# Patient Record
Sex: Male | Born: 1940 | Race: Black or African American | Hispanic: No | Marital: Single | Smoking: Former smoker
Health system: Southern US, Community
[De-identification: ages and names within clinical notes are randomized; demographics above are authoritative.]

## PROBLEM LIST (undated history)

## (undated) DIAGNOSIS — I10 Essential (primary) hypertension: Secondary | ICD-10-CM

## (undated) DIAGNOSIS — E785 Hyperlipidemia, unspecified: Secondary | ICD-10-CM

## (undated) DIAGNOSIS — N400 Enlarged prostate without lower urinary tract symptoms: Secondary | ICD-10-CM

## (undated) DIAGNOSIS — I639 Cerebral infarction, unspecified: Secondary | ICD-10-CM

## (undated) DIAGNOSIS — I2510 Atherosclerotic heart disease of native coronary artery without angina pectoris: Secondary | ICD-10-CM

## (undated) HISTORY — DX: Essential (primary) hypertension: I10

## (undated) HISTORY — DX: Benign prostatic hyperplasia without lower urinary tract symptoms: N40.0

## (undated) HISTORY — DX: Atherosclerotic heart disease of native coronary artery without angina pectoris: I25.10

## (undated) HISTORY — DX: Hyperlipidemia, unspecified: E78.5

## (undated) HISTORY — DX: Cerebral infarction, unspecified: I63.9

## (undated) HISTORY — PX: HERNIA REPAIR: SHX51

---

## 1999-05-18 HISTORY — PX: CORONARY ARTERY BYPASS GRAFT: SHX141

## 2001-12-12 ENCOUNTER — Emergency Department (HOSPITAL_COMMUNITY): Admission: EM | Admit: 2001-12-12 | Discharge: 2001-12-12 | Payer: Self-pay | Admitting: Emergency Medicine

## 2001-12-12 ENCOUNTER — Encounter: Payer: Self-pay | Admitting: Emergency Medicine

## 2002-03-21 ENCOUNTER — Encounter: Payer: Self-pay | Admitting: Neurology

## 2002-03-21 ENCOUNTER — Encounter: Payer: Self-pay | Admitting: Emergency Medicine

## 2002-03-21 ENCOUNTER — Inpatient Hospital Stay (HOSPITAL_COMMUNITY): Admission: EM | Admit: 2002-03-21 | Discharge: 2002-03-26 | Payer: Self-pay | Admitting: Emergency Medicine

## 2002-03-22 ENCOUNTER — Encounter: Payer: Self-pay | Admitting: Neurology

## 2002-03-22 ENCOUNTER — Encounter (INDEPENDENT_AMBULATORY_CARE_PROVIDER_SITE_OTHER): Payer: Self-pay | Admitting: Cardiology

## 2002-03-26 ENCOUNTER — Inpatient Hospital Stay (HOSPITAL_COMMUNITY)
Admission: RE | Admit: 2002-03-26 | Discharge: 2002-04-04 | Payer: Self-pay | Admitting: Physical Medicine & Rehabilitation

## 2002-06-07 ENCOUNTER — Encounter
Admission: RE | Admit: 2002-06-07 | Discharge: 2002-07-18 | Payer: Self-pay | Admitting: Physical Medicine & Rehabilitation

## 2002-12-10 ENCOUNTER — Encounter: Payer: Self-pay | Admitting: *Deleted

## 2002-12-10 ENCOUNTER — Inpatient Hospital Stay (HOSPITAL_COMMUNITY): Admission: EM | Admit: 2002-12-10 | Discharge: 2002-12-21 | Payer: Self-pay | Admitting: *Deleted

## 2002-12-15 ENCOUNTER — Encounter: Payer: Self-pay | Admitting: *Deleted

## 2002-12-17 ENCOUNTER — Encounter: Payer: Self-pay | Admitting: Surgery

## 2002-12-18 ENCOUNTER — Encounter: Payer: Self-pay | Admitting: Surgery

## 2002-12-19 ENCOUNTER — Encounter: Payer: Self-pay | Admitting: Surgery

## 2004-01-22 ENCOUNTER — Ambulatory Visit: Payer: Self-pay | Admitting: *Deleted

## 2004-04-01 ENCOUNTER — Ambulatory Visit: Payer: Self-pay | Admitting: Family Medicine

## 2004-07-02 ENCOUNTER — Ambulatory Visit: Payer: Self-pay | Admitting: Family Medicine

## 2004-07-02 ENCOUNTER — Ambulatory Visit: Payer: Self-pay | Admitting: *Deleted

## 2004-08-05 ENCOUNTER — Ambulatory Visit: Payer: Self-pay | Admitting: Family Medicine

## 2004-10-14 ENCOUNTER — Ambulatory Visit: Payer: Self-pay | Admitting: Family Medicine

## 2005-01-28 ENCOUNTER — Ambulatory Visit: Payer: Self-pay | Admitting: Family Medicine

## 2005-03-09 ENCOUNTER — Ambulatory Visit: Payer: Self-pay | Admitting: Family Medicine

## 2005-04-12 ENCOUNTER — Ambulatory Visit: Payer: Self-pay | Admitting: Family Medicine

## 2005-08-27 ENCOUNTER — Ambulatory Visit: Payer: Self-pay | Admitting: Family Medicine

## 2006-01-31 ENCOUNTER — Ambulatory Visit: Payer: Self-pay | Admitting: Family Medicine

## 2006-07-06 ENCOUNTER — Ambulatory Visit: Payer: Self-pay | Admitting: Family Medicine

## 2006-10-29 ENCOUNTER — Ambulatory Visit (HOSPITAL_COMMUNITY): Admission: RE | Admit: 2006-10-29 | Discharge: 2006-10-29 | Payer: Self-pay | Admitting: Chiropractic Medicine

## 2006-11-01 ENCOUNTER — Ambulatory Visit (HOSPITAL_COMMUNITY): Admission: RE | Admit: 2006-11-01 | Discharge: 2006-11-01 | Payer: Self-pay | Admitting: Chiropractic Medicine

## 2007-12-13 ENCOUNTER — Encounter (INDEPENDENT_AMBULATORY_CARE_PROVIDER_SITE_OTHER): Payer: Self-pay | Admitting: Family Medicine

## 2007-12-13 ENCOUNTER — Ambulatory Visit: Payer: Self-pay | Admitting: Internal Medicine

## 2007-12-13 LAB — CONVERTED CEMR LAB
ALT: 15 units/L (ref 0–53)
AST: 17 units/L (ref 0–37)
Alkaline Phosphatase: 70 units/L (ref 39–117)
Basophils Absolute: 0.1 10*3/uL (ref 0.0–0.1)
Basophils Relative: 1 % (ref 0–1)
CO2: 21 meq/L (ref 19–32)
Calcium: 9.4 mg/dL (ref 8.4–10.5)
Eosinophils Relative: 3 % (ref 0–5)
HDL: 43 mg/dL (ref >39)
Lymphocytes Relative: 38 % (ref 12–46)
PSA: 0.3 ng/mL (ref 0.10–4.00)
Platelets: 256 10*3/uL (ref 150–400)
RDW: 16.5 % — ABNORMAL HIGH (ref 11.5–15.5)
Sodium: 142 meq/L (ref 135–145)
Total Bilirubin: 0.3 mg/dL (ref 0.3–1.2)
Total CHOL/HDL Ratio: 4.8
Total Protein: 7.2 g/dL (ref 6.0–8.3)
VLDL: 29 mg/dL (ref 0–40)
WBC: 7 10*3/uL (ref 4.0–10.5)

## 2008-04-30 ENCOUNTER — Ambulatory Visit: Payer: Self-pay | Admitting: Internal Medicine

## 2008-06-07 ENCOUNTER — Encounter (INDEPENDENT_AMBULATORY_CARE_PROVIDER_SITE_OTHER): Payer: Self-pay | Admitting: Adult Health

## 2008-06-07 ENCOUNTER — Ambulatory Visit: Payer: Self-pay | Admitting: Internal Medicine

## 2008-06-07 LAB — CONVERTED CEMR LAB
ALT: 13 units/L (ref 0–53)
AST: 17 units/L (ref 0–37)
Albumin: 4.6 g/dL (ref 3.5–5.2)
Basophils Relative: 1 % (ref 0–1)
Calcium: 9.6 mg/dL (ref 8.4–10.5)
Cholesterol: 210 mg/dL — ABNORMAL HIGH (ref 0–200)
Creatinine, Ser: 0.91 mg/dL (ref 0.40–1.50)
Eosinophils Absolute: 0.1 10*3/uL (ref 0.0–0.7)
MCHC: 32.3 g/dL (ref 30.0–36.0)
MCV: 72 fL — ABNORMAL LOW (ref 78.0–100.0)
Monocytes Relative: 13 % — ABNORMAL HIGH (ref 3–12)
Neutrophils Relative %: 48 % (ref 43–77)
RBC: 6.49 M/uL — ABNORMAL HIGH (ref 4.22–5.81)
Sodium: 141 meq/L (ref 135–145)
Total Protein: 7.7 g/dL (ref 6.0–8.3)

## 2008-06-12 ENCOUNTER — Encounter (INDEPENDENT_AMBULATORY_CARE_PROVIDER_SITE_OTHER): Payer: Self-pay | Admitting: Internal Medicine

## 2008-08-20 ENCOUNTER — Ambulatory Visit: Payer: Self-pay | Admitting: Internal Medicine

## 2009-09-09 ENCOUNTER — Encounter (INDEPENDENT_AMBULATORY_CARE_PROVIDER_SITE_OTHER): Payer: Self-pay | Admitting: Adult Health

## 2009-09-09 ENCOUNTER — Ambulatory Visit: Payer: Self-pay | Admitting: Internal Medicine

## 2009-09-09 LAB — CONVERTED CEMR LAB
BUN: 17 mg/dL (ref 6–23)
Chloride: 103 meq/L (ref 96–112)
Creatinine, Ser: 0.99 mg/dL (ref 0.40–1.50)
Glucose, Bld: 82 mg/dL (ref 70–99)
HDL: 42 mg/dL (ref >39)
LDL Cholesterol: 165 mg/dL — ABNORMAL HIGH (ref 0–99)
PSA: 0.36 ng/mL (ref 0.10–4.00)
Potassium: 4.3 meq/L (ref 3.5–5.3)
Total CHOL/HDL Ratio: 5.3

## 2010-07-15 ENCOUNTER — Telehealth (INDEPENDENT_AMBULATORY_CARE_PROVIDER_SITE_OTHER): Payer: Self-pay | Admitting: *Deleted

## 2010-07-17 ENCOUNTER — Other Ambulatory Visit: Payer: Medicare Other

## 2010-07-17 ENCOUNTER — Encounter: Payer: Self-pay | Admitting: Internal Medicine

## 2010-07-17 ENCOUNTER — Ambulatory Visit (INDEPENDENT_AMBULATORY_CARE_PROVIDER_SITE_OTHER): Payer: Medicare Other | Admitting: Internal Medicine

## 2010-07-17 ENCOUNTER — Other Ambulatory Visit: Payer: Self-pay | Admitting: Internal Medicine

## 2010-07-17 DIAGNOSIS — N400 Enlarged prostate without lower urinary tract symptoms: Secondary | ICD-10-CM

## 2010-07-17 DIAGNOSIS — Z8679 Personal history of other diseases of the circulatory system: Secondary | ICD-10-CM

## 2010-07-17 DIAGNOSIS — I10 Essential (primary) hypertension: Secondary | ICD-10-CM

## 2010-07-17 DIAGNOSIS — E785 Hyperlipidemia, unspecified: Secondary | ICD-10-CM | POA: Insufficient documentation

## 2010-07-17 DIAGNOSIS — I2510 Atherosclerotic heart disease of native coronary artery without angina pectoris: Secondary | ICD-10-CM

## 2010-07-17 DIAGNOSIS — Z23 Encounter for immunization: Secondary | ICD-10-CM

## 2010-07-17 LAB — HEPATIC FUNCTION PANEL
ALT: 18 U/L (ref 0–53)
AST: 20 U/L (ref 0–37)
Albumin: 3.6 g/dL (ref 3.5–5.2)
Total Bilirubin: 0.4 mg/dL (ref 0.3–1.2)
Total Protein: 6.1 g/dL (ref 6.0–8.3)

## 2010-07-17 LAB — CBC WITH DIFFERENTIAL/PLATELET
Basophils Relative: 1.8 % (ref 0.0–3.0)
Eosinophils Relative: 3.6 % (ref 0.0–5.0)
HCT: 40.3 % (ref 39.0–52.0)
Hemoglobin: 12.9 g/dL — ABNORMAL LOW (ref 13.0–17.0)
Lymphs Abs: 2.6 10*3/uL (ref 0.7–4.0)
MCV: 74.3 fl — ABNORMAL LOW (ref 78.0–100.0)
Monocytes Absolute: 1.1 10*3/uL — ABNORMAL HIGH (ref 0.1–1.0)
Monocytes Relative: 14.8 % — ABNORMAL HIGH (ref 3.0–12.0)
Neutro Abs: 3.2 10*3/uL (ref 1.4–7.7)
Platelets: 270 10*3/uL (ref 150.0–400.0)
WBC: 7.3 10*3/uL (ref 4.5–10.5)

## 2010-07-17 LAB — URINALYSIS, ROUTINE W REFLEX MICROSCOPIC
Bilirubin Urine: NEGATIVE
Ketones, ur: NEGATIVE
Leukocytes, UA: NEGATIVE
Specific Gravity, Urine: 1.02 (ref 1.000–1.030)
Urine Glucose: NEGATIVE
Urobilinogen, UA: 0.2 (ref 0.0–1.0)

## 2010-07-17 LAB — CONVERTED CEMR LAB
HDL goal, serum: 40 mg/dL
LDL Goal: 70 mg/dL

## 2010-07-17 LAB — TSH: TSH: 0.57 u[IU]/mL (ref 0.35–5.50)

## 2010-07-17 LAB — BASIC METABOLIC PANEL
BUN: 8 mg/dL (ref 6–23)
Chloride: 112 mEq/L (ref 96–112)
GFR: 90.29 mL/min (ref >60.00)
Potassium: 4.6 mEq/L (ref 3.5–5.1)
Sodium: 146 mEq/L — ABNORMAL HIGH (ref 135–145)

## 2010-07-17 LAB — PSA: PSA: 0.45 ng/mL (ref 0.10–4.00)

## 2010-07-23 NOTE — Letter (Signed)
Summary: Lipid Letter  [facility]  [address] [place], [place] [address]   Phone: [phone]  Fax: [phone]    07/17/2010  Ian [name] [address] [place], [place]  [address]  Dear Ian [name]:  We have carefully reviewed your last lipid profile from 07/17/2010 and the results are noted below with a summary of recommendations for lipid management.    Cholesterol:       164     Goal: <200   HDL "good" Cholesterol:   13.24     Goal: >40   LDL "bad" Cholesterol:   96     Goal: <70   Triglycerides:       136.0     Goal: <150    you are mildly anemic, please follow-up soon!    TLC Diet (Therapeutic Lifestyle Change): Saturated Fats & Transfatty acids should be kept < 7% of total calories ***Reduce Saturated Fats Polyunstaurated Fat can be up to 10% of total calories Monounsaturated Fat Fat can be up to 20% of total calories Total Fat should be no greater than 25-35% of total calories Carbohydrates should be 50-60% of total calories Protein should be approximately 15% of total calories Fiber should be at least 20-30 grams a day ***Increased fiber may help lower LDL Total Cholesterol should be < 200mg /day Consider adding plant stanol/sterols to diet (example: Benacol spread) ***A higher intake of unsaturated fat may reduce Triglycerides and Increase HDL    Adjunctive Measures (may lower LIPIDS and reduce risk of Heart Attack) include: Aerobic Exercise (20-30 minutes 3-4 times a week) Limit Alcohol Consumption Weight Reduction Aspirin 75-81 mg a day by mouth (if not allergic or contraindicated) Dietary Fiber 20-30 grams a day by mouth     Current Medications: 1)    Lisinopril 20 Mg Tabs (Lisinopril) .Marland Kitchen.. 1 tablet once daily 2)    Hydrochlorothiazide 25 Mg Tabs (Hydrochlorothiazide) .Marland Kitchen.. 1 tablet once daily 3)    Plavix 75 Mg Tabs (Clopidogrel bisulfate) .Marland Kitchen.. 1 tablet once daily 4)    Carvedilol 6.25 Mg Tabs (Carvedilol) .Marland Kitchen.. 1 tablet two times a day 5)     Simvastatin 40 Mg Tabs (Simvastatin) .Marland Kitchen.. 1 tablet once a day 6)    Fluoxetine Hcl 20 Mg Tabs (Fluoxetine hcl) .Marland Kitchen.. 1 tablet once daily  If you have any questions, please call. We appreciate being able to work with you.   Sincerely,    [facility] Brenton Grills CMA (AAMA)

## 2010-07-23 NOTE — Progress Notes (Signed)
  Phone Note Other Incoming   Request: Send information Summary of Call: Received patients records from [facility], 46pgs sent to Dr. Yetta Barre.

## 2010-07-23 NOTE — Assessment & Plan Note (Signed)
Summary: new/medicare/#/cd   Vital Signs:  Patient profile:   70 year old male Height:      70 inches Weight:      208 pounds BMI:     29.95 O2 Sat:      97 % on Room air Temp:     97.8 degrees F oral Pulse rate:   52 / minute Pulse rhythm:   regular Resp:     16 per minute BP sitting:   138 / 80  (left arm) Cuff size:   large  Vitals Entered By: Bill Salinas CMA (July 17, 2010 2:22 PM)  Nutrition Counseling: Patient's BMI is greater than 25 and therefore counseled on weight management options.  O2 Flow:  Room air  Primary Care Provider:  Yetta Barre   History of Present Illness: New to me he comes in today for a routine f/up - he feels well and offers no complaints. He is with his daughter today and they tell me that he had a CABG about 10 years ago and he says that he has not had any cardiology f/up since then.   Hypertension History:      He denies headache, chest pain, palpitations, dyspnea with exertion, orthopnea, PND, peripheral edema, visual symptoms, neurologic problems, syncope, and side effects from treatment.        Positive major cardiovascular risk factors include male age 90 years old or older, hyperlipidemia, hypertension, and current tobacco user.  Negative major cardiovascular risk factors include no history of diabetes and negative family history for ischemic heart disease.        Positive history for target organ damage include ASHD (either angina/prior MI/prior CABG) and prior stroke (or TIA).  Further assessment for target organ damage reveals no history of cardiac end-organ damage (CHF/LVH), peripheral vascular disease, renal insufficiency, or hypertensive retinopathy.    Lipid Management History:      Positive NCEP/ATP III risk factors include male age 93 years old or older, current tobacco user, hypertension, ASHD (either angina/prior MI/prior CABG), and prior stroke (or TIA).  Negative NCEP/ATP III risk factors include non-diabetic, no family history for  ischemic heart disease, no peripheral vascular disease, and no history of aortic aneurysm.        The patient states that he knows about the "Therapeutic Lifestyle Change" diet.  His compliance with the TLC diet is not at all.  The patient expresses understanding of adjunctive measures for cholesterol lowering.  Adjunctive measures started by the patient include fiber, ASA, and weight reduction.  He expresses no side effects from his lipid-lowering medication.  The patient denies any symptoms to suggest myopathy or liver disease.      Preventive Screening-Counseling & Management  Alcohol-Tobacco     Alcohol drinks/day: 2     Alcohol type: spirits     >5/day in last 3 mos: no     Alcohol Counseling: to decrease amount and/or frequency of alcohol intake     Feels need to cut down: no     Feels annoyed by complaints: no     Feels guilty re: drinking: no     Needs 'eye opener' in am: no     Smoking Status: current     Smoking Cessation Counseling: yes     Smoke Cessation Stage: precontemplative     Packs/Day: 0.5     Year Started: 1964     Pack years: 20     Tobacco Counseling: to quit use of tobacco products  Caffeine-Diet-Exercise  Does Patient Exercise: no  Hep-HIV-STD-Contraception     Hepatitis Risk: no risk noted     HIV Risk: no risk noted     STD Risk: no risk noted      Drug Use:  no.    Current Medications (verified): 1)  Lisinopril 20 Mg Tabs (Lisinopril) .Marland Kitchen.. 1 Tablet Once Daily 2)  Hydrochlorothiazide 25 Mg Tabs (Hydrochlorothiazide) .Marland Kitchen.. 1 Tablet Once Daily 3)  Plavix 75 Mg Tabs (Clopidogrel Bisulfate) .Marland Kitchen.. 1 Tablet Once Daily 4)  Carvedilol 6.25 Mg Tabs (Carvedilol) .Marland Kitchen.. 1 Tablet Two Times A Day 5)  Simvastatin 40 Mg Tabs (Simvastatin) .Marland Kitchen.. 1 Tablet Once A Day 6)  Fluoxetine Hcl 20 Mg Tabs (Fluoxetine Hcl) .Marland Kitchen.. 1 Tablet Once Daily  Allergies (verified): No Known Drug Allergies  Past History:  Past Medical History: Coronary artery  disease Hyperlipidemia Hypertension Benign prostatic hypertrophy Cerebrovascular accident, hx of  Past Surgical History: Coronary artery bypass graft about 2001  Family History: Family History of Arthritis Family History Hypertension Family History of Stroke M 1st degree relative <50  Social History: Single Divorced Current Smoker Alcohol use-yes Drug use-no Regular exercise-no Smoking Status:  current Packs/Day:  0.5 Hepatitis Risk:  no risk noted HIV Risk:  no risk noted STD Risk:  no risk noted Drug Use:  no Does Patient Exercise:  no  Review of Systems       The patient complains of weight gain.  The patient denies anorexia, fever, weight loss, chest pain, syncope, dyspnea on exertion, peripheral edema, prolonged cough, headaches, hemoptysis, abdominal pain, melena, hematochezia, severe indigestion/heartburn, hematuria, genital sores, muscle weakness, suspicious skin lesions, transient blindness, difficulty walking, depression, unusual weight change, abnormal bleeding, enlarged lymph nodes, angioedema, and testicular masses.   GU:  Denies decreased libido, discharge, dysuria, erectile dysfunction, genital sores, hematuria, incontinence, nocturia, urinary frequency, and urinary hesitancy.  Physical Exam  General:  alert, well-developed, well-nourished, well-hydrated, appropriate dress, normal appearance, healthy-appearing, cooperative to examination, good hygiene, and overweight-appearing.   Head:  normocephalic, atraumatic, no abnormalities observed, and no abnormalities palpated.   Eyes:  vision grossly intact, pupils equal, and pupils round.   Ears:  External ear exam shows no significant lesions or deformities.  Otoscopic examination reveals clear canals, tympanic membranes are intact bilaterally without bulging, retraction, inflammation or discharge. Hearing is grossly normal bilaterally. Nose:  External nasal examination shows no deformity or inflammation. Nasal  mucosa are pink and moist without lesions or exudates. Mouth:  Oral mucosa and oropharynx without lesions or exudates.  Teeth in good repair. Neck:  supple, full ROM, no masses, no thyromegaly, no thyroid nodules or tenderness, no JVD, normal carotid upstroke, no carotid bruits, no cervical lymphadenopathy, and no neck tenderness.   Chest Wall:  sternotomy scar.   Lungs:  normal respiratory effort, no intercostal retractions, no accessory muscle use, normal breath sounds, no dullness, no fremitus, no crackles, and no wheezes.   Heart:  normal rate, regular rhythm, no murmur, no gallop, no rub, and no JVD.   Abdomen:  soft, non-tender, normal bowel sounds, no distention, no masses, no guarding, no rigidity, no rebound tenderness, no abdominal hernia, no inguinal hernia, no hepatomegaly, and no splenomegaly.   Msk:  No deformity or scoliosis noted of thoracic or lumbar spine.   Pulses:  R and L carotid,radial,femoral,dorsalis pedis and posterior tibial pulses are full and equal bilaterally Extremities:  No clubbing, cyanosis, edema, or deformity noted with normal full range of motion of all joints.   Neurologic:  No  cranial nerve deficits noted. Station and gait are normal. Plantar reflexes are down-going bilaterally. DTRs are symmetrical throughout. Sensory, motor and coordinative functions appear intact. Skin:  Intact without suspicious lesions or rashes Cervical Nodes:  No lymphadenopathy noted Axillary Nodes:  No palpable lymphadenopathy Psych:  Oriented X3, normally interactive, good eye contact, not anxious appearing, not depressed appearing, not agitated, not suicidal, easily distracted, poor concentration, and memory impairment.     Impression & Recommendations:  Problem # 1:  HYPERTENSION (ICD-401.9) Assessment Improved  His updated medication list for this problem includes:    Lisinopril 20 Mg Tabs (Lisinopril) .Marland Kitchen... 1 tablet once daily    Hydrochlorothiazide 25 Mg Tabs  (Hydrochlorothiazide) .Marland Kitchen... 1 tablet once daily    Carvedilol 6.25 Mg Tabs (Carvedilol) .Marland Kitchen... 1 tablet two times a day  Orders: Venipuncture (16109) TLB-Lipid Panel (80061-LIPID) TLB-BMP (Basic Metabolic Panel-BMET) (80048-METABOL) TLB-CBC Platelet - w/Differential (85025-CBCD) TLB-Hepatic/Liver Function Pnl (80076-HEPATIC) TLB-TSH (Thyroid Stimulating Hormone) (84443-TSH) TLB-PSA (Prostate Specific Antigen) (84153-PSA) TLB-Udip w/ Micro (81001-URINE)  BP today: 138/80  10 Yr Risk Heart Disease: N/A  Labs Reviewed: K+: 4.3 (09/09/2009) Creat: : 0.99 (09/09/2009)   Chol: 221 (09/09/2009)   HDL: 42 (09/09/2009)   LDL: 165 (09/09/2009)   TG: 72 (09/09/2009)  Problem # 2:  CORONARY ARTERY DISEASE (ICD-414.00) Assessment: Unchanged  His updated medication list for this problem includes:    Lisinopril 20 Mg Tabs (Lisinopril) .Marland Kitchen... 1 tablet once daily    Hydrochlorothiazide 25 Mg Tabs (Hydrochlorothiazide) .Marland Kitchen... 1 tablet once daily    Plavix 75 Mg Tabs (Clopidogrel bisulfate) .Marland Kitchen... 1 tablet once daily    Carvedilol 6.25 Mg Tabs (Carvedilol) .Marland Kitchen... 1 tablet two times a day  Orders: Venipuncture (60454) TLB-Lipid Panel (80061-LIPID) TLB-BMP (Basic Metabolic Panel-BMET) (80048-METABOL) TLB-CBC Platelet - w/Differential (85025-CBCD) TLB-Hepatic/Liver Function Pnl (80076-HEPATIC) TLB-TSH (Thyroid Stimulating Hormone) (84443-TSH) TLB-PSA (Prostate Specific Antigen) (84153-PSA) TLB-Udip w/ Micro (81001-URINE) Cardiology Referral (Cardiology)  Labs Reviewed: Chol: 221 (09/09/2009)   HDL: 42 (09/09/2009)   LDL: 165 (09/09/2009)   TG: 72 (09/09/2009)  Lipid Goals: Chol Goal: 200 (07/17/2010)   HDL Goal: 40 (07/17/2010)   LDL Goal: 70 (07/17/2010)   TG Goal: 150 (07/17/2010)  Problem # 3:  BENIGN PROSTATIC HYPERTROPHY (ICD-600.00) Assessment: Unchanged  Orders: Venipuncture (09811) TLB-Lipid Panel (80061-LIPID) TLB-BMP (Basic Metabolic Panel-BMET) (80048-METABOL) TLB-CBC Platelet -  w/Differential (85025-CBCD) TLB-Hepatic/Liver Function Pnl (80076-HEPATIC) TLB-TSH (Thyroid Stimulating Hormone) (84443-TSH) TLB-PSA (Prostate Specific Antigen) (84153-PSA) TLB-Udip w/ Micro (81001-URINE)  PSA: 0.36 (09/09/2009)     Problem # 4:  HYPERLIPIDEMIA (ICD-272.4) Assessment: Unchanged  His updated medication list for this problem includes:    Simvastatin 40 Mg Tabs (Simvastatin) .Marland Kitchen... 1 tablet once a day  Orders: Venipuncture (91478) TLB-Lipid Panel (80061-LIPID) TLB-BMP (Basic Metabolic Panel-BMET) (80048-METABOL) TLB-CBC Platelet - w/Differential (85025-CBCD) TLB-Hepatic/Liver Function Pnl (80076-HEPATIC) TLB-TSH (Thyroid Stimulating Hormone) (84443-TSH) TLB-PSA (Prostate Specific Antigen) (84153-PSA) TLB-Udip w/ Micro (81001-URINE)  Labs Reviewed: SGOT: 18 (09/09/2009)   SGPT: 12 (09/09/2009)  Lipid Goals: Chol Goal: 200 (07/17/2010)   HDL Goal: 40 (07/17/2010)   LDL Goal: 70 (07/17/2010)   TG Goal: 150 (07/17/2010)  10 Yr Risk Heart Disease: N/A   HDL:42 (09/09/2009), 41 (06/07/2008)  LDL:165 (09/09/2009), 149 (06/07/2008)  Chol:221 (09/09/2009), 210 (06/07/2008)  Trig:72 (09/09/2009), 99 (06/07/2008)  Complete Medication List: 1)  Lisinopril 20 Mg Tabs (Lisinopril) .Marland Kitchen.. 1 tablet once daily 2)  Hydrochlorothiazide 25 Mg Tabs (Hydrochlorothiazide) .Marland Kitchen.. 1 tablet once daily 3)  Plavix 75 Mg Tabs (Clopidogrel bisulfate) .Marland Kitchen.. 1 tablet once daily 4)  Carvedilol 6.25 Mg Tabs (Carvedilol) .Marland Kitchen.. 1 tablet two times a day 5)  Simvastatin 40 Mg Tabs (Simvastatin) .Marland Kitchen.. 1 tablet once a day 6)  Fluoxetine Hcl 20 Mg Tabs (Fluoxetine hcl) .Marland Kitchen.. 1 tablet once daily  Other Orders: Tdap => 54yrs IM (16109) Pneumococcal Vaccine (60454) Admin 1st Vaccine (09811) Admin of Any Addtl Vaccine (91478)  Hypertension Assessment/Plan:      The patient's hypertensive risk group is category C: Target organ damage and/or diabetes.  Today's blood pressure is 138/80.    Lipid  Assessment/Plan:      Based on NCEP/ATP III, the patient's risk factor category is "history of coronary disease, peripheral vascular disease, cerebrovascular disease, or aortic aneurysm along with either diabetes, current smoker, or LDL > 130 plus HDL < 40 plus triglycerides > 200".  The patient's lipid goals are as follows: Total cholesterol goal is 200; LDL cholesterol goal is 70; HDL cholesterol goal is 40; Triglyceride goal is 150.    Colorectal Screening:  Current Recommendations:    Hemoccult: patient refused    Colonoscopy recommended: patient defers today but will consider in the future  Colonoscopy Results:    Date of Exam: 07/24/2002    Results: Normal  Colonoscopy Comments:    somewhere in [place]  PSA Screening:    PSA: 0.36  (09/09/2009)    Reviewed PSA screening recommendations: PSA ordered  Immunization & Chemoprophylaxis:    Tetanus vaccine: Tdap  (07/17/2010)    Influenza vaccine: Historical  (02/24/2010)    Pneumovax: Pneumovax  (07/17/2010)  Patient Instructions: 1)  Please schedule a follow-up appointment in 1 month. 2)  Tobacco is very bad for your health and your loved ones! You Should stop smoking!. 3)  Stop Smoking Tips: Choose a Quit date. Cut down before the Quit date. decide what you will do as a substitute when you feel the urge to smoke(gum,toothpick,exercise). 4)  It is not healthy  for men to drink more than 2-3 drinks per day or for women to drink more than 1-2 drinks per day. 5)  Check your Blood Pressure regularly. If it is above 130/80: you should make an appointment. Prescriptions: FLUOXETINE HCL 20 MG TABS (FLUOXETINE HCL) 1 tablet once daily  #30 x 11   Entered and Authorized by:   Etta Grandchild MD   Signed by:   Etta Grandchild MD on 07/17/2010   Method used:   Electronically to        [facility] (retail)       [address]       [place], [place]  [address]       Ph: [phone]       Fax: [phone]    RxID:   [id number] SIMVASTATIN 40 MG TABS (SIMVASTATIN) 1 tablet once a day  #30 x 11   Entered and Authorized by:   Etta Grandchild MD   Signed by:   Etta Grandchild MD on 07/17/2010   Method used:   Electronically to        [facility] (retail)       [address]       [place], [place]  [address]       Ph: [phone]       Fax: [phone]   RxID:   [id number] CARVEDILOL 6.25 MG TABS (CARVEDILOL) 1 tablet two times a day  #60  x 11   Entered and Authorized by:   Etta Grandchild MD   Signed by:   Etta Grandchild MD on 07/17/2010   Method used:   Electronically to        [facility] (retail)       [address]       [place], [place]  [address]       Ph: [phone]       Fax: [phone]   RxID:   [id number] PLAVIX 75 MG TABS (CLOPIDOGREL BISULFATE) 1 tablet once daily  #30 x 11   Entered and Authorized by:   Etta Grandchild MD   Signed by:   Etta Grandchild MD on 07/17/2010   Method used:   Electronically to        [facility] (retail)       [address]       [place], [place]  [address]       Ph: [phone]       Fax: [phone]   RxID:   [id number] HYDROCHLOROTHIAZIDE 25 MG TABS (HYDROCHLOROTHIAZIDE) 1 tablet once daily  #30 x 11   Entered and Authorized by:   Etta Grandchild MD   Signed by:   Etta Grandchild MD on 07/17/2010   Method used:   Electronically to        [facility] (retail)       [address]       [place], [place]  [address]       Ph: [phone]       Fax: [phone]   RxID:   [id number] LISINOPRIL 20 MG TABS (LISINOPRIL) 1 tablet once daily  #30 x 11   Entered and Authorized by:   Etta Grandchild MD   Signed by:   Etta Grandchild MD on 07/17/2010   Method used:   Electronically to        [facility] (retail)       [address]       [place], [place]   [address]       Ph: [phone]       Fax: [phone]   RxID:   [id number]    Orders Added: 1)  Venipuncture [58527] 2)  TLB-Lipid Panel [80061-LIPID] 3)  TLB-BMP (Basic Metabolic Panel-BMET) [80048-METABOL] 4)  TLB-CBC Platelet - w/Differential [85025-CBCD] 5)  TLB-Hepatic/Liver Function Pnl [80076-HEPATIC] 6)  TLB-TSH (Thyroid Stimulating Hormone) [84443-TSH] 7)  TLB-PSA (Prostate Specific Antigen) [84153-PSA] 8)  TLB-Udip w/ Micro [81001-URINE] 9)  Cardiology Referral [Cardiology] 10)  Tdap => 59yrs IM [90715] 11)  Pneumococcal Vaccine [90732] 12)  Admin 1st Vaccine [90471] 13)  Admin of Any Addtl Vaccine [90472] 14)  New Patient Level IV [78242]   Immunization History:  Influenza Immunization History:    Influenza:  historical (02/24/2010)  Immunizations Administered:  Tetanus Vaccine:    Vaccine Type: Tdap    Site: left deltoid    Mfr: GlaxoSmithKline    Dose: 0.5 ml    Route: IM    Given by: Ami Bullins CMA    Exp. Date: 03/05/2012    Lot #: [id number]    VIS given: 04/03/08 version given July 17, 2010.  Pneumonia Vaccine:    Vaccine Type:  Pneumovax    Site: right deltoid    Mfr: Merck    Dose: 0.5 ml    Route: IM    Given by: Ami Bullins CMA    Exp. Date: 12/11/2011    Lot #: [id number]    VIS given: 04/21/09 version given July 17, 2010.   Immunization History:  Influenza Immunization History:    Influenza:  Historical (02/24/2010)  Immunizations Administered:  Tetanus Vaccine:    Vaccine Type: Tdap    Site: left deltoid    Mfr: GlaxoSmithKline    Dose: 0.5 ml    Route: IM    Given by: Ami Bullins CMA    Exp. Date: 03/05/2012    Lot #: [id number]    VIS given: 04/03/08 version given July 17, 2010.  Pneumonia Vaccine:    Vaccine Type: Pneumovax    Site: right deltoid    Mfr: Merck    Dose: 0.5 ml    Route: IM    Given by: Ami Bullins CMA    Exp. Date: 12/11/2011    Lot #: [id number]    VIS given: 04/21/09 version given July 17, 2010.

## 2010-08-05 ENCOUNTER — Encounter: Payer: Self-pay | Admitting: Cardiovascular Disease

## 2010-08-17 ENCOUNTER — Institutional Professional Consult (permissible substitution): Payer: Medicare Other | Admitting: Cardiovascular Disease

## 2010-08-17 ENCOUNTER — Telehealth: Payer: Self-pay | Admitting: *Deleted

## 2010-08-17 MED ORDER — SIMVASTATIN 40 MG PO TABS: 40.0000 mg | ORAL_TABLET | Freq: Every day | ORAL | Status: AC

## 2010-08-17 MED ORDER — FLUOXETINE HCL 20 MG PO TABS: 20.0000 mg | ORAL_TABLET | Freq: Every day | ORAL | Status: AC

## 2010-08-17 MED ORDER — LISINOPRIL 20 MG PO TABS: 20.0000 mg | ORAL_TABLET | Freq: Every day | ORAL | Status: DC

## 2010-08-17 MED ORDER — HYDROCHLOROTHIAZIDE 25 MG PO TABS: 25.0000 mg | ORAL_TABLET | Freq: Every day | ORAL | Status: DC

## 2010-08-17 MED ORDER — CLOPIDOGREL BISULFATE 75 MG PO TABS: 75.0000 mg | ORAL_TABLET | Freq: Every day | ORAL | Status: DC

## 2010-08-17 MED ORDER — CARVEDILOL 6.25 MG PO TABS: 6.2500 mg | ORAL_TABLET | Freq: Two times a day (BID) | ORAL | Status: DC

## 2010-08-17 NOTE — Telephone Encounter (Signed)
yes

## 2010-08-17 NOTE — Telephone Encounter (Signed)
Patient requesting rf's on all meds to go to [facility]. Pt is out of meds and needs today. OK for rfs?

## 2010-08-28 ENCOUNTER — Ambulatory Visit: Payer: Medicare Other | Admitting: Internal Medicine

## 2010-09-17 ENCOUNTER — Ambulatory Visit (INDEPENDENT_AMBULATORY_CARE_PROVIDER_SITE_OTHER): Payer: Medicare Other | Admitting: Cardiovascular Disease

## 2010-09-17 ENCOUNTER — Encounter: Payer: Self-pay | Admitting: Cardiovascular Disease

## 2010-09-17 VITALS — BP 144/80 | HR 50 | Ht 69.0 in | Wt 196.0 lb

## 2010-09-17 DIAGNOSIS — I10 Essential (primary) hypertension: Secondary | ICD-10-CM

## 2010-09-17 DIAGNOSIS — I2510 Atherosclerotic heart disease of native coronary artery without angina pectoris: Secondary | ICD-10-CM

## 2010-09-17 DIAGNOSIS — Z720 Tobacco use: Secondary | ICD-10-CM

## 2010-09-17 DIAGNOSIS — F17200 Nicotine dependence, unspecified, uncomplicated: Secondary | ICD-10-CM

## 2010-09-17 MED ORDER — ASPIRIN EC 81 MG PO TBEC: 81.0000 mg | DELAYED_RELEASE_TABLET | Freq: Every day | ORAL | Status: AC

## 2010-09-17 MED ORDER — AMLODIPINE BESYLATE 5 MG PO TABS: 5.0000 mg | ORAL_TABLET | Freq: Every day | ORAL | Status: DC

## 2010-09-17 MED ORDER — NITROGLYCERIN 0.4 MG SL SUBL: 0.4000 mg | SUBLINGUAL_TABLET | SUBLINGUAL | Status: DC | PRN

## 2010-09-17 NOTE — Assessment & Plan Note (Addendum)
Stable. Continue beta blocker, Ace-inh, statin. Lower dose of ASA to 81 mg per day. Will give NTG prn.

## 2010-09-17 NOTE — Assessment & Plan Note (Signed)
Complete smoking cessation encouraged. 

## 2010-09-17 NOTE — Assessment & Plan Note (Signed)
BP elevated. Will add Norvasc 5 mg po Qdaily.

## 2010-09-17 NOTE — Patient Instructions (Signed)
Your physician has recommended you make the following change in your medication:  1) Start Norvasc (amlodipine) 5mg  one tablet daily. 2) Decrease Aspirin to 81mg  one tablet daily. 3) A prescription has also been sent to your pharmacy for Nitroglycerin tablets to take as needed.  Your physician wants you to follow-up in: 6 months. You will receive a reminder letter in the mail two months in advance. If you don't receive a letter, please call our office to schedule the follow-up appointment.

## 2010-09-17 NOTE — Progress Notes (Signed)
History of Present Illness:Ian [name] with history of CAD s/p 3 V CABG per Dr. Laneta Simmers 2004,  HTN, Hyperlipidemia, CVA and BPH here today to establish with a cardiologist. He has not had any cardiac follow up since his bypass surgery. He was recently seen by Dr. Sanda Linger and referred to our office. He has been feeling well. He denies any chest pain, SOB, dizziness, near syncope or syncope. He continues to smoke at least several cigarettes daily. He lives alone. He is estranged from all three kids who has not seen since they were babies. No family in this area.    Past Medical History  Diagnosis Date  . CAD (coronary artery disease)   . HLD (hyperlipidemia)   . HTN (hypertension)   . BPH (benign prostatic hyperplasia)   . Stroke     Past Surgical History  Procedure Date  . Coronary artery bypass graft 2001    Current Outpatient Prescriptions  Medication Sig Dispense Refill  . carvedilol (COREG) 6.25 MG tablet Take 1 tablet (6.25 mg total) by mouth 2 (two) times daily with a meal.  90 tablet  3  . clopidogrel (PLAVIX) 75 MG tablet Take 1 tablet (75 mg total) by mouth daily.  90 tablet  3  . FLUoxetine (PROZAC) 20 MG tablet Take 1 tablet (20 mg total) by mouth daily.  90 tablet  3  . hydrochlorothiazide 25 MG tablet Take 1 tablet (25 mg total) by mouth daily.  90 tablet  3  . lisinopril (PRINIVIL,ZESTRIL) 20 MG tablet Take 1 tablet (20 mg total) by mouth daily.  90 tablet  3  . simvastatin (ZOCOR) 40 MG tablet Take 1 tablet (40 mg total) by mouth at bedtime.  90 tablet  3    Allergies not on file  History   Social History  . Marital Status: Single    Spouse Name: N/A    Number of Children: N/A  . Years of Education: N/A   Occupational History  . Not on file.   Social History Main Topics  . Smoking status: Current Everyday Smoker  . Smokeless tobacco: Not on file  . Alcohol Use: Yes  . Drug Use: No  . Sexually Active: Not on file   Other Topics Concern  . Not on file    Social History Narrative  . No narrative on file    Family History  Problem Relation Age of Onset  . Arthritis    . Hypertension    . Stroke    . Heart attack Mother   . Heart attack Father     Review of Systems:  As stated in the HPI and otherwise negative.   BP 144/80  Pulse 50  Ht 5\' 9"  (1.753 m)  Wt 196 lb (88.905 kg)  BMI 28.94 kg/m2  Physical Examination: General: Well developed, well nourished, NAD HEENT: OP clear, mucus membranes moist SKIN: warm, dry. No rashes. Neuro: No focal deficits Musculoskeletal: Muscle strength 5/5 all ext Psychiatric: Mood and affect normal Neck: No JVD, no carotid bruits, no thyromegaly, no lymphadenopathy. Lungs:Clear bilaterally, no wheezes, rhonci, crackles Cardiovascular: Regular rate and rhythm. No murmurs, gallops or rubs. Abdomen:Soft. Bowel sounds present. Non-tender.  Extremities: No lower extremity edema. Pulses are 2 + in the bilateral DP/PT.  WJX:BJYNW bradycardia, rate 50 bpm. LAD.

## 2010-10-02 NOTE — Discharge Summary (Signed)
NAMELAQUINTON, BIHM NO.:  [id number]   MEDICAL RECORD NO.:  [id number]                   PATIENT TYPE:  IPS   LOCATION:  4035                                 FACILITY:  [facility]   PHYSICIAN:  Ellwood Dense, M.D.                DATE OF BIRTH:  Sep 16, 1940   DATE OF ADMISSION:  03/26/2002  DATE OF DISCHARGE:  04/04/2002                                 DISCHARGE SUMMARY   DISCHARGE DIAGNOSES:  1. Left middle cerebral artery infarct.  2. Tobacco abuse.  3. Hypertension.   HISTORY OF PRESENT ILLNESS:  The patient is a 70 year old right-handed black  male with past medical history of hypertension, tobacco abuse and alcohol  abuse admitted on 03/21/02 with aphasia and right-sided weakness.  Head CT  revealed an old left MCA infarct without any acute abnormality.  MRI  revealed an acute and chronic infarct along the left MCA distribution with  atrophy.  Carotid Dopplers demonstrated no evidence of ICA stenosis.  Echocardiogram was performed and revealed ejection fraction of 55 to 65%  without any source of embolus.  The patient is presently on aspirin and HCTZ  for blood pressure.  PT report at this time indicates the patient is  ambulating mid assist 12 feet +2 with left knee buckling.  Transfer sit  stand minimum guard assist.   HOSPITAL COURSE:  Significant for elevated blood pressure.  The patient was  transferred to [facility] Rehab Department on 03/26/02.   PAST MEDICAL HISTORY:  Significant for above. Denies any cardiovascular  disease or diabetes mellitus.   PAST SURGICAL HISTORY:  Significant for hernia repair.   MEDICATIONS PRIOR TO ADMISSION:  1. Bextra 20 mg p.o. q.d.  2. Ibuprofen 80 mg p.o. q.d.  3. Blood pressure medication, unknown doses and names.   PRIMARY CARE Keion Neels:  [facility].   REVIEW OF SYSTEMS:  Significant for shortness of breath  and sprained ankle.   SOCIAL HISTORY:  The patient lives alone in [place].   Independent prior  to admission with jobs.  He lives in a one-level home.  He does not have any  children.  He has good support with family and friends.  [name] may be able  to assist the patient  after discharge.  He is still driving and smokes  approximately  1 1/2 packs daily.  Has four sisters and four brothers.  Has  10th grade education.   FAMILY HISTORY:  Mother is deceased of unknown cause.  Father deceased  unknown cause.   ALLERGIES:  None.   HOSPITAL COURSE:  The patient was admitted to [facility] Rehab Department on  03/26/02 for comprehensive patient rehabilitation where he received more  than three hours of therapy daily.  His hospital course significant for the  following:  1. Left MCA CVA.  Overall, the patient progressed very well while in rehab.     He  had no new significant neurological symptoms that occurred.  He     remained on aspirin 325 mg p.o. q.d. for CVA prophylaxis.  The patient     was able to tolerate therapies very well.  He was discharged at a     modified independently with physical therapy but the patient still     requires supervision due to decreased cognition and decrease in     awareness.  2. History of hypertension.  At the time of admission, the patient was     placed on Clonidine 0.2 mg p.o. b.i.d. as well as HCTZ 25 mg p.o. q.d.     for hypertension.  During rehab day #1, the patient did experience a     hypertensive episode.  Therefore, Clonidine doses were adjusted.  He was     placed on 0.1 mg p.o. b.i.d. on 03/27/02.  After his adjustment in     medications, his blood pressure remained under fair control.  His     systolic blood pressures ranged from the high 140s and diastolic blood     pressure most ranging from the low 70s to upper 90s.  No other adjustment     in medications was made.  He was placed on a no salt added diet on     04/02/02.  3. Tobacco abuse.  The patient had a history of tobacco abuse prior to     admission.  He was  placed on nicotine patch 14 mg 24 patch throughout his     entire stay in rehab.   Besides occasional flatus and gas, there were no other new medical symptoms  or medical issues that occurred while in rehab.  He was started on Protonix  40 mg p.o. q.d. as well as Maalox 10 cc p.o. t.i.d. p.r.n.  The patient did  go on a day pass on 03/29/02 with family with good success.   Latest labs indicate that latest urine culture was from 03/26/02.  No growth  x1 day.  Latest hemoglobin 14.4 and hematocrit 43.7, white blood cell count  9.5, platelet 310.  He had one occult blood performed on stools which was  negative.  Latest sodium 135, potassium 3.9, chloride 97, CO2 29, glucose  100, BUN 8, creatinine 1.0, AST 25, ALT 28.  At time of discharge, blood  pressure  was slightly elevated.  It was 148/100.  Pulse was 88.  Temperature was 98.7.  Respiratory rate 20.  PT report indicates the patient  can ambulate approximately greater than 200 feet, modified independently  with cane.  The patient could stand up independently and bed mobility  modified independently.  He can perform most ADLs modified independently.  Overall, the patient made good progress in rehab.  He still had some  decreased awareness and decreased memory and cognitive issues. The patient  is discharged home with fiancee.   DISCHARGE MEDICATIONS:  1. Clonidine 0.1 mg one tablet twice daily.  2. HCTZ 25 mg one tablet daily.  3. Aspirin 325 mg daily.  4. Nicotine patch daily.   ACTIVITY:  No drinking.  No driving.  No smoking.  Use cane.   DIET:  Includes low salt.  He will have advanced home health care for PT, OT, and speech.  He will  follow up with [facility] within four weeks for blood pressure.  He will  call for appointment.  Follow with Dr. Ellwood Dense 05/16/02 11:30.  This  is a Wednesday.  Junie Bame, P.A.                       Ellwood Dense, M.D.   LH/MEDQ  D:  04/04/2002  T:  04/04/2002  Job:   [id number]

## 2010-10-02 NOTE — Discharge Summary (Signed)
NAME:  MIR, FULLILOVE NO.:  [id number]   MEDICAL RECORD NO.:  [id number]                   PATIENT TYPE:  INP   LOCATION:  2013                                 FACILITY:  [facility]   PHYSICIAN:  Evelene Croon, M.D.                  DATE OF BIRTH:  May 17, 1941   DATE OF ADMISSION:  12/10/2002  DATE OF DISCHARGE:  12/21/2002                                 DISCHARGE SUMMARY   HISTORY OF PRESENT ILLNESS:  Mr. Dosher is a very pleasant 70 year old  black male with a history of hypertension and a cerebrovascular accident in  November 2003 as well as alcohol abuse who presented to the hospital  emergency room with acute coronary syndrome.  He awoke from sleep  approximately 3 a.m. on the date of admission with acute onset of midsternal  chest pain which he rated as 10 on a scale of 1-10.  There was no radiation,  shortness of breath, diaphoresis, nausea, or vomiting.  He drove himself to  the emergency room and initial EKG showed marked ST depression in the  inferior leads as well as precordial leads from V2-V4.  He was given  aspirin, nitroglycerin, heparin and this resolved the chest pain as well as  ST depression.  He was felt to require further evaluation and cardiology  consultation was obtained and he was admitted to the hospital.   PAST MEDICAL HISTORY:  1. CVA with residual right-sided weakness.  2. Hypertension, poorly controlled.  3. Alcohol abuse.  4. Tobacco abuse.  5. Medical noncompliance.  6. History of umbilical hernia repair.   MEDICATIONS ON ADMISSION:  Aspirin and Plavix, although he ran out of these  two weeks ago.   ALLERGIES:  No known drug allergies.   FAMILY HISTORY:  Please see the history and physical done at time of  admission.   SOCIAL HISTORY:  Please see the history and physical done at time of  admission.   REVIEW OF SYSTEMS:  Please see the history and physical done at time of  admission.   PHYSICAL EXAMINATION:   Please see the history and physical done at time of  admission.   HOSPITAL COURSE:  The patient was admitted with acute coronary syndrome,  placed on aspirin, heparin, Integrilin, ACE inhibitor as well as beta  blocker, Statin, and nitroglycerin and scheduled for cardiac catheterization  which was undertaken on December 11, 2002 by Rollene Rotunda, M.D.  Multivessel  coronary artery disease was noted and subsequently cardiac surgery  consultation was obtained with Evelene Croon, M.D. who evaluated patient and  his studies and recommended surgical revascularization procedure.  On December 17, 2002 the patient underwent the following procedure:  Coronary artery  bypass grafting x3.  The following grafts were placed:  Left internal  mammary artery to the obtuse marginal branch #1, saphenous vein graft to the  obtuse marginal branch #2, saphenous  vein graft to the right coronary  artery.  The patient tolerated the procedure well.  Was taken to the  surgical intensive care unit in stable condition.  Postoperative hospital  course:  The patient has done well.  Initial ICU stay was unremarkable.  All  routine lines, monitors, and chest tube drainage devices were discontinued  in a routine manner.  The patient was transferred to the 2000 telemetry unit  on postoperative day #1.   The patient has done well on 2000.  He has responded well to cardiac  rehabilitation phase I modalities commensurate for level of postoperative  convalescence.  He has maintained normal sinus rhythm.  Oxygen has been  weaned and he maintains good saturations on room air.  He has undergone a  gentle diuresis.  He is responding well to pulmonary toilet and overall it  is felt that tentatively he is stable for discharge in the morning of December 21, 2002 pending morning round reevaluation.   DISCHARGE MEDICATIONS:  1. Aspirin 325 mg daily.  2. Plavix 75 mg daily.  3. Lopressor 25 mg b.i.d.  4. Zocor 80 mg q.h.s.  5. Tylox one  or two q.4-6h. as needed for pain.   DISCHARGE INSTRUCTIONS:  The patient received written instructions in regard  to medications, activity, diet, wound care, and follow-up.  Follow-up will  include Evelene Croon, M.D. on Tuesday, August 31 at 11 a.m.  He will see the  cardiologist.  They will arrange two weeks.   FINAL DIAGNOSES:  1. Severe coronary artery disease as described above.  2. History of cerebrovascular accident.  3. History of hypertension.  4. History of alcohol abuse.  5. History of tobacco abuse.  6. History of medical noncompliance.  7. History of umbilical hernia repair.   CONDITION ON DISCHARGE:  Stable and improving.      Rowe Clack, P.A.-C.                    Evelene Croon, M.D.    Sherryll Burger  D:  12/20/2002  T:  12/21/2002  Job:  [id number]   cc:   Audley Hose, M.D.

## 2010-10-02 NOTE — Discharge Summary (Signed)
[name], Ian [name] NO.:  [id number]   MEDICAL RECORD NO.:  [id number]                   PATIENT TYPE:  IPS   LOCATION:  4035                                 FACILITY:  [facility]   PHYSICIAN:  Ellwood Dense, M.D.                DATE OF BIRTH:  April 10, 1941   DATE OF ADMISSION:  03/26/2002  DATE OF DISCHARGE:  04/04/2002                                 DISCHARGE SUMMARY   DISCHARGE DIAGNOSES:  1. Left MCA infarct.  2. Tobacco abuse.  3. Hypertension.   HISTORY OF PRESENT ILLNESS:  The patient is a 70 year old right-handed black  male with past medical history of hypertension, tobacco abuse, and alcohol  abuse admitted on 11/5 with aphasia and right-sided weakness.  Head CT  revealed old left inferior infarct and no acute abnormality.  MRI was  performed which acute and chronic infarct along the left MCA distribution  and atrophy.  Carotid Dopplers demonstrate no evidence of ICA stenosis.  Echocardiogram was performed which demonstrated ejection fraction of 55 to  65% without any source of embolus found. The patient is presently on aspirin  and HCTZ for blood pressure.  PVT report at this time indicates the patient  is ambulating approximately mid assist 12 feet +2 with left knee buckling,  contractions, extension, and _____ assist.   HOSPITAL COURSE:  Significant for elevated blood pressure.  The patient was  transferred to [facility] Department.   PAST MEDICAL HISTORY:  Significant for as above.  Denies any cardiovascular  disease or diabetes.   PAST SURGICAL HISTORY:  Significant for hernia repair.   MEDICATIONS PRIOR TO ADMISSION:  1. Bextra 20 mg p.o. q.d.  2. Ibuprofen 800 mg p.o. q.d.  3. Blood pressure medication unknown dosage or name.   PRIMARY CARE Briar Sword:  [facility].   REVIEW OF SYSTEMS:  Significant for shortness of breath and ankle sprain.   SOCIAL HISTORY:  The patient lives alone in [place].   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  She lives in one-level home.  No children.  Gets support from family and friends.  [name] may be able to assist patient  after discharge.  He is still driving and smokes about half pack daily.  He  has four sisters and five brothers.  He has a 10th grade education.   FAMILY HISTORY:  Father deceased unknown cause. Mother deceased unknown  cause.   ALLERGIES:  None.   HOSPITAL COURSE:  The patient was admitted to [facility] Rehab Department on  03/26/02 for patient rehabilitation where he received more than three hours  of therapy daily.  His hospital course is significant for the following:  1. Left MCA CVA.  Overall, the patient made good progress with mobility and     performing ADLs.  Does still have moderate amount of cognition deficit     and decreased awareness.  He was discharged home at an independent level     with ambulation, require supervision due to cognitive deficits.  The     patient will remain on aspirin 325 mg p.o. q.d. for CVA prophylaxis.  The     patient had no neurological symptoms that occurred while in rehab.  He     was able to tolerate therapies very well.  2. History of hypertension.  At time of admission, the patient was placed on     Catapres 0.2 mg p.o. b.i.d. as well as HCTZ 25 mg p.o. q.d.   Dictation ended at this point.     Junie Bame, P.A.                       Ellwood Dense, M.D.    LH/MEDQ  D:  04/04/2002  T:  04/04/2002  Job:  [id number]   cc:   [facility]

## 2010-10-02 NOTE — H&P (Signed)
[name], [name] NO.:  [id number]   MEDICAL RECORD NO.:  [id number]                   PATIENT TYPE:  INP   LOCATION:  1825                                 FACILITY:  [facility]   PHYSICIAN:  Arvilla Meres, M.D. [facility]          DATE OF BIRTH:  12-Mar-1941   DATE OF ADMISSION:  12/10/2002  DATE OF DISCHARGE:                                HISTORY & PHYSICAL   PRIMARY CARE:  [facility].   CARDIOLOGIST:  Cecil Cranker, M.D. of cardiology.   HISTORY OF PRESENT ILLNESS:  Ian [name] is a very pleasant 70 year old  black male with hypertension and CVA in November 2003 and alcohol abuse who  presents with acute coronary syndrome.   He denies any previous history of cardiac disease, he had a CVA in November  2003 with mild residual right-sided weakness.   He awoke from sleep at approximately 3 a.m. with acute onset of midsternal  chest pain which he rated as 10/10.  There was no radiation, shortness of  breath, diaphoresis, or nausea and vomiting.  He then drove himself to the  ER with progression of his pain.  In the ER, he was initial EKG showed  marked ST depression inferiorly and in the precordial leads from V2-V4.  He  was given aspirin, nitroglycerin and heparin with resolution of his chest  pain and ST depression.  He is now pain-free.   REVIEW OF SYSTEMS:  He denies any fevers, chills, nausea, vomiting, bright  red blood per rectum, or CHF.   PAST MEDICAL HISTORY:  1. CVA with residual right-sided weakness.  2. Hypertension, poorly controlled.  3. Alcohol abuse.  4. Tobacco abuse.  5. Medical noncompliance.  6. Umbilical hernia repair.   MEDICATIONS:  Aspirin and Plavix and he says he ran out two weeks ago.   ALLERGIES:  HE HAS NO KNOWN DRUG ALLERGIES.   SOCIAL HISTORY:  He lives in [place] with a roommate, he is disabled.  He  smokes one third to one half pack per day of tobacco and he drinks at least  one pint of gin every  weekend and some during the week.   FAMILY HISTORY:  His mother died at 36, questionable MI.  Father died at 68,  questionable MI.   PHYSICAL EXAMINATION:  GENERAL:  He is comfortable, he has a left-sided  facial droop.  VITAL SIGNS:  Afebrile, blood pressure 158/86, heart rate 64.  HEENT:  Sclerae anicteric.  NECK:  There is no JVD, carotids are 2+ bilaterally, without bruits.  CARDIAC:  Regular rate and rhythm with a normal S1 and S2, plus S4.  No  murmur.  LUNGS:  Clear to auscultation.  ABDOMEN:  There is a well-healed midline scar, he is otherwise benign.  EXTREMITIES:  He has no clubbing, cyanosis or edema.  His extremities are  warm.  His distal pulses are 1+ on the left and nonpalpable on the  right.  His femoral pulses are 2+ bilaterally without bruits.  RECTAL:  He has brown stool which is heme-negative.   LABORATORY DATA:  His white count is 10.6, his hematocrit is 43 and his  platelets are 324,000.  His sodium is 140, potassium 3.2, BUN 14, creatinine  1.1, and glucose is 118.  His CK-MB is 1.7, his troponin is less than 0.05.  First EKG shows normal sinus rhythm at 83 with left anterior fascicular  block, marked ST depression in 2, 3, F, and V2-V5.  EKG #2 is normal sinus  rhythm with left anterior fascicular block with near total resolution of his  ST depression.  However, there is some T wave inversion laterally.  Portable  chest x-ray:  He has flat diaphragms consistent with COPD, there is no acute  process.   ASSESSMENT:  This is a 70 year old black male without previous history of  known coronary artery disease who presents with acute coronary syndrome.  He  is now chest pain-free.   PLAN:  1. We will treat with aspirin, heparin, ACE inhibitor, statin, beta-blocker,     watching carefully for bradycardia and nitroglycerin.  2. We will attempt to enroll him in the Acuity Trial.  He will need a     cardiac catheterization.  He will also need DT  prophylaxis.                                                Arvilla Meres, M.D. [facility]    DB/MEDQ  D:  12/10/2002  T:  12/10/2002  Job:  [id number]   cc:   Cecil Cranker, M.D.   [facility]

## 2010-10-02 NOTE — Discharge Summary (Signed)
Ian [name], Ian [name] NO.:  [id number]   MEDICAL RECORD NO.:  [id number]                   PATIENT TYPE:  INP   LOCATION:  3004                                 FACILITY:  [facility]   PHYSICIAN:  Melvyn Novas, M.D.               DATE OF BIRTH:  17-Sep-1940   DATE OF ADMISSION:  03/21/2002  DATE OF DISCHARGE:  03/26/2002                                 DISCHARGE SUMMARY   HISTORY OF PRESENT ILLNESS:  The patient presented originally on March 21, 2002, to the hospital ER with aphagia and right hemiparesis.  The patient  had been followed at [facility] in [place], [place], where he  received treatment for hypotension.  The patient has no diabetes mellitus,  known history of stroke and is a cigarette and alcohol user.   PAST MEDICAL HISTORY:  Besides hypertension, this was positive for a fall  injury with fractures to four toes of the right foot July this year.  The  patient is obese and he had what he believed was an appendectomy but he does  have left-sided abdominal scar.   MEDICATIONS:  1. Bextra 20 mg.  2. Ibuprofen 800 mg.  3. Two different antihypertensives that he could not name.  He knew that one     was a fluid pill.   HOSPITAL COURSE:  Evaluation of his right hemiparesis and expressive aphagia  was initiated in the ER with CT scan and an MRI scan.  The magnetic  resonance showed significant intracranial arterial sclerotic type changes.  There was an acute infarct seen that seems to have occurred on top of a  chronic infarct along with the left middle cerebral artery distribution  (M2).  There is some general brain atrophy that is out of proportion for the  patient's age.  A chest x-ray showed mild CHF, pulmonary vascular congestion  is seen.   Laboratory test showed normal UA.  A blood screen showed  an alcohol level  of 81 mg/dL but no other substances of potential abuse.  The cholesterol  levels were in normal range.  A  chem-7 was normal.  A CBC with differential  showed mildly elevated RBC of 5.87 and mean corpuscular volume of only 73.0  but no anemia.  PT, PTT and INR were normal as was a comprehensive metabolic  panel.   The patient received PT and OT in the hospital and continued to improve  greatly.  A cerebrovascular evaluation on March 22, 2002, by Doppler  sonography showed no evidence of ICA stenosis.  Vertebral artery flow was  not insonated.  Body habitus and respiratory interference make the  interpretation of the study difficult.  An echocardiogram  showed no  abnormalities and a normal ejection fraction of 55 to 65 without any  evidence of akinetic wall movement.  Again, due to the patient's obesity, a  concrete evaluation might be indicated should  there be a suspicion of  embolic stroke.  This was interpreted by Georga Hacking, M.D.  An EKG  showed normal sinus rhythm, incomplete right bundle branch block and  borderline left ventricular hypertrophy.  The patient was discharged in  improved condition for further rehabilitation after ischemic stroke in  distribution of the left MCA.  He is discharged from rehab to follow up at  [facility] for at least one appointment within four  weeks after discharge.                                               Melvyn Novas, M.D.   CD/MEDQ  D:  03/26/2002  T:  03/26/2002  Job:  [id number]

## 2010-10-02 NOTE — Op Note (Signed)
NAME:  Ian [name], SCHARA                         ACCOUNT NO.:  [id number]   MEDICAL RECORD NO.:  [id number]                   PATIENT TYPE:  INP   LOCATION:  2313                                 FACILITY:  [facility]   PHYSICIAN:  Evelene Croon, M.D.                  DATE OF BIRTH:  10/02/1940   DATE OF PROCEDURE:  12/17/2002  DATE OF DISCHARGE:                                 OPERATIVE REPORT   PREOPERATIVE DIAGNOSIS:  Severe two-vessel coronary artery disease.   POSTOPERATIVE DIAGNOSIS:  Severe coronary artery disease.   OPERATIVE PROCEDURE:  1. Median sternotomy.  2. Extracorporeal circulation.  3. Coronary artery bypass graft surgery x3 using a left internal mammary     artery graft to the second branch of the first obtuse marginal coronary     artery, a saphenous vein graft to the first branch of the obtuse marginal     coronary artery, and a saphenous vein graft to the right coronary artery.  4. Endoscopic vein harvested from the right leg.   ATTENDING SURGEON:  Evelene Croon, M.D.   ASSISTANT:  Kerin Perna, M.D.   SECOND ASSISTANT:  Rowe Clack, P.A.-C.   ANESTHESIA:  General endotracheal.   CLINICAL HISTORY:  This patient is a 70 year old gentleman with multiple  cardiac risk factors including smoking, hypertension, a history of medical  noncompliance, and previous stroke, who was admitted on December 10, 2002, with  unstable angina symptoms.  Cardiac catheterization showed a 99% stenosis on  the ostium of the large bifurcating first marginal artery with two sub-  branches.  The LAD had insignificant 25% midvessel stenosis.  The right  coronary artery was a dominant vessel with an 80% midvessel stenosis.  He  had an attempt at percutaneous intervention on the first obtuse marginal  lesion, but this could not be crossed with a wire and the procedure was  aborted.  Coronary artery bypass graft surgery was then thought to be the  best treatment for this patient.  I discussed  the operative procedure with  them, including the alternatives, benefits, and risks, including bleeding,  blood transfusion, infection, stroke, myocardial infarction, and death.  He  understood and agreed to proceed.   OPERATIVE PROCEDURE:  The patient was taken to the operating room and placed  on the table in supine position.  After induction of general endotracheal  anesthesia, a Foley catheter was placed in the bladder using sterile  technique.  Then the chest, abdomen, and both lower extremities were prepped  and draped in the usual sterile manner.  The chest was entered through a  median sternotomy incision and the pericardium opened in the midline.  Examination of the heart showed good ventricular contractility.  The  ascending aorta had moderate plaque present in it.   Then the left internal mammary artery was harvested from the chest wall as a  pedicle graft.  This was a medium-caliber vessel with excellent blood flow  through it.  At the same time a segment of greater saphenous vein was  harvested from the right leg using endoscopic vein harvest technique.  This  vein was of medium size and good quality.   Then the patient was heparinized and when an adequate activated clotting  time was achieved, the distal ascending aorta was cannulated using a 20  French aortic cannula for arterial inflow.  Venous outflow was achieved  using a two-stage venous cannula through the right atrial appendage.  An  antegrade cardioplegia and vent cannula was inserted in the aortic root.   The patient was placed on cardiopulmonary bypass and the distal coronary  arteries identified.  The LAD had insignificant disease in it.  The first  marginal was a large bifurcating vessel.  It gave off a first branch that  was diffusely diseased proximally and then bifurcated into two sub-branches.  There was a second large branch of the obtuse marginal that had no disease  in it and was intramyocardial.  The  distal left circumflex terminated as a  small posterolateral branch.  The right coronary artery was heavily diseased  in its midportion but distally had no disease in it.   Then the aorta was crossclamped and 500 mL of cold blood antegrade  cardioplegia was administered in the aortic root with a quick arrest of the  heart.  Systemic hypothermia to 20 degrees Centigrade and topical  hypothermia with iced saline was used.  A temperature probe was placed in  the septum and an insulating pad in the pericardium.   The first distal anastomosis was performed to the distal right coronary  artery.  The internal diameter was greater than 2.5 mm.  The conduit used  was a segment of greater saphenous vein and the anastomosis performed in an  end-to-side manner using continuous 7-0 Prolene suture.  Flow was measured  through the graft and was excellent.   The second distal anastomosis was performed to the first branch of the  obtuse marginal coronary artery.  The internal diameter was about 1.75 mm.  The conduit used was a segment of greater saphenous vein and the anastomosis  performed in an end-to-side manner using continuous 7-0 Prolene suture.  Flow was measured through the graft and was excellent.   The third distal anastomosis was performed to the second branch of the  obtuse marginal coronary artery.  The internal diameter was about 2.5 mm.  The conduit used was a left internal mammary graft, and this was brought  through an opening in the left pericardium anterior to the phrenic nerve.  It was anastomosed to the obtuse marginal branch in an end-to-side manner  using continuous 8-0 Prolene suture.  The pedicle was tacked to the  epicardium with 6-0 Prolene sutures.  The clamp was then removed from this  mammary pedicle temporarily, and there was complete hemostasis at the  anastomosis.  Then with the crossclamp in place, the two proximal vein graft anastomoses  were performed to the aortic  root in an end-to-side manner using continuous  6-0 Prolene suture.  The patient was rewarmed to 37 degrees Centigrade.  The  clamp was then removed from the mammary pedicle.  The crossclamp was removed  with a time of 60 minutes.  There was spontaneous return of sinus rhythm.  The proximal and distal anastomoses appeared hemostatic and the alignment of  the grafts satisfactory.  Graft markers were placed  around the proximal  anastomoses.  Two temporary right ventricular and right atrial pacing wires  were placed and brought out through the skin.   When the patient had rewarmed to 37 degrees Centigrade, he was weaned from  cardiopulmonary bypass on no inotropic agents.  Total bypass time was 85  minutes.  Cardiac function appeared excellent with a cardiac output of 6.5  L/min.  Protamine was given, and the venous and aortic cannulas were removed  without difficulty.  Hemostasis was achieved.  Three chest tubes were placed  with a tube in the posterior pericardium, one in the left pleural space, and  one in the anterior mediastinum.  The pericardium was loosely reapproximated  over the heart.  The sternum was closed with #6 stainless steel wires.  The  fascia was closed with continuous #1 Vicryl suture.  The subcutaneous tissue  was closed using continuous 2-0 Vicryl and the skin with 3-0 Vicryl  subcuticular closure.  The lower extremity vein harvest site was closed in  layers in a similar manner.  The sponge, needle, and instrument counts  were correct according to the scrub nurse.  Dry sterile dressings were  applied over the incisions and around the chest tubes, which were hooked to  Pleuravac suction.  The patient remained hemodynamically stable and was  transported to the SICU in guarded but stable condition.                                               Evelene Croon, M.D.    BB/MEDQ  D:  12/17/2002  T:  12/17/2002  Job:  [id number]   cc:   [facility]   [facility]

## 2010-10-02 NOTE — H&P (Signed)
NAME:  Ian [name], Ian [name]                         ACCOUNT NO.:  [id number]   MEDICAL RECORD NO.:  [id number]                   PATIENT TYPE:  EMS   LOCATION:  MAJO                                 FACILITY:  [facility]   PHYSICIAN:  Genene Churn. Love, M.D.                 DATE OF BIRTH:  May 06, 1941   DATE OF ADMISSION:  03/21/2002  DATE OF DISCHARGE:                                HISTORY & PHYSICAL   REASON FOR ADMISSION:  This 70 year old right-handed, black, divorced male  from [place], [place], was admitted from the emergency room for  evaluation of aphasia and right hemiparesis.   HISTORY OF PRESENT ILLNESS:  The patient has been followed at [facility]  in [place], [place], and currently that service is closed.  He has  a known history of hypertension and cigarette use without diabetes mellitus  or known stroke.  Today he was on a friend's porch talking about 2:30 p.m.  when he became unresponsive, slumping to his right, had foaming at his mouth  without tonic-clonic activity, urinary or bowel incontinence.  There was no  witnessed seizure activity.  EMT service arrived approximately 1536.  At  this time, he was noted to have flaccid right paralysis and was nonverbal.  IV saline was started, and he was taken to the emergency room.   He was noted by Dr. Lynelle Doctor to again have a flaccid right hemiparesis with  inability to speak.  CT scan was obtained and on his way back, it was noted  that he could move his right arm and leg.  The patient has no recent history  of head or neck trauma or known drug use.  He does drink alcohol, amount  unknown, according to Ms. Beryle Lathe at [phone], a friend of his.  He  apparently drinks on an every-other-day basis.   PAST MEDICAL HISTORY:  1. Hypertension.  2. He has had a fall with fractures to four toes of his right foot in July     2003.  3. He has had obesity.  4. He has had some form of abdominal procedure.   HABITS:  He  drinks alcohol, amount unknown, every other day, smokes  cigarettes but amount is unknown.   MEDICATIONS:  1. Hypertensive medications.  2. Bextra 20 mg.  3. Ibuprofen 800 mg.   ALLERGIES:  No known allergies.   FAMILY HISTORY:  His mother and father are deceased.  He has three sisters  and three brothers whose health is unknown.  Educated through the 10th  grade.  He is no longer employed as of at least three months ago.  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   PHYSICAL EXAMINATION:  GENERAL:  Well-developed black male, obese, with  abdominal surgical scar.  VITAL SIGNS:  Blood pressure right and left arm 140/80, heart rate 73  and  regular. There were no bruits.  There were no seizures.  There were no signs  of trauma.  There was alcohol on his breath.  NEUROLOGIC:  Mental status: He was alert, looked at the examiner, gave his  name, was frustrated when speaking.  Gave the year in the 1900s and repeated  that for the month so that he was perseverating.  He could name some objects  but used neologisms.  He would follow two and some three-step commands.  His  cranial nerve examination revealed no visual field cut, mild right seventh.  Tongue midline, uvula midline. Gags present.  He could feel pinprick on the  right.  He moves his right arm and leg in the 4+/5 range when I saw him with  good grip and good movement of his right leg.  You could feel pinprick on  the right.  Deep tendon reflexes were 1+.  Right plantar responses upgoing,  left plantar responses downgoing.  HEENT:  Tympanic membranes were clear.  LUNGS:  Clear.  HEART:  No murmurs.  ABDOMEN:  Bowel sounds were normal.  He had an abdominal surgical scar.  GU:  He was circumcised.   LABORATORY DATA:  CT scan showed evidence of an old left brain  frontotemporal ischemic stroke involving the white matter with sylvan  fissure enlarged on the left as compared to the right.  There was no   evidence of hemorrhage.   IMPRESSION:  1. Aphasia (Code 784.3).  2. 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  3. Hypertension (Code 796.2).  4. Obesity (Code 278.01).   PLAN:  Admit the patient.  I am not giving TPN in view of the patient's  considerable improvement under observation.  The patient is to be kept  n.p.o., given aspirin, and obtain blood studies which unfortunately have not  returned at this time.                                               Genene Churn. Sandria Manly, M.D.    JML/MEDQ  D:  03/21/2002  T:  03/21/2002  Job:  [id number]

## 2010-10-02 NOTE — Cardiovascular Report (Signed)
   NAMEBRYCEN, BEAN NO.:  [id number]   MEDICAL RECORD NO.:  [id number]                   PATIENT TYPE:  INP   LOCATION:  2903                                 FACILITY:  [facility]   PHYSICIAN:  Rollene Rotunda, M.D.                DATE OF BIRTH:  Feb 27, 1941   DATE OF PROCEDURE:  12/11/2002  DATE OF DISCHARGE:                              CARDIAC CATHETERIZATION   PROCEDURE:  Left heart catheterization, coronary arteriography.   INDICATIONS:  Evaluate patient with unstable angina.   PROCEDURE NOTE:  Left heart catheterization performed via the right femoral  artery.  The artery is cannulated using anterior wall puncture.  #6-French  arterial sheath was inserted via the modified Seldinger technique.  Preformed Judkins and a pigtail catheter were utilized.  Of note, I needed a  right heart catheter and a Wholey wire to advance the catheter up to the  right femoral.   RESULTS:   HEMODYNAMICS:  LV 180/17, AO 182/98.   CORONARIES:  Left main was normal.   The LAD had a long mid 25% stenosis.   The circumflex had a proximal 99% stenosis involving the ostium of a large  bifurcating OM 1.  This compromised both the superior and inferior branch of  the OM 1 as well as the AV groove circumflex.   The right coronary artery was a dominant vessel.  There was a proximal 30%  stenosis and mid long 80% stenosis.   LEFT VENTRICULOGRAM:  A left ventriculogram was obtained in the RAO  projection.  The EF was 65% with normal wall motion.   DISTAL AORTOGRAM:  A distal aortogram was obtained.  It demonstrated a high  grade (greater than 95%) external iliac stenosis with a 60 mm gradient  across the blockage.   CONCLUSION:  1. Critical circumflex stenosis.  2. High grade right coronary artery stenosis.  3. Severe peripheral vascular disease.  4. Normal left ventricular function.   PLAN:  The patient will have percutaneous revascularization of the  circumflex  lesion per Salvadore Farber, M.D.                                               Rollene Rotunda, M.D.   JH/MEDQ  D:  12/11/2002  T:  12/12/2002  Job:  [id number]

## 2010-10-02 NOTE — Cardiovascular Report (Signed)
NAMEBARTON, WANT NO.:  [id number]   MEDICAL RECORD NO.:  [id number]                   PATIENT TYPE:  INP   LOCATION:  2903                                 FACILITY:  [facility]   PHYSICIAN:  Salvadore Farber, M.D.             DATE OF BIRTH:  [birth year]   DATE OF PROCEDURE:  12/11/2002  DATE OF DISCHARGE:                              CARDIAC CATHETERIZATION   PROCEDURE:  Aborted PCI of OM-1.   INDICATION:  Mr. Marcotte is a 70 year old gentleman without prior history of  cardiovascular disease who presents with non-ST segment elevation myocardial  infarction.  Diagnostic angiography performed by Dr. Antoine Poche demonstrated a  99% stenosis at the trifurcation of the AV groove circumflex and large  branching obtuse marginal.  There was also a 70% stenosis of the mid RCA.  Plan was for attempted percutaneous revascularization of the circumflex.   PROCEDURAL TECHNIQUE:  Informed consent was obtained.  Discussion was had  with the patient regarding the relative risks and benefits of coronary  artery bypass graft versus PCI.  He agreed to proceed with attempted PCI if  such could be done with relatively low risk.  The patient arrived in the lab  on heparin and Integrelin.  Additional heparin was given to achieve an ACT  of greater than 200 seconds.  Plavix 300 mg was administered orally.  The  pre-existing 6 French femoral arterial sheath was upsized to 7 [place] over  wire.  A 6 French sheath was placed in the right femoral vein using the  modified Seldinger technique.  A 7 [place] Voda left 4 guide was advanced  over wire and engaged in the ostium of the left main coronary.  A Luge wire  was passed without difficulty beyond the lesion into the distal portion of  the lateral branch of OM-1.  Multiple attempts were then made to wire the  more medial division.  However, I was unable to pass a wire into this  division.  Wires attempted included Luge, Hi-Torque  Floppy, Whisper and  [employer]ahi soft.  None could be made to engage the lesion.  With no ability to  protect this major branch, the procedure became high risk.  I felt the risks  then outweighed the risk of coronary artery bypass graft.  Therefore, I  aborted the procedure.  No balloons were utilized.  Final angiography  demonstrated no change in the stenosis.  There remains 99% stenosis with  TIMI-3 flow to the distal vessel.   IMPRESSION/RECOMMENDATION:  Aborted percutaneous coronary intervention of OM-  1.  Will discontinue his Plavix and ask CVTS to consider coronary artery  bypass graft.  Salvadore Farber, M.D.    WED/MEDQ  D:  12/11/2002  T:  12/12/2002  Job:  [id number]   cc:   [place], M.D.

## 2011-09-20 ENCOUNTER — Other Ambulatory Visit: Payer: Self-pay | Admitting: Internal Medicine

## 2011-09-20 ENCOUNTER — Other Ambulatory Visit: Payer: Self-pay | Admitting: Cardiovascular Disease

## 2014-06-07 ENCOUNTER — Emergency Department (HOSPITAL_COMMUNITY): Payer: Medicare Other

## 2014-06-07 ENCOUNTER — Inpatient Hospital Stay (HOSPITAL_COMMUNITY)
Admission: EM | Admit: 2014-06-07 | Discharge: 2014-06-11 | DRG: 064 | Disposition: A | Discharge status: Skilled Nursing Facility | Payer: Medicare Other | Attending: Internal Medicine | Admitting: Internal Medicine

## 2014-06-07 ENCOUNTER — Inpatient Hospital Stay (HOSPITAL_COMMUNITY): Payer: Medicare Other

## 2014-06-07 ENCOUNTER — Encounter (HOSPITAL_COMMUNITY): Payer: Self-pay | Admitting: General Practice

## 2014-06-07 DIAGNOSIS — F1010 Alcohol abuse, uncomplicated: Secondary | ICD-10-CM

## 2014-06-07 DIAGNOSIS — Z8673 Personal history of transient ischemic attack (TIA), and cerebral infarction without residual deficits: Secondary | ICD-10-CM

## 2014-06-07 DIAGNOSIS — Z8679 Personal history of other diseases of the circulatory system: Secondary | ICD-10-CM

## 2014-06-07 DIAGNOSIS — Z951 Presence of aortocoronary bypass graft: Secondary | ICD-10-CM | POA: Diagnosis not present

## 2014-06-07 DIAGNOSIS — M6281 Muscle weakness (generalized): Secondary | ICD-10-CM | POA: Diagnosis not present

## 2014-06-07 DIAGNOSIS — I2510 Atherosclerotic heart disease of native coronary artery without angina pectoris: Secondary | ICD-10-CM | POA: Diagnosis present

## 2014-06-07 DIAGNOSIS — Z79899 Other long term (current) drug therapy: Secondary | ICD-10-CM

## 2014-06-07 DIAGNOSIS — I10 Essential (primary) hypertension: Secondary | ICD-10-CM | POA: Diagnosis not present

## 2014-06-07 DIAGNOSIS — E785 Hyperlipidemia, unspecified: Secondary | ICD-10-CM

## 2014-06-07 DIAGNOSIS — I6931 Cognitive deficits following cerebral infarction: Secondary | ICD-10-CM | POA: Diagnosis not present

## 2014-06-07 DIAGNOSIS — F0391 Unspecified dementia with behavioral disturbance: Secondary | ICD-10-CM | POA: Diagnosis not present

## 2014-06-07 DIAGNOSIS — G9340 Encephalopathy, unspecified: Secondary | ICD-10-CM | POA: Diagnosis not present

## 2014-06-07 DIAGNOSIS — F1021 Alcohol dependence, in remission: Secondary | ICD-10-CM | POA: Diagnosis present

## 2014-06-07 DIAGNOSIS — R410 Disorientation, unspecified: Secondary | ICD-10-CM | POA: Diagnosis not present

## 2014-06-07 DIAGNOSIS — I6349 Cerebral infarction due to embolism of other cerebral artery: Secondary | ICD-10-CM | POA: Diagnosis present

## 2014-06-07 DIAGNOSIS — I639 Cerebral infarction, unspecified: Secondary | ICD-10-CM | POA: Diagnosis not present

## 2014-06-07 DIAGNOSIS — I69328 Other speech and language deficits following cerebral infarction: Secondary | ICD-10-CM | POA: Diagnosis not present

## 2014-06-07 DIAGNOSIS — I119 Hypertensive heart disease without heart failure: Secondary | ICD-10-CM

## 2014-06-07 DIAGNOSIS — I6350 Cerebral infarction due to unspecified occlusion or stenosis of unspecified cerebral artery: Secondary | ICD-10-CM | POA: Insufficient documentation

## 2014-06-07 DIAGNOSIS — F17210 Nicotine dependence, cigarettes, uncomplicated: Secondary | ICD-10-CM | POA: Diagnosis present

## 2014-06-07 DIAGNOSIS — R278 Other lack of coordination: Secondary | ICD-10-CM | POA: Diagnosis not present

## 2014-06-07 DIAGNOSIS — G9349 Other encephalopathy: Secondary | ICD-10-CM | POA: Diagnosis not present

## 2014-06-07 DIAGNOSIS — I672 Cerebral atherosclerosis: Secondary | ICD-10-CM | POA: Diagnosis present

## 2014-06-07 DIAGNOSIS — Z720 Tobacco use: Secondary | ICD-10-CM

## 2014-06-07 DIAGNOSIS — R4182 Altered mental status, unspecified: Secondary | ICD-10-CM | POA: Diagnosis not present

## 2014-06-07 DIAGNOSIS — I6340 Cerebral infarction due to embolism of unspecified cerebral artery: Secondary | ICD-10-CM | POA: Diagnosis not present

## 2014-06-07 DIAGNOSIS — R030 Elevated blood-pressure reading, without diagnosis of hypertension: Secondary | ICD-10-CM | POA: Diagnosis not present

## 2014-06-07 DIAGNOSIS — I6523 Occlusion and stenosis of bilateral carotid arteries: Secondary | ICD-10-CM | POA: Diagnosis not present

## 2014-06-07 DIAGNOSIS — F0390 Unspecified dementia without behavioral disturbance: Secondary | ICD-10-CM | POA: Diagnosis present

## 2014-06-07 DIAGNOSIS — R488 Other symbolic dysfunctions: Secondary | ICD-10-CM | POA: Diagnosis not present

## 2014-06-07 DIAGNOSIS — I6789 Other cerebrovascular disease: Secondary | ICD-10-CM | POA: Diagnosis not present

## 2014-06-07 LAB — COMPREHENSIVE METABOLIC PANEL
ALBUMIN: 3.9 g/dL (ref 3.5–5.2)
ALT: 11 U/L (ref 0–53)
AST: 20 U/L (ref 0–37)
Alkaline Phosphatase: 88 U/L (ref 39–117)
Anion gap: 10 (ref 5–15)
BUN: 11 mg/dL (ref 6–23)
CO2: 23 mmol/L (ref 19–32)
Calcium: 9.7 mg/dL (ref 8.4–10.5)
Chloride: 107 mmol/L (ref 96–112)
Creatinine, Ser: 1.3 mg/dL (ref 0.50–1.35)
GFR calc non Af Amer: 53 mL/min — ABNORMAL LOW (ref >90)
GFR, EST AFRICAN AMERICAN: 62 mL/min — AB (ref >90)
Glucose, Bld: 126 mg/dL — ABNORMAL HIGH (ref 70–99)
Potassium: 3.8 mmol/L (ref 3.5–5.1)
Sodium: 140 mmol/L (ref 135–145)
TOTAL PROTEIN: 7.5 g/dL (ref 6.0–8.3)
Total Bilirubin: 0.9 mg/dL (ref 0.3–1.2)

## 2014-06-07 LAB — PROTIME-INR
INR: 1.05 (ref 0.00–1.49)
PROTHROMBIN TIME: 13.8 s (ref 11.6–15.2)

## 2014-06-07 LAB — URINE MICROSCOPIC-ADD ON

## 2014-06-07 LAB — RAPID URINE DRUG SCREEN, HOSP PERFORMED
AMPHETAMINES: NOT DETECTED
BARBITURATES: NOT DETECTED
Benzodiazepines: NOT DETECTED
Cocaine: NOT DETECTED
Opiates: NOT DETECTED
Tetrahydrocannabinol: NOT DETECTED

## 2014-06-07 LAB — URINALYSIS, ROUTINE W REFLEX MICROSCOPIC
Glucose, UA: NEGATIVE mg/dL
Hgb urine dipstick: NEGATIVE
KETONES UR: 15 mg/dL — AB
Nitrite: NEGATIVE
PH: 5.5 (ref 5.0–8.0)
Protein, ur: NEGATIVE mg/dL
Specific Gravity, Urine: 1.026 (ref 1.005–1.030)
UROBILINOGEN UA: 1 mg/dL (ref 0.0–1.0)

## 2014-06-07 LAB — CBC WITH DIFFERENTIAL/PLATELET
BASOS PCT: 1 % (ref 0–1)
Basophils Absolute: 0 10*3/uL (ref 0.0–0.1)
Eosinophils Absolute: 0 10*3/uL (ref 0.0–0.7)
Eosinophils Relative: 1 % (ref 0–5)
HCT: 47.6 % (ref 39.0–52.0)
Hemoglobin: 15.5 g/dL (ref 13.0–17.0)
LYMPHS ABS: 1.1 10*3/uL (ref 0.7–4.0)
Lymphocytes Relative: 24 % (ref 12–46)
MCH: 23.1 pg — ABNORMAL LOW (ref 26.0–34.0)
MCHC: 32.6 g/dL (ref 30.0–36.0)
MCV: 71 fL — AB (ref 78.0–100.0)
Monocytes Absolute: 0.8 10*3/uL (ref 0.1–1.0)
Monocytes Relative: 18 % — ABNORMAL HIGH (ref 3–12)
NEUTROS ABS: 2.6 10*3/uL (ref 1.7–7.7)
Neutrophils Relative %: 56 % (ref 43–77)
PLATELETS: 290 10*3/uL (ref 150–400)
RBC: 6.7 MIL/uL — ABNORMAL HIGH (ref 4.22–5.81)
RDW: 14.9 % (ref 11.5–15.5)
WBC: 4.5 10*3/uL (ref 4.0–10.5)

## 2014-06-07 LAB — CREATININE, SERUM
Creatinine, Ser: 1.22 mg/dL (ref 0.50–1.35)
GFR calc non Af Amer: 57 mL/min — ABNORMAL LOW (ref >90)
GFR, EST AFRICAN AMERICAN: 67 mL/min — AB (ref >90)

## 2014-06-07 LAB — CBC
HCT: 45.1 % (ref 39.0–52.0)
Hemoglobin: 14.9 g/dL (ref 13.0–17.0)
MCH: 23.2 pg — ABNORMAL LOW (ref 26.0–34.0)
MCHC: 33 g/dL (ref 30.0–36.0)
MCV: 70.1 fL — ABNORMAL LOW (ref 78.0–100.0)
Platelets: 284 10*3/uL (ref 150–400)
RBC: 6.43 MIL/uL — AB (ref 4.22–5.81)
RDW: 15 % (ref 11.5–15.5)
WBC: 6.1 10*3/uL (ref 4.0–10.5)

## 2014-06-07 LAB — I-STAT TROPONIN, ED: Troponin i, poc: 0.03 ng/mL (ref 0.00–0.08)

## 2014-06-07 LAB — TSH: TSH: 0.912 u[IU]/mL (ref 0.350–4.500)

## 2014-06-07 LAB — I-STAT CG4 LACTIC ACID, ED: Lactic Acid, Venous: 1.35 mmol/L (ref 0.5–2.0)

## 2014-06-07 MED ORDER — CARVEDILOL 6.25 MG PO TABS
6.2500 mg | ORAL_TABLET | Freq: Two times a day (BID) | ORAL | Status: DC
  Administered 2014-06-07 – 2014-06-11 (×9): 6.25 mg via ORAL
  Filled 2014-06-07 (×10): qty 1

## 2014-06-07 MED ORDER — HYDRALAZINE HCL 20 MG/ML IJ SOLN
10.0000 mg | INTRAMUSCULAR | Status: DC | PRN
  Administered 2014-06-07: 10 mg via INTRAVENOUS
  Filled 2014-06-07: qty 1

## 2014-06-07 MED ORDER — DEXTROSE 5 % IV SOLN: 500.0000 mg | INTRAVENOUS | Status: DC

## 2014-06-07 MED ORDER — MORPHINE SULFATE 2 MG/ML IJ SOLN: 1.0000 mg | INTRAMUSCULAR | Status: DC | PRN

## 2014-06-07 MED ORDER — ONDANSETRON HCL 4 MG/2ML IJ SOLN
4.0000 mg | Freq: Four times a day (QID) | INTRAMUSCULAR | Status: DC | PRN
Start: 2014-06-07 — End: 2014-06-11

## 2014-06-07 MED ORDER — LISINOPRIL 20 MG PO TABS: 20.0000 mg | ORAL_TABLET | Freq: Every day | ORAL | Status: DC

## 2014-06-07 MED ORDER — ACETAMINOPHEN 650 MG RE SUPP
650.0000 mg | Freq: Four times a day (QID) | RECTAL | Status: DC | PRN
Start: 2014-06-07 — End: 2014-06-11

## 2014-06-07 MED ORDER — SODIUM CHLORIDE 0.9 % IV SOLN
1000.0000 mL | Freq: Once | INTRAVENOUS | Status: AC
  Administered 2014-06-07: 1000 mL via INTRAVENOUS

## 2014-06-07 MED ORDER — ALUM & MAG HYDROXIDE-SIMETH 200-200-20 MG/5ML PO SUSP: 30.0000 mL | Freq: Four times a day (QID) | ORAL | Status: DC | PRN

## 2014-06-07 MED ORDER — SODIUM CHLORIDE 0.9 % IV SOLN
INTRAVENOUS | Status: DC
  Administered 2014-06-07 – 2014-06-10 (×4): via INTRAVENOUS

## 2014-06-07 MED ORDER — STROKE: EARLY STAGES OF RECOVERY BOOK
Freq: Once | Status: DC
  Filled 2014-06-07: qty 1

## 2014-06-07 MED ORDER — CEFTRIAXONE SODIUM IN DEXTROSE 20 MG/ML IV SOLN
1.0000 g | INTRAVENOUS | Status: DC
  Administered 2014-06-07: 1 g via INTRAVENOUS
  Filled 2014-06-07 (×2): qty 50

## 2014-06-07 MED ORDER — FLUOXETINE HCL 20 MG PO TABS: 20.0000 mg | ORAL_TABLET | Freq: Every day | ORAL | Status: DC

## 2014-06-07 MED ORDER — AMLODIPINE BESYLATE 5 MG PO TABS: 5.0000 mg | ORAL_TABLET | Freq: Every day | ORAL | Status: DC

## 2014-06-07 MED ORDER — ACETAMINOPHEN 325 MG PO TABS
650.0000 mg | ORAL_TABLET | Freq: Four times a day (QID) | ORAL | Status: DC | PRN
Start: 2014-06-07 — End: 2014-06-11

## 2014-06-07 MED ORDER — DEXTROSE 5 % IV SOLN: 1.0000 g | INTRAVENOUS | Status: DC

## 2014-06-07 MED ORDER — HEPARIN SODIUM (PORCINE) 5000 UNIT/ML IJ SOLN
5000.0000 [IU] | Freq: Three times a day (TID) | INTRAMUSCULAR | Status: DC
  Administered 2014-06-07 – 2014-06-11 (×12): 5000 [IU] via SUBCUTANEOUS
  Filled 2014-06-07 (×12): qty 1

## 2014-06-07 MED ORDER — ASPIRIN EC 325 MG PO TBEC
325.0000 mg | DELAYED_RELEASE_TABLET | Freq: Every day | ORAL | Status: DC
  Administered 2014-06-08: 325 mg via ORAL
  Filled 2014-06-07: qty 1

## 2014-06-07 MED ORDER — LABETALOL HCL 5 MG/ML IV SOLN
10.0000 mg | INTRAVENOUS | Status: DC | PRN
  Administered 2014-06-07: 10 mg via INTRAVENOUS
  Filled 2014-06-07 (×2): qty 4

## 2014-06-07 MED ORDER — ONDANSETRON HCL 4 MG PO TABS: 4.0000 mg | ORAL_TABLET | Freq: Four times a day (QID) | ORAL | Status: DC | PRN

## 2014-06-07 MED ORDER — SIMVASTATIN 40 MG PO TABS
40.0000 mg | ORAL_TABLET | Freq: Every day | ORAL | Status: DC
  Administered 2014-06-07: 40 mg via ORAL
  Filled 2014-06-07 (×2): qty 1

## 2014-06-07 MED ORDER — DEXTROSE 5 % IV SOLN
500.0000 mg | INTRAVENOUS | Status: DC
  Administered 2014-06-07: 500 mg via INTRAVENOUS
  Filled 2014-06-07 (×2): qty 500

## 2014-06-07 MED ORDER — OXYCODONE HCL 5 MG PO TABS: 5.0000 mg | ORAL_TABLET | ORAL | Status: DC | PRN

## 2014-06-07 NOTE — Progress Notes (Signed)
Pt. Arrived to unit.  Pt. Is alert & oriented to person only.  Neuro check done and charted.  IV fluids started.

## 2014-06-07 NOTE — ED Provider Notes (Signed)
CSN: [id number]     Arrival date & time 06/07/14  1349 History   First MD Initiated Contact with Patient 06/07/14 1354     Chief Complaint  Patient presents with  . Altered Mental Status     (Consider location/radiation/quality/duration/timing/severity/associated sxs/prior Treatment) HPI The patient does not have a specific complaint. He reports he just doesn't feel very well. He denies that he has any pain. He does endorse cough and sputum production but can't specify for what period of time. The patient denies that he's been having any vomiting or diarrhea. The patient is alert but confused. He cannot give me his address although he reflects on for some time, he also cannot tell me what year it is. He thought it was [birth year] "something", and when I told him it was 2016 he agreed that that was correct. The patient lives by himself, there are no additional historians present. History from EMS is that the patient was being checked on by a neighbor and they felt that he was not doing well. The report from EMS per the neighbor is that the patient has been losing weight and not eating well. Per EMS the patient was only able to identify his name but not location and time. 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 The patient reports his last alcohol use was greater than a month ago. The patient does continue to smoke regularly. Past Medical History  Diagnosis Date  . CAD (coronary artery disease)     LIMA to OM2, SVG to OM1, SVG to RCA  . HLD (hyperlipidemia)   . HTN (hypertension)   . BPH (benign prostatic hyperplasia)   . Stroke    Past Surgical History  Procedure Laterality Date  . Coronary artery bypass graft  2001  . Hernia repair     Family History  Problem Relation Age of Onset  . Arthritis    . Hypertension    . Stroke    . Heart attack Mother   . Heart attack Father    History  Substance Use Topics  . Smoking status: Current Some Day Smoker  . Smokeless tobacco: Not on file  .  Alcohol Use: Yes    Review of Systems The patient seems unreliable for review of systems but does not endorse positives on 10 systems reviewed.   Allergies  Review of patient's allergies indicates no known allergies.  Home Medications   Prior to Admission medications   Medication Sig Start Date End Date Taking? Authorizing Provider  amLODipine (NORVASC) 5 MG tablet TAKE ONE TABLET BY MOUTH EVERY DAY Patient not taking: Reported on 06/07/2014 09/20/11   Kathleene Hazel, MD  carvedilol (COREG) 6.25 MG tablet Take 1 tablet (6.25 mg total) by mouth 2 (two) times daily with a meal. Patient not taking: Reported on 06/07/2014 08/17/10   Etta Grandchild, MD  FLUoxetine (PROZAC) 20 MG tablet Take 1 tablet (20 mg total) by mouth daily. Patient not taking: Reported on 06/07/2014 08/17/10   Etta Grandchild, MD  hydrochlorothiazide 25 MG tablet Take 1 tablet (25 mg total) by mouth daily. Patient not taking: Reported on 06/07/2014 08/17/10   Etta Grandchild, MD  lisinopril (PRINIVIL,ZESTRIL) 20 MG tablet Take 1 tablet (20 mg total) by mouth daily. Patient not taking: Reported on 06/07/2014 08/17/10   Etta Grandchild, MD  Multiple Vitamins-Minerals (MENS ONE DAILY PO) Take by mouth daily.      Historical Provider, MD  nitroGLYCERIN (NITROSTAT) 0.4 MG SL tablet Place  1 tablet (0.4 mg total) under the tongue every 5 (five) minutes as needed for chest pain. 09/17/10 09/17/11  Kathleene Hazelhristopher D McAlhany, MD  simvastatin (ZOCOR) 40 MG tablet Take 1 tablet (40 mg total) by mouth at bedtime. Patient not taking: Reported on 06/07/2014 08/17/10   Etta Grandchildhomas L Jones, MD   BP 169/83 mmHg  Pulse 83  Temp(Src) 99 F (37.2 C) (Rectal)  Resp 21  Ht 5\' 9"  (1.753 m)  SpO2 98% Physical Exam  Constitutional:  The patient is a slender, alert male who is deconditioned in appearance. Grooming is poor. He does not have any acute respiratory distress, he has intermittent wet cough.  HENT:  Head: Normocephalic and atraumatic.  Right Ear:  External ear normal.  Left Ear: External ear normal.  Nose: Nose normal.  Mouth/Throat: Oropharynx is clear and moist. No oropharyngeal exudate.  Eyes: EOM are normal. Pupils are equal, round, and reactive to light. Right eye exhibits no discharge. Left eye exhibits no discharge.  Neck: Neck supple.  Cardiovascular: Normal rate, regular rhythm, normal heart sounds and intact distal pulses.   Pulmonary/Chest: Effort normal. No respiratory distress.  The patient has occasional rhonchi. This clears cough.  Abdominal: Soft. Bowel sounds are normal. He exhibits no distension and no mass. There is no tenderness. There is no guarding.  Musculoskeletal: Normal range of motion. He exhibits no edema or tenderness.  Neurological: He is alert. No cranial nerve deficit.  The patient follows commands appropriately for bilateral grip strength which is 5/5. Patient can elevate each lower extremity independently off the bed. His words are clear however the content is moderately confused.  Skin: Skin is warm and dry.  The patient's skin is warm and dry, he has significant excoriations around his ankles. He has extensive onychomycosis. There was a live bedbug found on the patient.  Psychiatric:  The patient is calm and cooperative.    ED Course  Procedures (including critical care time) Labs Review Labs Reviewed  CBC WITH DIFFERENTIAL - Abnormal; Notable for the following:    RBC 6.70 (*)    MCV 71.0 (*)    MCH 23.1 (*)    Monocytes Relative 18 (*)    All other components within normal limits  COMPREHENSIVE METABOLIC PANEL - Abnormal; Notable for the following:    Glucose, Bld 126 (*)    GFR calc non Af Amer 53 (*)    GFR calc Af Amer 62 (*)    All other components within normal limits  PROTIME-INR  URINALYSIS, ROUTINE W REFLEX MICROSCOPIC  URINE RAPID DRUG SCREEN (HOSP PERFORMED)  I-STAT CG4 LACTIC ACID, ED  Rosezena SensorI-STAT TROPOININ, ED    Imaging Review Dg Chest 2 View  06/07/2014   CLINICAL DATA:   Altered mental status, failure to thrive  EXAM: CHEST  2 VIEW  COMPARISON:  None.  FINDINGS: The cardiac shadow is within normal limits. Postsurgical changes are noted. The lungs are well aerated bilaterally without focal infiltrate or sizable effusion. A few scattered calcifications are noted likely related to prior granulomatous disease. No acute bony abnormality is seen.  IMPRESSION: No acute abnormality noted.   Electronically Signed   By: Alcide CleverMark  Lukens M.D.   On: 06/07/2014 15:11   Ct Head Wo Contrast  06/07/2014   CLINICAL DATA:  74 year old male with failure to thrive. Confusion and altered mentation  EXAM: CT HEAD WITHOUT CONTRAST  TECHNIQUE: Contiguous axial images were obtained from the base of the skull through the vertex without intravenous contrast.  COMPARISON:  None.  FINDINGS: Low attenuation within the subcortical and periventricular white matter is identified compatible with chronic small vessel ischemic disease. Focal area of low attenuation within the left centrum semiovale is identified consistent with subacute to chronic lacunar infarct, image number 19/series 201. Prominence of the sulci and ventricles are identified consistent with brain atrophy. No evidence for acute brain infarct, hemorrhage or mass. The paranasal sinuses are clear. The mastoid air cells are also clear.  IMPRESSION: 1. Small vessel ischemic disease and brain atrophy. 2. 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   Electronically Signed   By: Signa Kell M.D.   On: 06/07/2014 15:02     EKG Interpretation   Date/Time:  Friday June 07 2014 14:12:42 EST Ventricular Rate:  87 PR Interval:  142 QRS Duration: 84 QT Interval:  502 QTC Calculation: 604 R Axis:   -73 Text Interpretation:  Sinus rhythm Atrial premature complexes Inferior  infarct, old Prolonged QT interval agree. no STEMI Confirmed by Donnald Garre,  MD, Lebron Conners [phone]) on 06/07/2014 3:28:33 PM      MDM   Final diagnoses:   Confusion with non-focal neuro exam   At this time there are no additional historians present. The patient does show signs of confusion but the onset of symptoms is unclear. Consideration is given for possible CVA or insidious dementia. He is alert and shows no signs of respiratory distress. The patient does report cough and has a wet sounding cough although at this point a chest x-ray does not show focal infiltrate. Other consideration is for onset of infectious illness without diagnostic manifestation at this time.    Arby Barrette, MD 06/07/14 1655

## 2014-06-07 NOTE — H&P (Signed)
[facility] History and Physical  Ian [name] ZOX:[id number] DOB: 04-Dec-1940 DOA: 06/07/2014  Referring physician:  PCP: Sanda Linger, MD   Chief Complaint: Altered mental status  HPI: Ian [name] is a 74 y.o. male with a past medical history of CVA, alcohol abuse, hypertension, dyslipidemia, who was brought to the emergency department at [facility] by EMS for mental status changes. History was obtained from emergency room staff and medical records as patient was unable to provide reliable history in the emergency department. It appears that he went to a neighbors house reporting that he didn't feel well. His neighbor noted patient not be acting himself and that he has had a progressive decline over the past several weeks. His neighbor called EMS. He was found to be confused, soiled, and disheveled.  Per medical records patient has history of alcoholism as patient reports his last drink was greater than one month ago. Workup in the emergency room included a CT scan of brain which showed subacute to chronic left centrum semiovale lacunar infarct along with small vessel ischemic changes. He was found to be hypertensive having systolic blood pressures in the 190s.                                                                                                                                                                                                                            Review of Systems:  A reliable review of systems could not be obtained due to acute encephalopathy  Past Medical History  Diagnosis Date  . CAD (coronary artery disease)     LIMA to OM2, SVG to OM1, SVG to RCA  . HLD (hyperlipidemia)   . HTN (hypertension)   . BPH (benign prostatic hyperplasia)   . Stroke    Past Surgical History  Procedure Laterality Date  . Coronary artery bypass graft  2001  . Hernia repair     Social History:  reports that he has been smoking.  He does not have any smokeless  tobacco history on file. He reports that he drinks alcohol. He reports that he does not use illicit drugs.  No Known Allergies  Family History  Problem Relation Age of Onset  . Arthritis    . Hypertension    . Stroke    . Heart attack Mother   . Heart attack Father      Prior to Admission medications   Medication Sig Start Date End Date Taking? Authorizing Provider  amLODipine (NORVASC) 5 MG tablet TAKE ONE TABLET BY MOUTH EVERY DAY Patient not taking: Reported on 06/07/2014 09/20/11   Kathleene Hazelhristopher D McAlhany, MD  carvedilol (COREG) 6.25 MG tablet Take 1 tablet (6.25 mg total) by mouth 2 (two) times daily with a meal. Patient not taking: Reported on 06/07/2014 08/17/10   Etta Grandchildhomas L Jones, MD  FLUoxetine (PROZAC) 20 MG tablet Take 1 tablet (20 mg total) by mouth daily. Patient not taking: Reported on 06/07/2014 08/17/10   Etta Grandchildhomas L Jones, MD  hydrochlorothiazide 25 MG tablet Take 1 tablet (25 mg total) by mouth daily. Patient not taking: Reported on 06/07/2014 08/17/10   Etta Grandchildhomas L Jones, MD  lisinopril (PRINIVIL,ZESTRIL) 20 MG tablet Take 1 tablet (20 mg total) by mouth daily. Patient not taking: Reported on 06/07/2014 08/17/10   Etta Grandchildhomas L Jones, MD  Multiple Vitamins-Minerals (MENS ONE DAILY PO) Take by mouth daily.      Historical Provider, MD  nitroGLYCERIN (NITROSTAT) 0.4 MG SL tablet Place 1 tablet (0.4 mg total) under the tongue every 5 (five) minutes as needed for chest pain. 09/17/10 09/17/11  Kathleene Hazelhristopher D McAlhany, MD  simvastatin (ZOCOR) 40 MG tablet Take 1 tablet (40 mg total) by mouth at bedtime. Patient not taking: Reported on 06/07/2014 08/17/10   Etta Grandchildhomas L Jones, MD   Physical Exam: Filed Vitals:   06/07/14 1410 06/07/14 1415 06/07/14 1430 06/07/14 1600  BP: 177/104 158/98 169/83 158/117  Pulse: 92 92 83 63  Temp: 99 F (37.2 C)     TempSrc: Rectal     Resp: 14 16 21 19   Height: 5\' 9"  (1.753 m)     SpO2: 96% 97% 98% 98%    Wt Readings from Last 3 Encounters:  09/17/10 88.905 kg (196 lb)   07/17/10 94.348 kg (208 lb)    General:  Patient is disheveled, confused, ill-appearing Eyes: PERRL, normal lids, irises & conjunctiva ENT: grossly normal hearing, lips & tongue Neck: no LAD, masses or thyromegaly Cardiovascular: RRR, has 2/6 systolic ejection murmur. No LE edema. Telemetry: SR, no arrhythmias  Respiratory: Lung sounds are coarse, having bilateral expiratory wheezes, positive rhonchi Abdomen: soft, ntnd Skin: no rash or induration seen on limited exam Musculoskeletal: grossly normal tone BUE/BLE Psychiatric: grossly normal mood and affect, speech fluent and appropriate Neurologic: Patient is confused and disoriented, I do not note facial droop, slurred speech, or facial asymmetry. I believe he may be having 4-5 muscle strength to his right upper extremity, with 5 out of 5 muscle strength to left upper extremity and bilateral lower extremities. There is no alteration to sensation, positive D tendon reflexes.           Labs on Admission:  Basic Metabolic Panel:  Recent Labs Lab 06/07/14 1436  NA 140  K 3.8  CL 107  CO2 23  GLUCOSE 126*  BUN 11  CREATININE 1.30  CALCIUM 9.7   Liver Function Tests:  Recent Labs Lab 06/07/14 1436  AST 20  ALT 11  ALKPHOS 88  BILITOT 0.9  PROT 7.5  ALBUMIN 3.9   No results for input(s): LIPASE, AMYLASE in the last 168 hours. No results for input(s): AMMONIA in the last 168 hours. CBC:  Recent Labs Lab 06/07/14 1436  WBC 4.5  NEUTROABS 2.6  HGB 15.5  HCT 47.6  MCV 71.0*  PLT 290   Cardiac Enzymes: No results for input(s): CKTOTAL, CKMB, CKMBINDEX, TROPONINI in the last 168 hours.  BNP (last 3 results) No results for input(s): PROBNP in the  last 8760 hours. CBG: No results for input(s): GLUCAP in the last 168 hours.  Radiological Exams on Admission: Dg Chest 2 View  06/07/2014   CLINICAL DATA:  Altered mental status, failure to thrive  EXAM: CHEST  2 VIEW  COMPARISON:  None.  FINDINGS: The cardiac shadow  is within normal limits. Postsurgical changes are noted. The lungs are well aerated bilaterally without focal infiltrate or sizable effusion. A few scattered calcifications are noted likely related to prior granulomatous disease. No acute bony abnormality is seen.  IMPRESSION: No acute abnormality noted.   Electronically Signed   By: Alcide Clever M.D.   On: 06/07/2014 15:11   Ct Head Wo Contrast  06/07/2014   CLINICAL DATA:  74 year old male with failure to thrive. Confusion and altered mentation  EXAM: CT HEAD WITHOUT CONTRAST  TECHNIQUE: Contiguous axial images were obtained from the base of the skull through the vertex without intravenous contrast.  COMPARISON:  None.  FINDINGS: Low attenuation within the subcortical and periventricular white matter is identified compatible with chronic small vessel ischemic disease. Focal area of low attenuation within the left centrum semiovale is identified consistent with subacute to chronic lacunar infarct, image number 19/series 201. Prominence of the sulci and ventricles are identified consistent with brain atrophy. No evidence for acute brain infarct, hemorrhage or mass. The paranasal sinuses are clear. The mastoid air cells are also clear.  IMPRESSION: 1. Small vessel ischemic disease and brain atrophy. 2. 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   Electronically Signed   By: Signa Kell M.D.   On: 06/07/2014 15:02    EKG: Independently reviewed. 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 Acute encephalopathy. Patient brought into the emergency room after neighbors found him not to be acting himself. Per emergency room staff the stable reported that patient has had a functional decline over  the past several weeks. He has a history of stroke and multiple cardiovascular risk factors as CT scan of brain revealed possible subacute to chronic lacunar infarct. It is possible that cerebrovascular accident may be the cause of his encephalopathy. Will assess for other possibilities as well, check TSH, B-12 level, urinalysis, urine drug screen, alcohol level. Will place patient on CVA protocol as well, start aspirin therapy, neuro checks every 2 hours, obtain transthoracic echocardiogram, MRI, carotid Dopplers.  2. Possible cerebrovascular accident. As noted above patient present with acute encephalopathy, has history of CVA with multiple cardiovascular risk factors including ongoing tobacco abuse. On exam he appeared to have 4-5 muscle strength to right upper extremity with decreased grip to right hand. Will start aspirin therapy, consult neurology, place him on CVA protocol, admit to telemetry with neuro checks every 2 hours.. Restart blood pressure medications. Follow-up MRI of brain, carotid Dopplers, transthoracic echocardiogram, fasting lipid panel, hemoglobin A1c. 3. Malignant hypertension. Patient having systolic blood pressures in the 200s. I strongly suspect patient has not been taking his antihypertensive agents at home. He was given IV hydralazine in the emergency room. Restarting antihypertensive agents. 4. History of alcohol abuse. On exam he did not appear to have clinical signs or symptoms suggestive of alcohol withdrawal that would explain mental status changes. Will check an alcohol level along with urine drug screen  5. Possible pneumonia. Although patient's chest x-ray did not reveal infiltrates he was noted to have productive cough with associated shortness of breath in the emergency room. Will cover with empiric antibiotic therapy, ceftriaxone and azithromycin. Plan to repeat chest x-ray in a.m., reassess need for antimicrobial therapy then. 6. Dyslipidemia. Will check fasting lipid  panel, continue simvastatin 40 mg by mouth daily 7. History of coronary artery disease. Patient denies chest pain, EKG did not show ischemic changes. His troponin was within normal limits in the emergency room. 8. DVT prophylaxis. Subtendinous heparin   Code Status: Full code Family Communication: Family not available Disposition Plan: Anticipate patient will require greater than 2 nights hospitalization  Time spent: 70 minutes  Jeralyn Bennett [facility] Pager [phone]

## 2014-06-07 NOTE — ED Notes (Addendum)
Pt brought in via EMS. Pt A/O only to self. Pt was found at house "feeling bad", by neighbor.  Pt lives home alone. Via neighbor pt has been loosing weight and not eating well, neighbor reports "failure to thrive". Pt lungs have rhonchi bilaterally. V/S B/P 210/128, 87-HR, CBG-94, 98% R/A.

## 2014-06-07 NOTE — Progress Notes (Signed)
Report received from ED 

## 2014-06-07 NOTE — Consult Note (Addendum)
Referring Physician: Vanessa Barbara    Chief Complaint: confusion  HPI: Ian [name] is an 74 y.o. male who is a poor historian and can not tell me why he is here this evening.  It seems that he went to a neighbor's house reporting that he didn't feel well. His neighbor noted patient not acting himself and that he has had a progressive decline over the past several weeks. His neighbor called EMS. He was found to be confused, soiled, and disheveled.  Patient was brought to [facility] ED for further evaluation.   Per chart has a history of CVA, alcohol abuse, hypertension, dyslipidemia.  Date last known well: Unable to determine Time last known well: Unable to determine tPA Given: No: Unable to determine LKW  Past Medical History  Diagnosis Date  . CAD (coronary artery disease)     LIMA to OM2, SVG to OM1, SVG to RCA  . HLD (hyperlipidemia)   . HTN (hypertension)   . BPH (benign prostatic hyperplasia)   . Stroke     Past Surgical History  Procedure Laterality Date  . Coronary artery bypass graft  2001  . Hernia repair      Family History  Problem Relation Age of Onset  . Arthritis    . Hypertension    . Stroke    . Heart attack Mother   . Heart attack Father    Social History:  reports that he has been smoking.  He does not have any smokeless tobacco history on file. He reports that he drinks alcohol. He reports that he does not use illicit drugs.  Allergies: No Known Allergies  Medications:  I have reviewed the patient's current medications. Prior to Admission: Patient reports not being compliant with medications  Prescriptions prior to admission  Medication Sig Dispense Refill Last Dose  . amLODipine (NORVASC) 5 MG tablet TAKE ONE TABLET BY MOUTH EVERY DAY (Patient not taking: Reported on 06/07/2014) 30 tablet 7 Not Taking at Unknown time  . carvedilol (COREG) 6.25 MG tablet Take 1 tablet (6.25 mg total) by mouth 2 (two) times daily with a meal. (Patient not taking: Reported on  06/07/2014) 90 tablet 3 Not Taking at Unknown time  . FLUoxetine (PROZAC) 20 MG tablet Take 1 tablet (20 mg total) by mouth daily. (Patient not taking: Reported on 06/07/2014) 90 tablet 3 Not Taking at Unknown time  . hydrochlorothiazide 25 MG tablet Take 1 tablet (25 mg total) by mouth daily. (Patient not taking: Reported on 06/07/2014) 90 tablet 3 Not Taking at Unknown time  . lisinopril (PRINIVIL,ZESTRIL) 20 MG tablet Take 1 tablet (20 mg total) by mouth daily. (Patient not taking: Reported on 06/07/2014) 90 tablet 3 Not Taking at Unknown time  . Multiple Vitamins-Minerals (MENS ONE DAILY PO) Take by mouth daily.     Not Taking at Unknown time  . nitroGLYCERIN (NITROSTAT) 0.4 MG SL tablet Place 1 tablet (0.4 mg total) under the tongue every 5 (five) minutes as needed for chest pain. 25 tablet 3   . simvastatin (ZOCOR) 40 MG tablet Take 1 tablet (40 mg total) by mouth at bedtime. (Patient not taking: Reported on 06/07/2014) 90 tablet 3 Not Taking at Unknown time    ROS: History obtained from the patient  General ROS: negative for - chills, fatigue, fever, night sweats, weight gain or weight loss Psychological ROS: negative for - behavioral disorder, hallucinations, memory difficulties, mood swings or suicidal ideation Ophthalmic ROS: negative for - blurry vision, double vision, eye pain or loss  of vision ENT ROS: negative for - epistaxis, nasal discharge, oral lesions, sore throat, tinnitus or vertigo Allergy and Immunology ROS: negative for - hives or itchy/watery eyes Hematological and Lymphatic ROS: negative for - bleeding problems, bruising or swollen lymph nodes Endocrine ROS: negative for - galactorrhea, hair pattern changes, polydipsia/polyuria or temperature intolerance Respiratory ROS: negative for - cough, hemoptysis, shortness of breath or wheezing Cardiovascular ROS: negative for - chest pain, dyspnea on exertion, edema or irregular heartbeat Gastrointestinal ROS: negative for -  abdominal pain, diarrhea, hematemesis, nausea/vomiting or stool incontinence Genito-Urinary ROS: negative for - dysuria, hematuria, incontinence or urinary frequency/urgency Musculoskeletal ROS: problem with right lower extremity for "some time now" Neurological ROS: as noted in HPI Dermatological ROS: negative for rash and skin lesion changes  Physical Examination: Blood pressure 172/84, pulse 64, temperature 97.9 F (36.6 C), temperature source Oral, resp. rate 20, height  (1.753 m), SpO2 99 %.  HEENT-  Normocephalic, no lesions, without obvious abnormality.  Normal external eye and conjunctiva.  Normal TM's bilaterally.  Normal auditory canals and external ears. Normal external nose, mucus membranes and septum.  Normal pharynx. Cardiovascular- S1, S2 normal, pulses palpable throughout   Lungs- chest clear, no wheezing, rales, normal symmetric air entry Abdomen- soft, non-tender; bowel sounds normal; no masses,  no organomegaly Extremities- no edema Lymph-no adenopathy palpable Musculoskeletal-no joint tenderness, deformity or swelling Skin-warm and dry, no hyperpigmentation, vitiligo, or suspicious lesions  Neurological Examination Mental Status: Alert, oriented to name and place.  Has difficulty getting other information out and although can answer with one word appropriately can not express a full sentence.   Able to follow 3 step commands with some reinforcement. Cranial Nerves: II: Discs flat bilaterally; Visual fields grossly normal, pupils equal, round, reactive to light and accommodation III,IV, VI: ptosis not present, extra-ocular motions intact bilaterally V,VII: mild left facial droop, facial light touch sensation normal bilaterally VIII: hearing normal bilaterally IX,X: gag reflex present XI: bilateral shoulder shrug XII: midline tongue extension Motor: Right : Upper extremity   5/5    Left:     Upper extremity   5/5  Lower extremity   4-/5     Lower extremity    5/5 Tone and bulk:normal tone throughout; no atrophy noted Sensory: Pinprick and light touch decreased in the right lower extremity Deep Tendon Reflexes: 2+ and symmetric in the upper extremities, absent in the lower extremities Plantars: Right: upgoing   Left: mute Cerebellar: Dysmetria with right finger-to-nose testing and normal heel-to-shin testing bilaterally   Laboratory Studies:  Basic Metabolic Panel:  Recent Labs Lab 06/07/14 1436  NA 140  K 3.8  CL 107  CO2 23  GLUCOSE 126*  BUN 11  CREATININE 1.30  CALCIUM 9.7    Liver Function Tests:  Recent Labs Lab 06/07/14 1436  AST 20  ALT 11  ALKPHOS 88  BILITOT 0.9  PROT 7.5  ALBUMIN 3.9   No results for input(s): LIPASE, AMYLASE in the last 168 hours. No results for input(s): AMMONIA in the last 168 hours.  CBC:  Recent Labs Lab 06/07/14 1436  WBC 4.5  NEUTROABS 2.6  HGB 15.5  HCT 47.6  MCV 71.0*  PLT 290    Cardiac Enzymes: No results for input(s): CKTOTAL, CKMB, CKMBINDEX, TROPONINI in the last 168 hours.  BNP: Invalid input(s): POCBNP  CBG: No results for input(s): GLUCAP in the last 168 hours.  Microbiology: No results found for this or any previous visit.  Coagulation Studies:  Recent  Labs  06/07/14 1436  LABPROT 13.8  INR 1.05    Urinalysis:  Recent Labs Lab 06/07/14 1554  COLORURINE ORANGE*  LABSPEC 1.026  PHURINE 5.5  GLUCOSEU NEGATIVE  HGBUR NEGATIVE  BILIRUBINUR MODERATE*  KETONESUR 15*  PROTEINUR NEGATIVE  UROBILINOGEN 1.0  NITRITE NEGATIVE  LEUKOCYTESUR SMALL*    Lipid Panel:    Component Value Date/Time   CHOL 164 07/17/2010 1401   TRIG 136.0 07/17/2010 1401   HDL 40.80 07/17/2010 1401   CHOLHDL 4 07/17/2010 1401   VLDL 27.2 07/17/2010 1401   LDLCALC 96 07/17/2010 1401    HgbA1C: No results found for: HGBA1C  Urine Drug Screen:     Component Value Date/Time   LABOPIA NONE DETECTED 06/07/2014 1554   COCAINSCRNUR NONE DETECTED 06/07/2014 1554    LABBENZ NONE DETECTED 06/07/2014 1554   AMPHETMU NONE DETECTED 06/07/2014 1554   THCU NONE DETECTED 06/07/2014 1554   LABBARB NONE DETECTED 06/07/2014 1554    Alcohol Level: No results for input(s): ETH in the last 168 hours.  Other results: EKG: sinus rhythm at 87bpm, premature atrial complexes.  Imaging: Dg Chest 2 View  06/07/2014   CLINICAL DATA:  Altered mental status, failure to thrive  EXAM: CHEST  2 VIEW  COMPARISON:  None.  FINDINGS: The cardiac shadow is within normal limits. Postsurgical changes are noted. The lungs are well aerated bilaterally without focal infiltrate or sizable effusion. A few scattered calcifications are noted likely related to prior granulomatous disease. No acute bony abnormality is seen.  IMPRESSION: No acute abnormality noted.   Electronically Signed   By: Alcide CleverMark  Lukens M.D.   On: 06/07/2014 15:11   Ct Head Wo Contrast  06/07/2014   CLINICAL DATA:  74 year old male with failure to thrive. Confusion and altered mentation  EXAM: CT HEAD WITHOUT CONTRAST  TECHNIQUE: Contiguous axial images were obtained from the base of the skull through the vertex without intravenous contrast.  COMPARISON:  None.  FINDINGS: Low attenuation within the subcortical and periventricular white matter is identified compatible with chronic small vessel ischemic disease. Focal area of low attenuation within the left centrum semiovale is identified consistent with subacute to chronic lacunar infarct, image number 19/series 201. Prominence of the sulci and ventricles are identified consistent with brain atrophy. No evidence for acute brain infarct, hemorrhage or mass. The paranasal sinuses are clear. The mastoid air cells are also clear.  IMPRESSION: 1. Small vessel ischemic disease and brain atrophy. 2. 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   Electronically Signed   By: Signa Kellaylor  Stroud M.D.   On: 06/07/2014 15:02    Assessment: 74 y.o. male presenting with an altered  mental state.  He has some focality on his neurological examination but he has had a stroke in the past and it is unclear what is his baseline.  Head CT personally reviewed and shows a possible subacute infarct in the left centrum semiovale.  Patient noncompliant with home medications.  Was not on antiplatelet therapy.  Further work up recommended.    Stroke Risk Factors - hyperlipidemia, hypertension and smoking  Plan: 1. HgbA1c, fasting lipid panel 2. MRI, MRA  of the brain without contrast 3. PT consult, OT consult, Speech consult 4. Echocardiogram 5. Carotid dopplers 6. Prophylactic therapy-Antiplatelet med: Aspirin - dose 325mg  daily 7. NPO until RN stroke swallow screen 8. Telemetry monitoring 9. Frequent neuro checks    Thana FarrLeslie Orley Lawry, MD [facility] [phone] 06/07/2014, 5:58 PM

## 2014-06-07 NOTE — Progress Notes (Signed)
Ian [name] updated Hakeem Trotter(neighbor) by phone of patient new room and plan of care.

## 2014-06-08 ENCOUNTER — Inpatient Hospital Stay (HOSPITAL_COMMUNITY): Payer: Medicare Other

## 2014-06-08 ENCOUNTER — Encounter (HOSPITAL_COMMUNITY): Payer: Self-pay | Admitting: Radiology

## 2014-06-08 DIAGNOSIS — Z8673 Personal history of transient ischemic attack (TIA), and cerebral infarction without residual deficits: Secondary | ICD-10-CM

## 2014-06-08 DIAGNOSIS — R410 Disorientation, unspecified: Secondary | ICD-10-CM

## 2014-06-08 DIAGNOSIS — I6789 Other cerebrovascular disease: Secondary | ICD-10-CM

## 2014-06-08 DIAGNOSIS — I639 Cerebral infarction, unspecified: Secondary | ICD-10-CM

## 2014-06-08 LAB — BASIC METABOLIC PANEL
Anion gap: 14 (ref 5–15)
BUN: 11 mg/dL (ref 6–23)
CHLORIDE: 108 mmol/L (ref 96–112)
CO2: 19 mmol/L (ref 19–32)
Calcium: 9.1 mg/dL (ref 8.4–10.5)
Creatinine, Ser: 1.2 mg/dL (ref 0.50–1.35)
GFR calc non Af Amer: 59 mL/min — ABNORMAL LOW (ref >90)
GFR, EST AFRICAN AMERICAN: 68 mL/min — AB (ref >90)
Glucose, Bld: 91 mg/dL (ref 70–99)
Potassium: 4 mmol/L (ref 3.5–5.1)
SODIUM: 141 mmol/L (ref 135–145)

## 2014-06-08 LAB — CBC
HEMATOCRIT: 45 % (ref 39.0–52.0)
Hemoglobin: 14.5 g/dL (ref 13.0–17.0)
MCH: 23 pg — AB (ref 26.0–34.0)
MCHC: 32.2 g/dL (ref 30.0–36.0)
MCV: 71.4 fL — ABNORMAL LOW (ref 78.0–100.0)
PLATELETS: 280 10*3/uL (ref 150–400)
RBC: 6.3 MIL/uL — ABNORMAL HIGH (ref 4.22–5.81)
RDW: 15.4 % (ref 11.5–15.5)
WBC: 5.9 10*3/uL (ref 4.0–10.5)

## 2014-06-08 LAB — HEMOGLOBIN A1C
HEMOGLOBIN A1C: 5.7 % — AB (ref <5.7)
MEAN PLASMA GLUCOSE: 117 mg/dL — AB (ref <117)

## 2014-06-08 LAB — FOLATE: Folate: 7.4 ng/mL

## 2014-06-08 LAB — LIPID PANEL
Cholesterol: 175 mg/dL (ref 0–200)
HDL: 25 mg/dL — ABNORMAL LOW (ref >39)
LDL CALC: 133 mg/dL — AB (ref 0–99)
TRIGLYCERIDES: 85 mg/dL (ref <150)
Total CHOL/HDL Ratio: 7 RATIO
VLDL: 17 mg/dL (ref 0–40)

## 2014-06-08 LAB — VITAMIN B12: Vitamin B-12: 666 pg/mL (ref 211–911)

## 2014-06-08 MED ORDER — AMLODIPINE BESYLATE 5 MG PO TABS
5.0000 mg | ORAL_TABLET | Freq: Every day | ORAL | Status: DC
  Administered 2014-06-08 – 2014-06-11 (×4): 5 mg via ORAL
  Filled 2014-06-08 (×4): qty 1

## 2014-06-08 MED ORDER — SODIUM CHLORIDE 0.9 % IJ SOLN
10.0000 mL | INTRAMUSCULAR | Status: DC | PRN
  Administered 2014-06-09: 10 mL
  Filled 2014-06-08: qty 40

## 2014-06-08 MED ORDER — CLOPIDOGREL BISULFATE 75 MG PO TABS
75.0000 mg | ORAL_TABLET | Freq: Every day | ORAL | Status: DC
  Administered 2014-06-08 – 2014-06-11 (×4): 75 mg via ORAL
  Filled 2014-06-08 (×4): qty 1

## 2014-06-08 MED ORDER — SIMVASTATIN 20 MG PO TABS
20.0000 mg | ORAL_TABLET | Freq: Every day | ORAL | Status: DC
  Administered 2014-06-08 – 2014-06-10 (×3): 20 mg via ORAL
  Filled 2014-06-08 (×3): qty 1

## 2014-06-08 MED ORDER — ASPIRIN EC 81 MG PO TBEC
81.0000 mg | DELAYED_RELEASE_TABLET | Freq: Every day | ORAL | Status: DC
  Administered 2014-06-09 – 2014-06-11 (×3): 81 mg via ORAL
  Filled 2014-06-08 (×3): qty 1

## 2014-06-08 MED ORDER — LISINOPRIL 20 MG PO TABS
20.0000 mg | ORAL_TABLET | Freq: Every day | ORAL | Status: DC
  Administered 2014-06-08 – 2014-06-11 (×4): 20 mg via ORAL
  Filled 2014-06-08 (×4): qty 1

## 2014-06-08 MED ORDER — HYDROCHLOROTHIAZIDE 25 MG PO TABS
25.0000 mg | ORAL_TABLET | Freq: Every day | ORAL | Status: DC
  Administered 2014-06-08 – 2014-06-11 (×4): 25 mg via ORAL
  Filled 2014-06-08 (×4): qty 1

## 2014-06-08 MED ORDER — IOHEXOL 350 MG/ML SOLN
80.0000 mL | Freq: Once | INTRAVENOUS | Status: AC | PRN
  Administered 2014-06-08: 50 mL via INTRAVENOUS

## 2014-06-08 MED ORDER — THIAMINE HCL 100 MG/ML IJ SOLN
Freq: Every day | INTRAMUSCULAR | Status: DC
  Administered 2014-06-10 – 2014-06-11 (×2): via INTRAVENOUS
  Filled 2014-06-08 (×2): qty 50

## 2014-06-08 MED ORDER — THIAMINE HCL 100 MG/ML IJ SOLN
500.0000 mg | Freq: Every day | INTRAVENOUS | Status: AC
  Administered 2014-06-08 – 2014-06-09 (×2): 500 mg via INTRAVENOUS
  Filled 2014-06-08 (×2): qty 5

## 2014-06-08 MED ORDER — SODIUM CHLORIDE 0.9 % IJ SOLN: 10.0000 mL | Freq: Two times a day (BID) | INTRAMUSCULAR | Status: DC

## 2014-06-08 MED ORDER — THIAMINE HCL 100 MG/ML IJ SOLN: 250.0000 mg | Freq: Every day | INTRAMUSCULAR | Status: DC

## 2014-06-08 MED ORDER — THIAMINE HCL 100 MG/ML IJ SOLN: 100.0000 mg | Freq: Every day | INTRAMUSCULAR | Status: DC

## 2014-06-08 NOTE — Progress Notes (Signed)
STROKE TEAM PROGRESS NOTE   HISTORY OF PRESENT ILLNESS Ian [name] is an 74 y.o. male who is a poor historian and can not tell me why he is here this evening. It seems that he went to a neighbor's house reporting that he didn't feel well. His neighbor noted patient not acting himself and that he has had a progressive decline over the past several weeks. His neighbor called EMS. He was found to be confused, soiled, and disheveled. Patient was brought to [facility] ED for further evaluation.  Per chart has a history of CVA, alcohol abuse, hypertension, dyslipidemia.  Date last known well: Unable to determine Time last known well: Unable to determine tPA Given: No: Unable to determine LKW  SUBJECTIVE (INTERVAL HISTORY) No family is at the bedside.  Overall he feels his condition is unchanged. He seems to have baseline dementia. Not able to tell me why he is here. Get collateral information from brother and neighbor that he was heavy drinker until 3 months ago. Not sure exactly cognitive baseline. His punctate stroke in MRI not able to explain the cognitive impairment.   OBJECTIVE Temp:  [97.5 F (36.4 C)-98.2 F (36.8 C)] 97.5 F (36.4 C) (01/23 1430) Pulse Rate:  [54-73] 56 (01/23 1430) Cardiac Rhythm:  [-]  Resp:  [18-20] 18 (01/23 1430) BP: (142-196)/(73-91) 190/73 mmHg (01/23 1430) SpO2:  [97 %-100 %] 100 % (01/23 1430) Weight:  [191 lb 3.2 oz (86.728 kg)] 191 lb 3.2 oz (86.728 kg) (01/22 2128)  No results for input(s): GLUCAP in the last 168 hours.  Recent Labs Lab 06/07/14 1436 06/07/14 1745 06/08/14 0451  NA 140  --  141  K 3.8  --  4.0  CL 107  --  108  CO2 23  --  19  GLUCOSE 126*  --  91  BUN 11  --  11  CREATININE 1.30 1.22 1.20  CALCIUM 9.7  --  9.1    Recent Labs Lab 06/07/14 1436  AST 20  ALT 11  ALKPHOS 88  BILITOT 0.9  PROT 7.5  ALBUMIN 3.9    Recent Labs Lab 06/07/14 1436 06/07/14 1745 06/08/14 0451  WBC 4.5 6.1 5.9  NEUTROABS 2.6  --   --    HGB 15.5 14.9 14.5  HCT 47.6 45.1 45.0  MCV 71.0* 70.1* 71.4*  PLT 290 284 280   No results for input(s): CKTOTAL, CKMB, CKMBINDEX, TROPONINI in the last 168 hours.  Recent Labs  06/07/14 1436  LABPROT 13.8  INR 1.05    Recent Labs  06/07/14 1554  COLORURINE ORANGE*  LABSPEC 1.026  PHURINE 5.5  GLUCOSEU NEGATIVE  HGBUR NEGATIVE  BILIRUBINUR MODERATE*  KETONESUR 15*  PROTEINUR NEGATIVE  UROBILINOGEN 1.0  NITRITE NEGATIVE  LEUKOCYTESUR SMALL*       Component Value Date/Time   CHOL 175 06/08/2014 0451   TRIG 85 06/08/2014 0451   HDL 25* 06/08/2014 0451   CHOLHDL 7.0 06/08/2014 0451   VLDL 17 06/08/2014 0451   LDLCALC 133* 06/08/2014 0451   Lab Results  Component Value Date   HGBA1C 5.7* 06/08/2014      Component Value Date/Time   LABOPIA NONE DETECTED 06/07/2014 1554   COCAINSCRNUR NONE DETECTED 06/07/2014 1554   LABBENZ NONE DETECTED 06/07/2014 1554   AMPHETMU NONE DETECTED 06/07/2014 1554   THCU NONE DETECTED 06/07/2014 1554   LABBARB NONE DETECTED 06/07/2014 1554    No results for input(s): ETH in the last 168 hours.  I have personally reviewed the  radiological images below and agree with the radiology interpretations.  X-ray Chest Pa And Lateral  06/08/2014   IMPRESSION: No acute abnormality noted.     Dg Chest 2 View  06/07/2014   IMPRESSION: No acute abnormality noted.     Ct Head Wo Contrast  06/07/2014   IMPRESSION: 1. Small vessel ischemic disease and brain atrophy. 2. 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      Mri and Mra Brain Wo Contrast  06/07/2014    IMPRESSION: 1. Multiple acute punctate infarcts in the anterior and posterior circulation. This pattern suggests a central embolic process. 2. Remote infarcts of the brainstem, bilateral cerebellum, and central left MCA territory. 3. Extensive intracranial atherosclerosis with advanced stenoses of the left V4, upper division right MCA branch, right cavernous carotid, and  left P1. 4. 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      CTA neck pending  2D Echocardiogram  - Left ventricle: The cavity size was normal. There was moderate concentric hypertrophy. Systolic function was normal. The estimated ejection fraction was in the range of 60% to 65%. Wall motion was normal; there were no regional wall motion abnormalities. Doppler parameters are consistent with abnormal left ventricular relaxation (grade 1 diastolic dysfunction). The E/e&' ratio is between 8-15, suggesting indeterminate LV filling pressure. - Aortic valve: Mildly calcified leaflets. There was no stenosis. There was trace to mild regurgitation. - Mitral valve: Calcified annulus. There was trivial regurgitation. - Left atrium: The atrium was normal in size. - Right atrium: The atrium was mildly dilated. - Tricuspid valve: There was mild regurgitation. - Pulmonary arteries: PA peak pressure: 31 mm Hg (S). - Inferior vena cava: The vessel was normal in size. The respirophasic diameter changes were in the normal range (>= 50%), consistent with normal central venous pressure.  Impressions: - LVEF 60-65%, normal wall motion, moderate LVH, mild RAE, trace to mild AI with calcified aortic valve but no stenosis, mild TR, RVSP 31 mmHg, diastolic dysfunction.  EKG  normal sinus rhythm. For complete results please see formal report.  PHYSICAL EXAM  Temp:  [97.5 F (36.4 C)-98.2 F (36.8 C)] 97.5 F (36.4 C) (01/23 1430) Pulse Rate:  [54-73] 56 (01/23 1430) Resp:  [18-20] 18 (01/23 1430) BP: (142-196)/(73-91) 190/73 mmHg (01/23 1430) SpO2:  [97 %-100 %] 100 % (01/23 1430) Weight:  [191 lb 3.2 oz (86.728 kg)] 191 lb 3.2 oz (86.728 kg) (01/22 2128)  General - Well nourished, well developed, in no apparent distress.  Ophthalmologic - not cooperative on exam.  Cardiovascular - Regular rate and rhythm with no murmur.  Neck - supple, no  carotid bruits  Mental Status -  Awake alert but not orientated to time, place and people. He orientated to self and age. Marland Kitchen Paucity of language, short answers for questions. Comprehension grossly intact, naming impaired but repetition intact. Recent and remote memory were 3/3 registration and 1/3 delayed recall. Fund of Knowledge was assessed and was impaired.  Cranial Nerves II - XII - II - Visual field intact OU. III, IV, VI - Extraocular movements intact. V - Facial sensation intact bilaterally. VII - Facial movement intact bilaterally. VIII - Hearing & vestibular intact bilaterally. X - Palate elevates symmetrically. XI - Chin turning & shoulder shrug intact bilaterally. XII - Tongue protrusion intact.  Motor Strength - The patient's strength was 5/5 UEs and 4/5 LEs and pronator drift was absent.  Bulk was normal and fasciculations were absent.   Motor  Tone - Muscle tone was assessed at the neck and appendages and was normal.  Reflexes - The patient's reflexes were symmetrical in all extremities and he had no pathological reflexes.  Sensory - Light touch, temperature/pinprick were assessed and were symmetrical.    Coordination - The patient had normal movements in the hands with no ataxia or dysmetria.  Tremor was absent.  Gait and Station - not tested due to safety concerns.   ASSESSMENT/PLAN Mr. Ian [name] is a 74 y.o. male with history of HTN and HLD, CAD s/p CABG, CVA in the past, alcohol abuse admitted for general condition decline for several weeks with cognitive impairment. Symptoms unchanged.    Stroke:  Bilateral punctate PCA territory and cerebellar infarcts, more consistent with cardioembolic pattern, however due to severe posterior vessel stenosis, large vessel athero still in ddx. However, this changes not able to explain pt dementia or encephalopathy.   MRI  Bilateral punctate PCA territory and cerebellar infarcts  MRA  Multiple intracranial large vessel high  grade stenosis  CTA neck pending  2D Echo  unremarkable  LDL 133, not at goal  HgbA1c 5.7  Heparin subq for VTE prophylaxis  Diet Heart   no antithrombotic prior to admission, now on aspirin 81 mg orally every day and clopidogrel 75 mg orally every day for 3 months and then plavix alone  Patient counseled to be compliant with his antithrombotic medications  Ongoing aggressive stroke risk factor management  Therapy recommendations:  pending  Disposition:  Pending  Hx of stroke  On MRI in 2003, it was found that he has punctate stroke in the left MCA territory  Also found to have left MCA territory encephalomalacia, concerning for left MCA stroke but traumatic brain injury not able to rule out given hx of heavy alcohol  This time MRI showed Remote infarcts of the brainstem, bilateral cerebellum, consistent with his posterior circulation high grade stenosis.  Stroke etiology - cardioembolic vs. athero  Recommend continue dural antiplatelet but consider 30 day outpt cardiac event monitoring  Continue tele to evaluate afib.  Dementia vs. Wernicke's Encephalopathy  May has baseline dementia as MRI showed confluent white matter T2 changes  Pt has hx of heavy drinking, wernicke's encephalopathy is in ddx  Thiamine supplement of wernicke's treatment.   Hypertension  Home meds:   novasc and amlodipine and HCTZ, but not compliant Permissive hypertension (OK if <220/120) for 24-48 hours post stroke and then gradually normalized within 5-7 days. Currently on norvasc and lisinopril and HCTZ  Unstable, still on the high end  Patient counseled to be compliant with his blood pressure medications  Hyperlipidemia  Home meds:  zocor 40, but pt not compliant  Currently resume zocor 40  LDL 133, goal < 70  Continue statin at discharge  Other Stroke Risk Factors  Advanced age  ETOH use   Hx stroke/TIA  Coronary artery disease  Hospital day # 1  Marvel PlanJindong Orpha Dain, MD  PhD Stroke Neurology 06/08/2014 6:18 PM    To contact Stroke Continuity provider, please refer to [email or web address]. After hours, contact General Neurology

## 2014-06-08 NOTE — Progress Notes (Signed)
Per CT tech, pt's IV infiltrated, no IV access to perform CT angio.  Nurse notified MD, Dr. Randol KernElgergawy via text msg.  Will monitor for MD's return call/orders   Andrew AuVafiadis, Alayasia Breeding I 06/08/2014 1:46 PM

## 2014-06-08 NOTE — Progress Notes (Signed)
Echocardiogram 2D Echocardiogram has been performed.  Ian [name] 06/08/2014, 10:44 AM

## 2014-06-08 NOTE — Progress Notes (Signed)
SLP Cancellation Note  Patient Details Name: Artis FlockDavid Fargo MRN: [id number] DOB: 09/16/1940   Cancelled treatment:        Out of room earlier this morning when attempted speech-language-cognitive eval. Will do as soon as able   Royce MacadamiaLitaker, Ammon Muscatello Willis 06/08/2014, 3:22 PM

## 2014-06-08 NOTE — Progress Notes (Signed)
Patient Demographics  Ian [name], is a 74 y.o. male, DOB - 23-Feb-1941, ZOX:[id number]  Admit date - 06/07/2014   Admitting Physician Jeralyn Bennett, MD  Outpatient Primary MD for the patient is Sanda Linger, MD  LOS - 1   Chief Complaint  Patient presents with  . Altered Mental Status      Admission history of present illness/brief narrative: Ian [name] is a 74 y.o. male with a past medical history of CVA, alcohol abuse, hypertension, dyslipidemia, who was brought to the emergency department at [facility] by EMS for mental status changes.he went to a neighbors house reporting that he didn't feel well. He was found to be confused, soiled, and disheveled. Per medical records patient has history of alcoholism as patient reports his last drink was greater than one month ago. Workup in the emergency room included a CT scan of brain which showed subacute to chronic left centrum semiovale lacunar infarct along with small vessel ischemic changes. He was found to be hypertensive having systolic blood pressures in the 190s. Discussed with family who reported patient had history of alcohol abuse, but they think he did stop drinking over the last 2-3 months.   Subjective:   Ian [name] today has, No headache, No chest pain, No abdominal pain , but he is confused, poor historian.  Assessment & Plan    Principal Problem:   Acute encephalopathy Active Problems:   Coronary atherosclerosis   History of cardiovascular disorder   Tobacco abuse   Confusion   Malignant HTN with heart disease, w/o CHF, w/o chronic kidney disease   Alcohol abuse   CVA (cerebral vascular accident)  Altered mental status/encephalopathy:  - This is most likely related to acute CVA, but as well there is likely an other contributing factors, including dementia at  baseline, as evident on imaging, as well patient with known history of call abuse, with possibility of Wernick encephalopathy as well. - Will start on IV thiamine, - TSH, B-12, folate within normal limit. - We'll check RPR  Acute CVA - MRI brain showing multiple acute punctate infarcts in the anterior and posterior circulation, with remote infarcts in the brainstem, bilateral cerebellum, and central left MCA territory. - MRA showing extensive intracranial atherosclerosis - Continue with dual antiplatelet therapy aspirin and Plavix - LDL is 133, will continue on simvastatin -CT angiogram head and neck pending -2-D echo EF 65%, with grade 1 diastolic dysfunction. - Carotid Doppler pending  Hypertension - Uncontrolled, will allow for permissive hypertension, and aim for gradual blood pressure control  Questionable Pneumonia - Repeat chest x-ray doesn't show any evidence of opacity or infiltrate, will discontinue IV Rocephin and azithromycin.  Dyslipidemia - Continue with statin  History of alcohol abuse -family and neighbor report patient stopped drinking a few weeks ago, unlikely to go into withdrawals, started on IV thiamine for possible Wernicke's.  Code Status: Full  Family Communication: spoke with family over the phone [phone]  Disposition Plan: Most likely skilled nursing facility when stable   Procedures  None   Consults  neurology   Medications  Scheduled Meds: .  stroke: mapping our early stages of recovery book   Does not apply Once  . [START ON 06/09/2014] aspirin EC  81  mg Oral Daily  . azithromycin  500 mg Intravenous Q24H  . carvedilol  6.25 mg Oral BID WC  . cefTRIAXone (ROCEPHIN)  IV  1 g Intravenous Q24H  . clopidogrel  75 mg Oral Daily  . heparin  5,000 Units Subcutaneous 3 times per day  . simvastatin  40 mg Oral QHS  . [START ON 06/10/2014] thiamine IV  250 mg Intravenous Daily  . thiamine IV  500 mg Intravenous Daily   Continuous Infusions: .  sodium chloride 75 mL/hr at 06/07/14 1812   PRN Meds:.acetaminophen **OR** acetaminophen, alum & mag hydroxide-simeth, labetalol, morphine injection, ondansetron **OR** ondansetron (ZOFRAN) IV, oxyCODONE  DVT Prophylaxis  Heparin -   Lab Results  Component Value Date   PLT 280 06/08/2014    Antibiotics    Anti-infectives    Start     Dose/Rate Route Frequency Ordered Stop   06/07/14 1800  cefTRIAXone (ROCEPHIN) 1 g in dextrose 5 % 50 mL IVPB - Premix     1 g100 mL/hr over 30 Minutes Intravenous Every 24 hours 06/07/14 1724     06/07/14 1800  azithromycin (ZITHROMAX) 500 mg in dextrose 5 % 250 mL IVPB     500 mg250 mL/hr over 60 Minutes Intravenous Every 24 hours 06/07/14 1725     06/07/14 1715  cefTRIAXone (ROCEPHIN) 1 g in dextrose 5 % 50 mL IVPB  Status:  Discontinued     1 g100 mL/hr over 30 Minutes Intravenous Every 24 hours 06/07/14 1709 06/07/14 1724   06/07/14 1715  azithromycin (ZITHROMAX) 500 mg in dextrose 5 % 250 mL IVPB  Status:  Discontinued     500 mg250 mL/hr over 60 Minutes Intravenous Every 24 hours 06/07/14 1709 06/07/14 1724          Objective:   Filed Vitals:   06/08/14 0100 06/08/14 0300 06/08/14 0500 06/08/14 1058  BP: 163/74 150/82 171/73 196/81  Pulse: 61 57 54 59  Temp: 97.9 F (36.6 C) 97.6 F (36.4 C) 97.7 F (36.5 C) 98.2 F (36.8 C)  TempSrc: Oral Oral Oral Oral  Resp: 19 20 18 18   Height:      Weight:      SpO2: 97% 100% 100% 100%    Wt Readings from Last 3 Encounters:  06/07/14 86.728 kg (191 lb 3.2 oz)  09/17/10 88.905 kg (196 lb)  07/17/10 94.348 kg (208 lb)     Intake/Output Summary (Last 24 hours) at 06/08/14 1326 Last data filed at 06/08/14 0426  Gross per 24 hour  Intake      0 ml  Output    225 ml  Net   -225 ml     Physical Exam  Awake Alert, confused, No new F.N deficits, Normal affect [place].AT,PERRAL Supple Neck,No JVD, No cervical lymphadenopathy appriciated.  Symmetrical Chest wall movement, Good air movement  bilaterally, CTAB RRR,No Gallops,Rubs or new Murmurs, No Parasternal Heave +ve B.Sounds, Abd Soft, No tenderness, No organomegaly appriciated, No rebound - guarding or rigidity. No Cyanosis, Clubbing or edema, No new Rash or bruise    Data Review   Micro Results No results found for this or any previous visit (from the past 240 hour(s)).  Radiology Reports X-ray Chest Pa And Lateral  06/08/2014   CLINICAL DATA:  Acute encephalopathy  EXAM: CHEST  2 VIEW  COMPARISON:  06/07/2014  FINDINGS: Cardiac shadow is stable. Postsurgical changes are again noted. The lungs are clear. No focal infiltrate or sizable effusion is seen. No bony abnormality is  noted.  IMPRESSION: No acute abnormality noted.   Electronically Signed   By: Alcide Clever M.D.   On: 06/08/2014 09:27   Dg Chest 2 View  06/07/2014   CLINICAL DATA:  Altered mental status, failure to thrive  EXAM: CHEST  2 VIEW  COMPARISON:  None.  FINDINGS: The cardiac shadow is within normal limits. Postsurgical changes are noted. The lungs are well aerated bilaterally without focal infiltrate or sizable effusion. A few scattered calcifications are noted likely related to prior granulomatous disease. No acute bony abnormality is seen.  IMPRESSION: No acute abnormality noted.   Electronically Signed   By: Alcide Clever M.D.   On: 06/07/2014 15:11   Ct Head Wo Contrast  06/07/2014   CLINICAL DATA:  74 year old male with failure to thrive. Confusion and altered mentation  EXAM: CT HEAD WITHOUT CONTRAST  TECHNIQUE: Contiguous axial images were obtained from the base of the skull through the vertex without intravenous contrast.  COMPARISON:  None.  FINDINGS: Low attenuation within the subcortical and periventricular white matter is identified compatible with chronic small vessel ischemic disease. Focal area of low attenuation within the left centrum semiovale is identified consistent with subacute to chronic lacunar infarct, image number 19/series 201.  Prominence of the sulci and ventricles are identified consistent with brain atrophy. No evidence for acute brain infarct, hemorrhage or mass. The paranasal sinuses are clear. The mastoid air cells are also clear.  IMPRESSION: 1. Small vessel ischemic disease and brain atrophy. 2. 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   Electronically Signed   By: Signa Kell M.D.   On: 06/07/2014 15:02   Mr Brain Wo Contrast  06/07/2014   CLINICAL DATA:  Acute encephalopathy.  EXAM: MRI HEAD WITHOUT CONTRAST  MRA HEAD WITHOUT CONTRAST  TECHNIQUE: Multiplanar, multiecho pulse sequences of the brain and surrounding structures were obtained without intravenous contrast. Angiographic images of the head were obtained using MRA technique without contrast.  COMPARISON:  Head CT from earlier the same day  FINDINGS: MRI HEAD FINDINGS  Calvarium and upper cervical spine: No focal marrow signal abnormality.  The posterior ring of C1 is hypoplastic, resulting in deformity of the posterior spinal cord, similar to increased from 2008. Degenerative endplate ridging C3-4 also deforms the ventral cord at this level.  Orbits: No significant findings.  Sinuses: Clear. Mastoid and middle ears are clear.  Brain: Numerous punctate acute infarcts in the infra and supratentorial brain. Infratentorially, the infarcts are distributed throughout the bilateral cerebellum, up to 9 mm in diameter on the right. There could be a punctate infarct in the left upper pons, but small size limits certainty. The cerebral infarcts are clustered in the occipital and parietal cortex, although small infarcts are present in the bilateral frontal lobes. No hemorrhagic conversion. Arterial findings discussed below.  There is extensive remote ischemic injury. In the posterior fossa, there have been remote infarcts of the bilateral pons and cerebellar hemispheres. There has been a large central MCA territory infarct on the left with loss of the insula  and opercular cortex. An associated lacune in the left corona radiata is also a remote. Ischemic gliosis is confluent throughout the bilateral cerebral white matter.  There 2 small foci of remote hemorrhage in the right parietal cortex.  No hydrocephalus, mass lesion, or shift.  MRA HEAD FINDINGS  Posterior circulation: The vertebral arteries are balanced and small. There is a moderate to high-grade stenosis of the left V4 segment proximal to the patent PICA.  Afterward, the left vertebral artery is diminutive with poor flow. The majority of the basilar flow comes from the right vertebral artery which has a hypoplastic PICA and dominant AICA. The basilar artery is hypoplastic in the setting of large posterior communicating arteries, likely exacerbated by inflow disease. On conventional imaging there is no signal change to suggest superimposed dissection. There is a high-grade stenosis of the left P1 segment, well compensated by large posterior communicating artery. Moderate multi focal atherosclerotic narrowing of the bilateral PCAs.  Anterior circulation: Notable anatomic variants include large posterior communicating arteries, hypoplastic right A1, and duplicated anterior communicating arteries.  The right cavernous carotid has atherosclerosis along its entirety, with at least moderate stenosis at its mid portion. The upper division of the right MCA has a high-grade short-segment stenosis just beyond the bifurcation.  There is atherosclerotic irregularity of the left cavernous carotid without notable narrowing. The left A1 and MCA branches are widely patent.  There is no evidence of aneurysm (when accounting for duplicated anterior communicating artery).  Unusual prominence of right mesencephalic and prepontine vein with time-of-flight signal. Despite this, there is no distention typical of an arterial venous malformation or fistula.  IMPRESSION: 1. Multiple acute punctate infarcts in the anterior and posterior  circulation. This pattern suggests a central embolic process. 2. Remote infarcts of the brainstem, bilateral cerebellum, and central left MCA territory. 3. Extensive intracranial atherosclerosis with advanced stenoses of the left V4, upper division right MCA branch, right cavernous carotid, and left P1. 4. 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   Electronically Signed   By: Tiburcio Pea M.D.   On: 06/07/2014 20:58   Mr Maxine Glenn Head/brain Wo Cm  06/07/2014   CLINICAL DATA:  Acute encephalopathy.  EXAM: MRI HEAD WITHOUT CONTRAST  MRA HEAD WITHOUT CONTRAST  TECHNIQUE: Multiplanar, multiecho pulse sequences of the brain and surrounding structures were obtained without intravenous contrast. Angiographic images of the head were obtained using MRA technique without contrast.  COMPARISON:  Head CT from earlier the same day  FINDINGS: MRI HEAD FINDINGS  Calvarium and upper cervical spine: No focal marrow signal abnormality.  The posterior ring of C1 is hypoplastic, resulting in deformity of the posterior spinal cord, similar to increased from 2008. Degenerative endplate ridging C3-4 also deforms the ventral cord at this level.  Orbits: No significant findings.  Sinuses: Clear. Mastoid and middle ears are clear.  Brain: Numerous punctate acute infarcts in the infra and supratentorial brain. Infratentorially, the infarcts are distributed throughout the bilateral cerebellum, up to 9 mm in diameter on the right. There could be a punctate infarct in the left upper pons, but small size limits certainty. The cerebral infarcts are clustered in the occipital and parietal cortex, although small infarcts are present in the bilateral frontal lobes. No hemorrhagic conversion. Arterial findings discussed below.  There is extensive remote ischemic injury. In the posterior fossa, there have been remote infarcts of the bilateral pons and cerebellar hemispheres. There has been a large central MCA territory  infarct on the left with loss of the insula and opercular cortex. An associated lacune in the left corona radiata is also a remote. Ischemic gliosis is confluent throughout the bilateral cerebral white matter.  There 2 small foci of remote hemorrhage in the right parietal cortex.  No hydrocephalus, mass lesion, or shift.  MRA HEAD FINDINGS  Posterior circulation: The vertebral arteries are balanced and small. There is a moderate to high-grade stenosis of the left V4 segment  proximal to the patent PICA. Afterward, the left vertebral artery is diminutive with poor flow. The majority of the basilar flow comes from the right vertebral artery which has a hypoplastic PICA and dominant AICA. The basilar artery is hypoplastic in the setting of large posterior communicating arteries, likely exacerbated by inflow disease. On conventional imaging there is no signal change to suggest superimposed dissection. There is a high-grade stenosis of the left P1 segment, well compensated by large posterior communicating artery. Moderate multi focal atherosclerotic narrowing of the bilateral PCAs.  Anterior circulation: Notable anatomic variants include large posterior communicating arteries, hypoplastic right A1, and duplicated anterior communicating arteries.  The right cavernous carotid has atherosclerosis along its entirety, with at least moderate stenosis at its mid portion. The upper division of the right MCA has a high-grade short-segment stenosis just beyond the bifurcation.  There is atherosclerotic irregularity of the left cavernous carotid without notable narrowing. The left A1 and MCA branches are widely patent.  There is no evidence of aneurysm (when accounting for duplicated anterior communicating artery).  Unusual prominence of right mesencephalic and prepontine vein with time-of-flight signal. Despite this, there is no distention typical of an arterial venous malformation or fistula.  IMPRESSION: 1. Multiple acute  punctate infarcts in the anterior and posterior circulation. This pattern suggests a central embolic process. 2. Remote infarcts of the brainstem, bilateral cerebellum, and central left MCA territory. 3. Extensive intracranial atherosclerosis with advanced stenoses of the left V4, upper division right MCA branch, right cavernous carotid, and left P1. 4. 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   Electronically Signed   By: Tiburcio Pea M.D.   On: 06/07/2014 20:58    CBC  Recent Labs Lab 06/07/14 1436 06/07/14 1745 06/08/14 0451  WBC 4.5 6.1 5.9  HGB 15.5 14.9 14.5  HCT 47.6 45.1 45.0  PLT 290 284 280  MCV 71.0* 70.1* 71.4*  MCH 23.1* 23.2* 23.0*  MCHC 32.6 33.0 32.2  RDW 14.9 15.0 15.4  LYMPHSABS 1.1  --   --   MONOABS 0.8  --   --   EOSABS 0.0  --   --   BASOSABS 0.0  --   --     Chemistries   Recent Labs Lab 06/07/14 1436 06/07/14 1745 06/08/14 0451  NA 140  --  141  K 3.8  --  4.0  CL 107  --  108  CO2 23  --  19  GLUCOSE 126*  --  91  BUN 11  --  11  CREATININE 1.30 1.22 1.20  CALCIUM 9.7  --  9.1  AST 20  --   --   ALT 11  --   --   ALKPHOS 88  --   --   BILITOT 0.9  --   --    ------------------------------------------------------------------------------------------------------------------ estimated creatinine clearance is 60.7 mL/min (by C-G formula based on Cr of 1.2). ------------------------------------------------------------------------------------------------------------------ No results for input(s): HGBA1C in the last 72 hours. ------------------------------------------------------------------------------------------------------------------  Recent Labs  06/08/14 0451  CHOL 175  HDL 25*  LDLCALC 133*  TRIG 85  CHOLHDL 7.0   ------------------------------------------------------------------------------------------------------------------  Recent Labs  06/07/14 1745  TSH 0.912    ------------------------------------------------------------------------------------------------------------------  Recent Labs  06/07/14 1745  VITAMINB12 666  FOLATE 7.4    Coagulation profile  Recent Labs Lab 06/07/14 1436  INR 1.05    No results for input(s): DDIMER in the last 72 hours.  Cardiac Enzymes No results for input(s): CKMB, TROPONINI, MYOGLOBIN  in the last 168 hours.  Invalid input(s): CK ------------------------------------------------------------------------------------------------------------------ Invalid input(s): POCBNP     Time Spent in minutes   30 minutes   Jiselle Sheu M.D on 06/08/2014 at 1:26 PM  Between 7am to 7pm - Pager - [phone]  After 7pm go to [email or web address] - password [email or web address]  And look for the night coverage person covering for me after hours  [facility] Office  [phone]   **Disclaimer: This note may have been dictated with voice recognition software. Similar sounding words can inadvertently be transcribed and this note may contain transcription errors which may not have been corrected upon publication of note.**

## 2014-06-08 NOTE — Progress Notes (Signed)
Peripherally Inserted Central Catheter/Midline Placement  The IV Nurse has discussed with the patient and/or persons authorized to consent for the patient, the purpose of this procedure and the potential benefits and risks involved with this procedure.  The benefits include less needle sticks, lab draws from the catheter and patient may be discharged home with the catheter.  Risks include, but not limited to, infection, bleeding, blood clot (thrombus formation), and puncture of an artery; nerve damage and irregular heat beat.  Alternatives to this procedure were also discussed. Consent obtained from sister.  Pt also verbally agreed to procedure an dwas cooperative during procedure.  PICC/Midline Placement Documentation  PICC / Midline Single Lumen 06/08/14 PICC Right Basilic 38 cm 0 cm (Active)  Indication for Insertion or Continuance of Line Limited venous access - need for IV therapy >5 days (PICC only) 06/08/2014  6:06 PM  Exposed Catheter (cm) 0 cm 06/08/2014  6:06 PM  Site Assessment Clean;Dry;Intact 06/08/2014  6:06 PM  Line Status Flushed;Saline locked;Blood return noted 06/08/2014  6:06 PM  Dressing Type Transparent 06/08/2014  6:06 PM  Dressing Status Clean;Dry;Intact;Antimicrobial disc in place 06/08/2014  6:06 PM  Line Care Connections checked and tightened 06/08/2014  6:06 PM  Line Adjustment (NICU/IV Team Only) No 06/08/2014  6:06 PM  Dressing Intervention New dressing 06/08/2014  6:06 PM  Dressing Change Due 06/15/14 06/08/2014  6:06 PM       Elliot Dallyiggs, Cordon Gassett Wright 06/08/2014, 6:07 PM

## 2014-06-08 NOTE — Progress Notes (Signed)
Nurse notified MD that power PICC line has been placed, notified CT. CT will call for pt in about 1-2 hours for CT angio    Andrew AuVafiadis, Shamel Germond I 06/08/2014 6:23 PM

## 2014-06-08 NOTE — Progress Notes (Signed)
Nurse notified MD via text msg that  IV team unable to reach family member to otain consent for PICC.  will attempt again later.  Will continue to monitor pt closely   Andrew AuVafiadis, Ulani Degrasse I 06/08/2014 3:54 PM

## 2014-06-08 NOTE — Evaluation (Signed)
Physical Therapy Evaluation Patient Details Name: Ian [name] MRN: [id number] DOB: [birth year] Today's Date: 06/08/2014   History of Present Illness  Pt adm with AMS. 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 Past medical history of CVA, alcohol abuse, hypertension  Clinical Impression  Pt admitted with above diagnosis. Pt currently with functional limitations due to the deficits listed below (see PT Problem List).  Pt will benefit from skilled PT to increase their independence and safety with mobility to allow discharge to the venue listed below.       Follow Up Recommendations CIR (if not a candidate for CIR then would recommend SNF.)    Equipment Recommendations  Rolling walker with 5" wheels    Recommendations for Other Services       Precautions / Restrictions Precautions Precautions: Fall      Mobility  Bed Mobility Overal bed mobility: Needs Assistance Bed Mobility: Supine to Sit     Supine to sit: Min assist;HOB elevated     General bed mobility comments: Assist to bring trunk up and hips to EOB.  Transfers Overall transfer level: Needs assistance Equipment used: Rolling walker (2 wheeled) Transfers: Sit to/from Stand Sit to Stand: Mod assist         General transfer comment: Verbal cues for hand placement and assist to bring hips up. Pt was trying to use urinal which may have resulted in incr assist needed for sit to stand.  Ambulation/Gait Ambulation/Gait assistance: Min assist Ambulation Distance (Feet): 150 Feet Assistive device: Rolling walker (2 wheeled) Gait Pattern/deviations: Step-through pattern;Decreased step length - right;Decreased stance time - right;Trunk flexed;Decreased dorsiflexion - right   Gait velocity interpretation: Below normal speed for age/gender General Gait Details: Verbal cues to stand more  erect. Pt with rt knee in flexion during stance.  Stairs            Wheelchair Mobility    Modified Rankin (Stroke Patients Only)       Balance Overall balance assessment: Needs assistance Sitting-balance support: No upper extremity supported;Feet supported Sitting balance-Leahy Scale: Fair     Standing balance support: Bilateral upper extremity supported Standing balance-Leahy Scale: Poor Standing balance comment: support of walker and min A for static standing.                             Pertinent Vitals/Pain Pain Assessment: No/denies pain    Home Living Family/patient expects to be discharged to:: Private residence Living Arrangements: Alone           Home Layout: One level Home Equipment: Other (comment) (walking stick)      Prior Function Level of Independence: Independent with assistive device(s)         Comments: Pt reports he was amb with walking stick. Unsure of accuracy of information.     Hand Dominance        Extremity/Trunk Assessment   Upper Extremity Assessment: Defer to OT evaluation           Lower Extremity Assessment: Generalized weakness;RLE deficits/detail RLE Deficits / Details: grossly 3+/5       Communication   Communication: No difficulties  Cognition Arousal/Alertness: Awake/alert Behavior During Therapy: WFL for tasks assessed/performed Overall Cognitive Status: No family/caregiver present to determine baseline cognitive functioning Area of Impairment: Safety/judgement;Problem solving;Memory;Orientation Orientation Level: Situation;Time   Memory: Decreased short-term memory  Safety/Judgement: Decreased awareness of deficits;Decreased awareness of safety   Problem Solving: Slow processing;Requires verbal cues;Requires tactile cues;Decreased initiation      General Comments      Exercises        Assessment/Plan    PT Assessment Patient needs continued PT services  PT Diagnosis  Difficulty walking;Generalized weakness   PT Problem List Decreased strength;Decreased activity tolerance;Decreased balance;Decreased mobility;Decreased cognition;Decreased safety awareness;Decreased knowledge of use of DME;Decreased knowledge of precautions  PT Treatment Interventions DME instruction;Balance training;Gait training;Cognitive remediation;Functional mobility training;Patient/family education;Therapeutic activities;Therapeutic exercise   PT Goals (Current goals can be found in the Care Plan section) Acute Rehab PT Goals Patient Stated Goal: not stated PT Goal Formulation: With patient Time For Goal Achievement: 06/15/14 Potential to Achieve Goals: Good    Frequency Min 4X/week   Barriers to discharge Decreased caregiver support      Co-evaluation               End of Session Equipment Utilized During Treatment: Gait belt Activity Tolerance: Patient tolerated treatment well Patient left: in chair;with call bell/phone within reach;with chair alarm set Nurse Communication: Mobility status;Other (comment) (emptied urinal of 250)         Time: 1610-96041402-1423 PT Time Calculation (min) (ACUTE ONLY): 21 min   Charges:   PT Evaluation $Initial PT Evaluation Tier I: 1 Procedure     PT G Codes:        Archie Atilano 06/08/2014, 2:41 PM  [facility] Pietila PT [phone]

## 2014-06-08 NOTE — Progress Notes (Signed)
Attempted to gain consent from family member for PICC placement.  Phone number given by Andee PolesGail Miller,ex- caregiver, was for pt's sister and brother-in-law  [phone]  Dois DavenportSandra and Tobin ChadSam Roberts.  Unable to contact. Floor RN notified.

## 2014-06-09 DIAGNOSIS — I6350 Cerebral infarction due to unspecified occlusion or stenosis of unspecified cerebral artery: Secondary | ICD-10-CM

## 2014-06-09 DIAGNOSIS — R410 Disorientation, unspecified: Secondary | ICD-10-CM | POA: Insufficient documentation

## 2014-06-09 DIAGNOSIS — E785 Hyperlipidemia, unspecified: Secondary | ICD-10-CM | POA: Insufficient documentation

## 2014-06-09 LAB — CBC
HCT: 41.3 % (ref 39.0–52.0)
Hemoglobin: 13.2 g/dL (ref 13.0–17.0)
MCH: 22.6 pg — ABNORMAL LOW (ref 26.0–34.0)
MCHC: 32 g/dL (ref 30.0–36.0)
MCV: 70.6 fL — ABNORMAL LOW (ref 78.0–100.0)
Platelets: 286 10*3/uL (ref 150–400)
RBC: 5.85 MIL/uL — AB (ref 4.22–5.81)
RDW: 15.2 % (ref 11.5–15.5)
WBC: 6.1 10*3/uL (ref 4.0–10.5)

## 2014-06-09 LAB — BASIC METABOLIC PANEL
Anion gap: 7 (ref 5–15)
BUN: 10 mg/dL (ref 6–23)
CO2: 26 mmol/L (ref 19–32)
Calcium: 8.8 mg/dL (ref 8.4–10.5)
Chloride: 105 mmol/L (ref 96–112)
Creatinine, Ser: 0.99 mg/dL (ref 0.50–1.35)
GFR, EST NON AFRICAN AMERICAN: 80 mL/min — AB (ref >90)
GLUCOSE: 88 mg/dL (ref 70–99)
POTASSIUM: 3.7 mmol/L (ref 3.5–5.1)
SODIUM: 138 mmol/L (ref 135–145)

## 2014-06-09 NOTE — Evaluation (Signed)
Occupational Therapy Evaluation Patient Details Name: Ian [name] MRN: [id number] DOB: 12/27/1940 Today's Date: 06/09/2014    History of Present Illness Pt adm with AMS. 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 Past medical history of CVA, alcohol abuse, hypertension   Clinical Impression   PTA pt lived at home, however unsure of home setup. Pt reports he lives alone. Pt is A&Ox2 (person, place) and reports that he is independent at baseline with ADLs. Pt LTM appears intact, however demonstrates decreased STM and decreased cognition. Pt will benefit from CIR vs. SNF at d/c and will benefit from acute OT to address ADLs.     Follow Up Recommendations  CIR;Supervision/Assistance - 24 hour (pr SNF if inappropriate for CIR)    Equipment Recommendations  Other (comment) (TBD at next venue)    Recommendations for Other Services       Precautions / Restrictions Precautions Precautions: Fall Restrictions Weight Bearing Restrictions: No      Mobility Bed Mobility Overal bed mobility: Needs Assistance Bed Mobility: Supine to Sit     Supine to sit: Supervision     General bed mobility comments: Supervision for safety.   Transfers Overall transfer level: Needs assistance Equipment used: Rolling walker (2 wheeled) Transfers: Sit to/from Stand Sit to Stand: Min guard         General transfer comment: Min guard for safety. No physical assist needed.     Balance Overall balance assessment: Needs assistance Sitting-balance support: No upper extremity supported;Feet supported Sitting balance-Leahy Scale: Good     Standing balance support: Bilateral upper extremity supported;During functional activity Standing balance-Leahy Scale: Fair Standing balance comment: Pt able to static stand without UE support. Required UE support on RW for dynamic  balance                            ADL Overall ADL's : Needs assistance/impaired Eating/Feeding: Set up;Sitting   Grooming: Wash/dry hands;Wash/dry face;Set up;Sitting Grooming Details (indicate cue type and reason): pt able to follow commands with setup for grooming tasks, including applying lotion. Slow processing Upper Body Bathing: Minimal assitance;Sitting   Lower Body Bathing: Sit to/from stand;Moderate assistance   Upper Body Dressing : Minimal assistance;Sitting   Lower Body Dressing: Sit to/from stand;Moderate assistance   Toilet Transfer: Min guard;Ambulation;RW Toilet Transfer Details (indicate cue type and reason): bed> recliner short distance           General ADL Comments: Pt in bed with bed pad and linens saturated with urine. Pt advanced to EOB and OT assisted in changing gown and cleaning pt's posterior side. Pt demonstrated fair balance in standing and ability to ambulate short distance to recliner.      Vision                 Additional Comments: unsure of accuracy but pt denies blurriness or double vision          Pertinent Vitals/Pain Pain Assessment: No/denies pain     Hand Dominance Right   Extremity/Trunk Assessment Upper Extremity Assessment Upper Extremity Assessment: Overall WFL for tasks assessed   Lower Extremity Assessment Lower Extremity Assessment: Defer to PT evaluation   Cervical / Trunk Assessment Cervical / Trunk Assessment: Normal   Communication Communication Communication: No difficulties   Cognition Arousal/Alertness: Awake/alert Behavior During Therapy: WFL for tasks assessed/performed Overall Cognitive Status: No family/caregiver  present to determine baseline cognitive functioning Area of Impairment: Safety/judgement;Problem solving;Memory;Orientation Orientation Level: Time;Situation (able to state today is sunday and montbut did not know date )   Memory: Decreased short-term memory    Safety/Judgement: Decreased awareness of deficits;Decreased awareness of safety   Problem Solving: Slow processing;Requires verbal cues;Requires tactile cues;Decreased initiation                Home Living Family/patient expects to be discharged to:: Private residence Living Arrangements: Alone           Home Layout: One level               Home Equipment: Other (comment) (walking stick)      Lives With: Alone (per patient's report)    Prior Functioning/Environment Level of Independence: Independent with assistive device(s)        Comments: Pt reports he was amb with walking stick. Unsure of accuracy of information.    OT Diagnosis: Generalized weakness;Cognitive deficits;Altered mental status   OT Problem List: Decreased activity tolerance;Impaired balance (sitting and/or standing);Decreased cognition;Decreased safety awareness   OT Treatment/Interventions: Self-care/ADL training;Therapeutic exercise;Energy conservation;DME and/or AE instruction;Cognitive remediation/compensation;Therapeutic activities;Patient/family education;Balance training    OT Goals(Current goals can be found in the care plan section) Acute Rehab OT Goals Patient Stated Goal: to eat dinner OT Goal Formulation: With patient Time For Goal Achievement: 06/23/14 Potential to Achieve Goals: Good ADL Goals Pt Will Perform Grooming: with modified independence;standing Pt Will Perform Lower Body Bathing: with modified independence;sit to/from stand Pt Will Perform Lower Body Dressing: with modified independence;sit to/from stand Pt Will Transfer to Toilet: with modified independence;ambulating Pt Will Perform Toileting - Clothing Manipulation and hygiene: with modified independence;sit to/from stand  OT Frequency: Min 2X/week   Barriers to D/C: Decreased caregiver support             End of Session Equipment Utilized During Treatment: Gait belt;Rolling walker Nurse Communication:  Other (comment) (pt soiled and changed; in recliner)  Activity Tolerance: Patient tolerated treatment well Patient left: in chair;with call bell/phone within reach;with chair alarm set   Time: 1610-9604 OT Time Calculation (min): 23 min Charges:  OT General Charges $OT Visit: 1 Procedure OT Evaluation $Initial OT Evaluation Tier I: 1 Procedure OT Treatments $Self Care/Home Management : 8-22 mins G-Codes:    Rae Lips 07-05-14, 5:14 PM   Carney Living, OTR/L Occupational Therapist [phone] (pager)

## 2014-06-09 NOTE — Progress Notes (Signed)
Patient Demographics  Ian [name], is a 74 y.o. male, DOB - [birth year], ZOX:[id number]  Admit date - 06/07/2014   Admitting Physician Jeralyn Bennett, MD  Outpatient Primary MD for the patient is Sanda Linger, MD  LOS - 2   Chief Complaint  Patient presents with  . Altered Mental Status      Admission history of present illness/brief narrative: Ian [name] is a 74 y.o. male with a past medical history of CVA, alcohol abuse, hypertension, dyslipidemia, who was brought to the emergency department at [facility] by EMS for mental status changes.he went to a neighbors house reporting that he didn't feel well. He was found to be confused, soiled, and disheveled. Per medical records patient has history of alcoholism as patient reports his last drink was greater than one month ago. Workup in the emergency room included a CT scan of brain which showed subacute to chronic left centrum semiovale lacunar infarct along with small vessel ischemic changes. He was found to be hypertensive having systolic blood pressures in the 190s. Discussed with family who reported patient had history of alcohol abuse, but they think he did stop drinking over the last 2-3 months.   Subjective:   Ian [name] today has, No headache, No chest pain, No abdominal pain , but he is confused, poor historian.  Assessment & Plan    Principal Problem:   Acute encephalopathy Active Problems:   Coronary atherosclerosis   History of cardiovascular disorder   Tobacco abuse   Confusion   Malignant HTN with heart disease, w/o CHF, w/o chronic kidney disease   Alcohol abuse   CVA (cerebral vascular accident)  Altered mental status/encephalopathy:  - This is most likely related to acute CVA, but as well there is likely an other contributing factors, including dementia at  baseline, as evident on imaging, as well patient with known history of alcohol abuse, with possibility of Wernick encephalopathy as well. - Will start on IV thiamine, - TSH, B-12, folate within normal limit. - We'll check RPR, HIV  Acute CVA - MRI brain showing multiple acute punctate infarcts in the anterior and posterior circulation, with remote infarcts in the brainstem, bilateral cerebellum, and central left MCA territory. - MRA showing extensive intracranial atherosclerosis - Continue with dual antiplatelet therapy aspirin and Plavix for next 3 months, then continue with Plavix. 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  -2-D echo EF 65%, with grade 1 diastolic dysfunction. - Will need 3 days heart monitor as an outpatient.   Hypertension - Uncontrolled, will allow for permissive hypertension, and aim for gradual blood pressure control - Resumed on home medication   Questionable Pneumonia - Repeat chest x-ray doesn't show any evidence of opacity or infiltrate, will discontinue IV Rocephin and azithromycin. - Continue with Mucinex and incentive spirometry.  Dyslipidemia - Continue with statin  History of alcohol abuse -family and neighbor report patient stopped drinking a few weeks ago, unlikely to go into withdrawals, started on IV thiamine for possible Wernicke's.  Code Status: Full  Family Communication: spoke with family over the phone  [phone] on 1/23  Disposition Plan: CIR vs SNF, will request inpatient rehabilitation consult   Procedures  None   Consults  neurology   Medications  Scheduled Meds: .  stroke: mapping our early stages of recovery book   Does not apply Once  . amLODipine  5 mg Oral Daily  . aspirin EC  81 mg Oral Daily  . carvedilol  6.25 mg Oral BID WC  . clopidogrel  75 mg Oral  Daily  . heparin  5,000 Units Subcutaneous 3 times per day  . hydrochlorothiazide  25 mg Oral Daily  . lisinopril  20 mg Oral Daily  . simvastatin  20 mg Oral QHS  . [START ON 06/10/2014] sodium chloride 0.9 % 50 mL with thiamine (B-1) 250 mg infusion   Intravenous Daily  . sodium chloride  10-40 mL Intracatheter Q12H  . thiamine IV  500 mg Intravenous Daily   Continuous Infusions: . sodium chloride 75 mL/hr at 06/09/14 1110   PRN Meds:.acetaminophen **OR** acetaminophen, alum & mag hydroxide-simeth, labetalol, morphine injection, ondansetron **OR** ondansetron (ZOFRAN) IV, oxyCODONE, sodium chloride  DVT Prophylaxis  Heparin -   Lab Results  Component Value Date   PLT 286 06/09/2014    Antibiotics    Anti-infectives    Start     Dose/Rate Route Frequency Ordered Stop   06/07/14 1800  cefTRIAXone (ROCEPHIN) 1 g in dextrose 5 % 50 mL IVPB - Premix  Status:  Discontinued     1 g100 mL/hr over 30 Minutes Intravenous Every 24 hours 06/07/14 1724 06/08/14 1341   06/07/14 1800  azithromycin (ZITHROMAX) 500 mg in dextrose 5 % 250 mL IVPB  Status:  Discontinued     500 mg250 mL/hr over 60 Minutes Intravenous Every 24 hours 06/07/14 1725 06/08/14 1341   06/07/14 1715  cefTRIAXone (ROCEPHIN) 1 g in dextrose 5 % 50 mL IVPB  Status:  Discontinued     1 g100 mL/hr over 30 Minutes Intravenous Every 24 hours 06/07/14 1709 06/07/14 1724   06/07/14 1715  azithromycin (ZITHROMAX) 500 mg in dextrose 5 % 250 mL IVPB  Status:  Discontinued     500 mg250 mL/hr over 60 Minutes Intravenous Every 24 hours 06/07/14 1709 06/07/14 1724          Objective:   Filed Vitals:   06/08/14 2150 06/09/14 0055 06/09/14 0549 06/09/14 1030  BP: 171/82 179/79 153/54 157/66  Pulse: 56 49 48 45  Temp: 97.6 F (36.4 C) 97.7 F (36.5 C) 98 F (36.7 C) 97.8 F (36.6 C)  TempSrc: Oral Oral Oral Oral  Resp: 18 18 18 19   Height:      Weight:      SpO2: 100% 100% 100% 100%    Wt Readings from Last 3  Encounters:  06/07/14 86.728 kg (191 lb 3.2 oz)  09/17/10 88.905 kg (196 lb)  07/17/10 94.348 kg (208 lb)     Intake/Output Summary (Last 24 hours) at 06/09/14 1134 Last data filed at 06/09/14 0653  Gross per 24 hour  Intake    240 ml  Output   1000 ml  Net   -760 ml     Physical Exam  Awake Alert, confused, No new F.N deficits, Normal affect .AT,PERRAL Supple Neck,No JVD, No cervical lymphadenopathy appriciated.  Symmetrical Chest wall movement, Good air movement bilaterally, CTAB RRR,No Gallops,Rubs or new Murmurs, No Parasternal Heave +ve B.Sounds, Abd Soft, No tenderness, No organomegaly appriciated, No rebound - guarding or rigidity. No Cyanosis, Clubbing or edema,  No new Rash or bruise    Data Review   Micro Results No results found for this or any previous visit (from the past 240 hour(s)).  Radiology Reports X-ray Chest Pa And Lateral  06/08/2014   CLINICAL DATA:  Acute encephalopathy  EXAM: CHEST  2 VIEW  COMPARISON:  06/07/2014  FINDINGS: Cardiac shadow is stable. Postsurgical changes are again noted. The lungs are clear. No focal infiltrate or sizable effusion is seen. No bony abnormality is noted.  IMPRESSION: No acute abnormality noted.   Electronically Signed   By: Alcide Clever M.D.   On: 06/08/2014 09:27   Dg Chest 2 View  06/07/2014   CLINICAL DATA:  Altered mental status, failure to thrive  EXAM: CHEST  2 VIEW  COMPARISON:  None.  FINDINGS: The cardiac shadow is within normal limits. Postsurgical changes are noted. The lungs are well aerated bilaterally without focal infiltrate or sizable effusion. A few scattered calcifications are noted likely related to prior granulomatous disease. No acute bony abnormality is seen.  IMPRESSION: No acute abnormality noted.   Electronically Signed   By: Alcide Clever M.D.   On: 06/07/2014 15:11   Ct Head Wo Contrast  06/07/2014   CLINICAL DATA:  74 year old male with failure to thrive. Confusion and altered mentation  EXAM:  CT HEAD WITHOUT CONTRAST  TECHNIQUE: Contiguous axial images were obtained from the base of the skull through the vertex without intravenous contrast.  COMPARISON:  None.  FINDINGS: Low attenuation within the subcortical and periventricular white matter is identified compatible with chronic small vessel ischemic disease. Focal area of low attenuation within the left centrum semiovale is identified consistent with subacute to chronic lacunar infarct, image number 19/series 201. Prominence of the sulci and ventricles are identified consistent with brain atrophy. No evidence for acute brain infarct, hemorrhage or mass. The paranasal sinuses are clear. The mastoid air cells are also clear.  IMPRESSION: 1. Small vessel ischemic disease and brain atrophy. 2. 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   Electronically Signed   By: Signa Kell M.D.   On: 06/07/2014 15:02   Ct Angio Neck W/cm &/or Wo/cm  06/09/2014   CLINICAL DATA:  Initial evaluation for stroke.  EXAM: CT ANGIOGRAPHY NECK  TECHNIQUE: Multidetector CT imaging of the neck was performed using the standard protocol during bolus administration of intravenous contrast. Multiplanar CT image reconstructions and MIPs were obtained to evaluate the vascular anatomy. Carotid stenosis measurements (when applicable) are obtained utilizing NASCET criteria, using the distal internal carotid diameter as the denominator.  CONTRAST:  50 cc of OMNIPAQUE IOHEXOL 350 MG/ML SOLN  COMPARISON:  Prior MRI from 06/07/2014.  FINDINGS: Aortic arch: Visualize aortic arch is of normal caliber with normal 3 vessel morphology. Moderate atheromatous plaque present within the aortic arch itself. There is noncalcified atheromatous plaque at the origin of the great vessels without associated hemodynamically significant stenosis. Subclavian arteries widely patent bilaterally. Incidental note made of a right-sided central venous catheter with tip terminating in the distal  SVC.  Right carotid system: The right common carotid artery is well opacified to the level of the carotid bifurcation. Calcified and noncalcified plaque at the carotid bifurcation/proximal right internal carotid artery present with resultant stenosis of approximately 40% by NASCET criteria. Distally, the right internal carotid artery is tortuous but well opacified to the level of the skullbase. Prominent atheromatous plaque present within the visualized cavernous right ICA.  Right external carotid arteries and its branch vessels grossly normal.  Left carotid system: Left common carotid artery well opacified to the level of the carotid bifurcation. Minimal atheromatous plaque present about the left carotid bifurcation and proximal left internal carotid artery without significant stenosis. Left ICA is mildly tortuous with medialization into the retropharyngeal space. No hemodynamically significant stenosis, dissection, or vascular occlusion identified within left ICA.  Left external carotid artery and its branches within normal limits.  Vertebral arteries:Both vertebral arteries appear occluded from their origin from the subclavian arteries. There is some distal reconstitution via muscular branches at the level of C2-3 (series 401, image 90). The reconstituted vertebral arteries are markedly small and attenuated in appearance. Moderate to high-grade stenosis of the left V4 segment proximal to the left posterior inferior cerebral artery again noted. Distally, the left vertebral artery is diminutive with poor flow.  Visualized lungs are clear. No soft tissue abnormality within the neck. Thyroid gland is normal. No adenopathy.  Advanced multilevel degenerative disc disease noted within the visualized cervical spine. No acute osseus abnormality. No worrisome lytic or blastic osseous lesions.  IMPRESSION: 1. 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 There is some distal reconstitution  of the V2 segments at the level of C2-3 via muscular branches. Distally, the vertebral arteries are diminutive in appearance. 2. Moderate to high-grade stenosis within the distal left V4 segment, similar as described on previous MRA. 3. Calcified plaque at the right carotid bifurcation/proximal right ICA with associated short segment stenosis of approximately 40% by NASCET criteria. 4. No other hemodynamically significant stenosis identified within the neck.   Electronically Signed   By: Rise MuBenjamin  McClintock M.D.   On: 06/09/2014 00:32   Mr Brain Wo Contrast  06/07/2014   CLINICAL DATA:  Acute encephalopathy.  EXAM: MRI HEAD WITHOUT CONTRAST  MRA HEAD WITHOUT CONTRAST  TECHNIQUE: Multiplanar, multiecho pulse sequences of the brain and surrounding structures were obtained without intravenous contrast. Angiographic images of the head were obtained using MRA technique without contrast.  COMPARISON:  Head CT from earlier the same day  FINDINGS: MRI HEAD FINDINGS  Calvarium and upper cervical spine: No focal marrow signal abnormality.  The posterior ring of C1 is hypoplastic, resulting in deformity of the posterior spinal cord, similar to increased from 2008. Degenerative endplate ridging C3-4 also deforms the ventral cord at this level.  Orbits: No significant findings.  Sinuses: Clear. Mastoid and middle ears are clear.  Brain: Numerous punctate acute infarcts in the infra and supratentorial brain. Infratentorially, the infarcts are distributed throughout the bilateral cerebellum, up to 9 mm in diameter on the right. There could be a punctate infarct in the left upper pons, but small size limits certainty. The cerebral infarcts are clustered in the occipital and parietal cortex, although small infarcts are present in the bilateral frontal lobes. No hemorrhagic conversion. Arterial findings discussed below.  There is extensive remote ischemic injury. In the posterior fossa, there have been remote infarcts of the  bilateral pons and cerebellar hemispheres. There has been a large central MCA territory infarct on the left with loss of the insula and opercular cortex. An associated lacune in the left corona radiata is also a remote. Ischemic gliosis is confluent throughout the bilateral cerebral white matter.  There 2 small foci of remote hemorrhage in the right parietal cortex.  No hydrocephalus, mass lesion, or shift.  MRA HEAD FINDINGS  Posterior circulation: The vertebral arteries are balanced and small. There is a moderate to high-grade stenosis of the left V4 segment proximal  to the patent PICA. Afterward, the left vertebral artery is diminutive with poor flow. The majority of the basilar flow comes from the right vertebral artery which has a hypoplastic PICA and dominant AICA. The basilar artery is hypoplastic in the setting of large posterior communicating arteries, likely exacerbated by inflow disease. On conventional imaging there is no signal change to suggest superimposed dissection. There is a high-grade stenosis of the left P1 segment, well compensated by large posterior communicating artery. Moderate multi focal atherosclerotic narrowing of the bilateral PCAs.  Anterior circulation: Notable anatomic variants include large posterior communicating arteries, hypoplastic right A1, and duplicated anterior communicating arteries.  The right cavernous carotid has atherosclerosis along its entirety, with at least moderate stenosis at its mid portion. The upper division of the right MCA has a high-grade short-segment stenosis just beyond the bifurcation.  There is atherosclerotic irregularity of the left cavernous carotid without notable narrowing. The left A1 and MCA branches are widely patent.  There is no evidence of aneurysm (when accounting for duplicated anterior communicating artery).  Unusual prominence of right mesencephalic and prepontine vein with time-of-flight signal. Despite this, there is no distention  typical of an arterial venous malformation or fistula.  IMPRESSION: 1. Multiple acute punctate infarcts in the anterior and posterior circulation. This pattern suggests a central embolic process. 2. Remote infarcts of the brainstem, bilateral cerebellum, and central left MCA territory. 3. Extensive intracranial atherosclerosis with advanced stenoses of the left V4, upper division right MCA branch, right cavernous carotid, and left P1. 4. 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   Electronically Signed   By: Tiburcio Pea M.D.   On: 06/07/2014 20:58   Mr Maxine Glenn Head/brain Wo Cm  06/07/2014   CLINICAL DATA:  Acute encephalopathy.  EXAM: MRI HEAD WITHOUT CONTRAST  MRA HEAD WITHOUT CONTRAST  TECHNIQUE: Multiplanar, multiecho pulse sequences of the brain and surrounding structures were obtained without intravenous contrast. Angiographic images of the head were obtained using MRA technique without contrast.  COMPARISON:  Head CT from earlier the same day  FINDINGS: MRI HEAD FINDINGS  Calvarium and upper cervical spine: No focal marrow signal abnormality.  The posterior ring of C1 is hypoplastic, resulting in deformity of the posterior spinal cord, similar to increased from 2008. Degenerative endplate ridging C3-4 also deforms the ventral cord at this level.  Orbits: No significant findings.  Sinuses: Clear. Mastoid and middle ears are clear.  Brain: Numerous punctate acute infarcts in the infra and supratentorial brain. Infratentorially, the infarcts are distributed throughout the bilateral cerebellum, up to 9 mm in diameter on the right. There could be a punctate infarct in the left upper pons, but small size limits certainty. The cerebral infarcts are clustered in the occipital and parietal cortex, although small infarcts are present in the bilateral frontal lobes. No hemorrhagic conversion. Arterial findings discussed below.  There is extensive remote ischemic injury. In the posterior  fossa, there have been remote infarcts of the bilateral pons and cerebellar hemispheres. There has been a large central MCA territory infarct on the left with loss of the insula and opercular cortex. An associated lacune in the left corona radiata is also a remote. Ischemic gliosis is confluent throughout the bilateral cerebral white matter.  There 2 small foci of remote hemorrhage in the right parietal cortex.  No hydrocephalus, mass lesion, or shift.  MRA HEAD FINDINGS  Posterior circulation: The vertebral arteries are balanced and small. There is a moderate to high-grade stenosis of  the left V4 segment proximal to the patent PICA. Afterward, the left vertebral artery is diminutive with poor flow. The majority of the basilar flow comes from the right vertebral artery which has a hypoplastic PICA and dominant AICA. The basilar artery is hypoplastic in the setting of large posterior communicating arteries, likely exacerbated by inflow disease. On conventional imaging there is no signal change to suggest superimposed dissection. There is a high-grade stenosis of the left P1 segment, well compensated by large posterior communicating artery. Moderate multi focal atherosclerotic narrowing of the bilateral PCAs.  Anterior circulation: Notable anatomic variants include large posterior communicating arteries, hypoplastic right A1, and duplicated anterior communicating arteries.  The right cavernous carotid has atherosclerosis along its entirety, with at least moderate stenosis at its mid portion. The upper division of the right MCA has a high-grade short-segment stenosis just beyond the bifurcation.  There is atherosclerotic irregularity of the left cavernous carotid without notable narrowing. The left A1 and MCA branches are widely patent.  There is no evidence of aneurysm (when accounting for duplicated anterior communicating artery).  Unusual prominence of right mesencephalic and prepontine vein with time-of-flight  signal. Despite this, there is no distention typical of an arterial venous malformation or fistula.  IMPRESSION: 1. Multiple acute punctate infarcts in the anterior and posterior circulation. This pattern suggests a central embolic process. 2. Remote infarcts of the brainstem, bilateral cerebellum, and central left MCA territory. 3. Extensive intracranial atherosclerosis with advanced stenoses of the left V4, upper division right MCA branch, right cavernous carotid, and left P1. 4. 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   Electronically Signed   By: Tiburcio Pea M.D.   On: 06/07/2014 20:58    CBC  Recent Labs Lab 06/07/14 1436 06/07/14 1745 06/08/14 0451 06/09/14 0425  WBC 4.5 6.1 5.9 6.1  HGB 15.5 14.9 14.5 13.2  HCT 47.6 45.1 45.0 41.3  PLT 290 284 280 286  MCV 71.0* 70.1* 71.4* 70.6*  MCH 23.1* 23.2* 23.0* 22.6*  MCHC 32.6 33.0 32.2 32.0  RDW 14.9 15.0 15.4 15.2  LYMPHSABS 1.1  --   --   --   MONOABS 0.8  --   --   --   EOSABS 0.0  --   --   --   BASOSABS 0.0  --   --   --     Chemistries   Recent Labs Lab 06/07/14 1436 06/07/14 1745 06/08/14 0451 06/09/14 0425  NA 140  --  141 138  K 3.8  --  4.0 3.7  CL 107  --  108 105  CO2 23  --  19 26  GLUCOSE 126*  --  91 88  BUN 11  --  11 10  CREATININE 1.30 1.22 1.20 0.99  CALCIUM 9.7  --  9.1 8.8  AST 20  --   --   --   ALT 11  --   --   --   ALKPHOS 88  --   --   --   BILITOT 0.9  --   --   --    ------------------------------------------------------------------------------------------------------------------ estimated creatinine clearance is 73.6 mL/min (by C-G formula based on Cr of 0.99). ------------------------------------------------------------------------------------------------------------------  Recent Labs  06/08/14 0451  HGBA1C 5.7*   ------------------------------------------------------------------------------------------------------------------  Recent Labs   06/08/14 0451  CHOL 175  HDL 25*  LDLCALC 133*  TRIG 85  CHOLHDL 7.0   ------------------------------------------------------------------------------------------------------------------  Recent Labs  06/07/14 1745  TSH 0.912   ------------------------------------------------------------------------------------------------------------------  Recent Labs  06/07/14 1745  VITAMINB12 666  FOLATE 7.4    Coagulation profile  Recent Labs Lab 06/07/14 1436  INR 1.05    No results for input(s): DDIMER in the last 72 hours.  Cardiac Enzymes No results for input(s): CKMB, TROPONINI, MYOGLOBIN in the last 168 hours.  Invalid input(s): CK ------------------------------------------------------------------------------------------------------------------ Invalid input(s): POCBNP     Time Spent in minutes   30 minutes   ELGERGAWY, DAWOOD M.D on 06/09/2014 at 11:34 AM  Between 7am to 7pm - Pager - [phone]  After 7pm go to [email or web address] - password [email or web address]  And look for the night coverage person covering for me after hours  [facility] Office  [phone]   **Disclaimer: This note may have been dictated with voice recognition software. Similar sounding words can inadvertently be transcribed and this note may contain transcription errors which may not have been corrected upon publication of note.**

## 2014-06-09 NOTE — Evaluation (Signed)
Speech Language Pathology Evaluation Patient Details Name: Ian FlockDavid [name] MRN: [id number] DOB: 11/21/1940 Today's Date: 06/09/2014 Time: [id number] SLP Time Calculation (min) (ACUTE ONLY): 35 min  Problem List:  Patient Active Problem List   Diagnosis Date Noted  . CVA (cerebral vascular accident) 06/08/2014  . Confusion 06/07/2014  . Acute encephalopathy 06/07/2014  . Hypertension 06/07/2014  . 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 Alcohol abuse 06/07/2014  . Tobacco abuse 09/17/2010  . CAD (coronary artery disease)   . HYPERLIPIDEMIA 07/17/2010  . HYPERTENSION 07/17/2010  . Coronary atherosclerosis 07/17/2010  . BENIGN PROSTATIC HYPERTROPHY 07/17/2010  . History of cardiovascular disorder 07/17/2010   Past Medical History:  Past Medical History  Diagnosis Date  . CAD (coronary artery disease)     LIMA to OM2, SVG to OM1, SVG to RCA  . HLD (hyperlipidemia)   . HTN (hypertension)   . BPH (benign prostatic hyperplasia)   . Stroke    Past Surgical History:  Past Surgical History  Procedure Laterality Date  . Coronary artery bypass graft  2001  . Hernia repair     HPI:  74 y.o. male who is a poor historian and can not tell me why he is here this evening.  It seems that he went to a neighbor's house reporting that he didn't feel well. His neighbor noted patient not acting himself and that he has had a progressive decline over the past several weeks. His neighbor called EMS. He was found to be confused, soiled, and disheveled.  [facility] for further evaluation.     Assessment / Plan / Recommendation Clinical Impression  Patient presents with a moderate cognitive-linguistic impairment, characterized by decreased delayed recall, delays in processing speed, mild perseveration on word when trying to respond to open-ended questions, disorientation to time, situation, with an overall flat affect. Per nursing, patient has  been impulsive with eating and has tried to get up without asking for help. No family present to determine cognitive baseline, but suspect that patient was exhibiting some deficits prior to this admission.    SLP Assessment  Patient needs continued Speech Lanaguage Pathology Services    Follow Up Recommendations  Skilled Nursing facility;24 hour supervision/assistance    Frequency and Duration min 2x/week  2 weeks   Pertinent Vitals/Pain Pain Assessment: No/denies pain   SLP Goals  Patient/Family Stated Goal: none stated Potential to Achieve Goals (ACUTE ONLY): Fair (depends on his cognitive baseline) Potential Considerations (ACUTE ONLY): Ability to learn/carryover information;Previous level of function;Family/community support  SLP Evaluation Prior Functioning  Cognitive/Linguistic Baseline: Information not available  Lives With: Alone (per patient's report) Vocation: Retired (patient stated he worked for [employer]   Cognition  Overall Cognitive Status: No family/caregiver present to determine baseline cognitive functioning Arousal/Alertness: Awake/alert Orientation Level: Oriented to place;Oriented to person;Disoriented to time;Disoriented to situation (could not state birthdate, but correct with age: "[age]") Attention: Sustained Sustained Attention: Impaired Sustained Attention Impairment: Verbal basic;Verbal complex Memory: Impaired Memory Impairment: Storage deficit;Retrieval deficit;Decreased recall of new information;Decreased short term memory Decreased Short Term Memory: Verbal basic Awareness: Impaired Awareness Impairment: Intellectual impairment Problem Solving: Impaired Problem Solving Impairment: Verbal basic Executive Function: Decision Making;Reasoning Reasoning: Impaired Reasoning Impairment: Verbal basic;Functional basic Decision Making: Impaired Decision Making Impairment: Functional basic Behaviors: Impulsive;Perseveration Safety/Judgment:  Impaired    Comprehension  Auditory Comprehension Overall Auditory Comprehension: Impaired Yes/No Questions: Within Functional Limits Commands: Within Functional Limits Conversation: Simple Interfering Components: Attention;Processing speed;Working  memory EffectiveTechniques: Extra processing time;Repetition Reading Comprehension Reading Status: Not tested    Expression Expression Primary Mode of Expression: Verbal Verbal Expression Overall Verbal Expression: Impaired Initiation: No impairment Level of Generative/Spontaneous Verbalization: Phrase Repetition: No impairment Pragmatics: Impairment Impairments: Monotone;Abnormal affect (flat affect) Interfering Components: Attention Effective Techniques: Semantic cues;Open ended questions Written Expression Written Expression: Not tested   Oral / Motor Oral Motor/Sensory Function Overall Oral Motor/Sensory Function: Impaired Labial ROM: Reduced left Labial Strength: Reduced Lingual Symmetry: Within Functional Limits Lingual Strength: Within Functional Limits Facial ROM: Reduced left Facial Strength: Reduced Motor Speech Overall Motor Speech: Appears within functional limits for tasks assessed   GO     Pablo Lawrence 06/09/2014, 2:51 PM   Angela Nevin, MA, CCC-SLP 06/09/2014 2:51 PM

## 2014-06-09 NOTE — Progress Notes (Signed)
STROKE TEAM PROGRESS NOTE   HISTORY OF PRESENT ILLNESS Ian [name] is an 74 y.o. male who is a poor historian and can not tell me why he is here this evening. It seems that he went to a neighbor's house reporting that he didn't feel well. His neighbor noted patient not acting himself and that he has had a progressive decline over the past several weeks. His neighbor called EMS. He was found to be confused, soiled, and disheveled. [facility].  Per chart has a history of CVA, alcohol abuse, hypertension, dyslipidemia.  Date last known well: Unable to determine Time last known well: Unable to determine tPA Given: No: Unable to determine LKW  SUBJECTIVE (INTERVAL HISTORY) No family members present. The patient remains disorientated with cognitive impairment. Dementia versus Wernike's encephalopathy. CTA neck confirmed poor posterior circulation.    OBJECTIVE Temp:  [97.5 F (36.4 C)-98.2 F (36.8 C)] 97.8 F (36.6 C) (01/24 1030) Pulse Rate:  [45-56] 45 (01/24 1030) Cardiac Rhythm:  [-] Sinus bradycardia (01/24 0800) Resp:  [18-19] 19 (01/24 1030) BP: (153-190)/(54-82) 157/66 mmHg (01/24 1030) SpO2:  [100 %] 100 % (01/24 1030)  No results for input(s): GLUCAP in the last 168 hours.  Recent Labs Lab 06/07/14 1436 06/07/14 1745 06/08/14 0451 06/09/14 0425  NA 140  --  141 138  K 3.8  --  4.0 3.7  CL 107  --  108 105  CO2 23  --  19 26  GLUCOSE 126*  --  91 88  BUN 11  --  11 10  CREATININE 1.30 1.22 1.20 0.99  CALCIUM 9.7  --  9.1 8.8    Recent Labs Lab 06/07/14 1436  AST 20  ALT 11  ALKPHOS 88  BILITOT 0.9  PROT 7.5  ALBUMIN 3.9    Recent Labs Lab 06/07/14 1436 06/07/14 1745 06/08/14 0451 06/09/14 0425  WBC 4.5 6.1 5.9 6.1  NEUTROABS 2.6  --   --   --   HGB 15.5 14.9 14.5 13.2  HCT 47.6 45.1 45.0 41.3  MCV 71.0* 70.1* 71.4* 70.6*  PLT 290 284 280 286   No results for input(s): CKTOTAL, CKMB, CKMBINDEX,  TROPONINI in the last 168 hours.  Recent Labs  06/07/14 1436  LABPROT 13.8  INR 1.05    Recent Labs  06/07/14 1554  COLORURINE ORANGE*  LABSPEC 1.026  PHURINE 5.5  GLUCOSEU NEGATIVE  HGBUR NEGATIVE  BILIRUBINUR MODERATE*  KETONESUR 15*  PROTEINUR NEGATIVE  UROBILINOGEN 1.0  NITRITE NEGATIVE  LEUKOCYTESUR SMALL*       Component Value Date/Time   CHOL 175 06/08/2014 0451   TRIG 85 06/08/2014 0451   HDL 25* 06/08/2014 0451   CHOLHDL 7.0 06/08/2014 0451   VLDL 17 06/08/2014 0451   LDLCALC 133* 06/08/2014 0451   Lab Results  Component Value Date   HGBA1C 5.7* 06/08/2014      Component Value Date/Time   LABOPIA NONE DETECTED 06/07/2014 1554   COCAINSCRNUR NONE DETECTED 06/07/2014 1554   LABBENZ NONE DETECTED 06/07/2014 1554   AMPHETMU NONE DETECTED 06/07/2014 1554   THCU NONE DETECTED 06/07/2014 1554   LABBARB NONE DETECTED 06/07/2014 1554    No results for input(s): ETH in the last 168 hours.  I have personally reviewed the radiological images below and agree with the radiology interpretations.  X-ray Chest Pa And Lateral  06/08/2014   IMPRESSION: No acute abnormality noted.     Dg Chest 2 View  06/07/2014  IMPRESSION: No acute abnormality noted.     Ct Head Wo Contrast  06/07/2014   IMPRESSION: 1. Small vessel ischemic disease and brain atrophy. 2. 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      Mri and Mra Brain Wo Contrast  06/07/2014    IMPRESSION: 1. Multiple acute punctate infarcts in the anterior and posterior circulation. This pattern suggests a central embolic process. 2. Remote infarcts of the brainstem, bilateral cerebellum, and central left MCA territory. 3. Extensive intracranial atherosclerosis with advanced stenoses of the left V4, upper division right MCA branch, right cavernous carotid, and left P1. 4. 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      CTA Neck  06/08/2014 1. 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 There is some distal reconstitution of the V2 segments at the level of C2-3 via muscular branches. Distally, the vertebral arteries are diminutive in appearance. 2. Moderate to high-grade stenosis within the distal left V4 segment, similar as described on previous MRA. 3. Calcified plaque at the right carotid bifurcation/proximal right ICA with associated short segment stenosis of approximately 40% by NASCET criteria. 4. No other hemodynamically significant stenosis identified within the neck.  2D Echocardiogram  - Left ventricle: The cavity size was normal. There was moderate concentric hypertrophy. Systolic function was normal. The estimated ejection fraction was in the range of 60% to 65%. Wall motion was normal; there were no regional wall motion abnormalities. Doppler parameters are consistent with abnormal left ventricular relaxation (grade 1 diastolic dysfunction). The E/e&' ratio is between 8-15, suggesting indeterminate LV filling pressure. - Aortic valve: Mildly calcified leaflets. There was no stenosis. There was trace to mild regurgitation. - Mitral valve: Calcified annulus. There was trivial regurgitation. - Left atrium: The atrium was normal in size. - Right atrium: The atrium was mildly dilated. - Tricuspid valve: There was mild regurgitation. - Pulmonary arteries: PA peak pressure: 31 mm Hg (S). - Inferior vena cava: The vessel was normal in size. The respirophasic diameter changes were in the normal range (>= 50%), consistent with normal central venous pressure.  Impressions: - LVEF 60-65%, normal wall motion, moderate LVH, mild RAE, trace to mild AI with calcified aortic valve but no stenosis, mild TR, RVSP 31 mmHg, diastolic dysfunction.  EKG  normal sinus rhythm. For complete results please see formal report.  PHYSICAL EXAM  Temp:  [97.5 F (36.4 C)-98.2 F (36.8 C)] 97.8 F  (36.6 C) (01/24 1030) Pulse Rate:  [45-56] 45 (01/24 1030) Resp:  [18-19] 19 (01/24 1030) BP: (153-190)/(54-82) 157/66 mmHg (01/24 1030) SpO2:  [100 %] 100 % (01/24 1030)  General - Well nourished, well developed, in no apparent distress.  Ophthalmologic - not cooperative on exam.  Cardiovascular - Regular rate and rhythm with no murmur.  Neck - supple, no carotid bruits  Mental Status -  Awake alert but not orientated to year, place and people. He orientated to self, month and age. Marland Kitchen Paucity of language, short answers for questions. Comprehension grossly intact, naming impaired but repetition intact. Recent and remote memory were 3/3 registration and 0/3 delayed recall. Fund of Knowledge was assessed and was impaired.  Cranial Nerves II - XII - II - Visual field intact OU. III, IV, VI - Extraocular movements intact. V - Facial sensation intact bilaterally. VII - Facial movement intact bilaterally. VIII - Hearing & vestibular intact bilaterally. X - Palate elevates symmetrically. XI - Chin turning & shoulder shrug intact  bilaterally. XII - Tongue protrusion intact.  Motor Strength - The patient's strength was 5/5 UEs and 4/5 LEs and pronator drift was absent.  Bulk was normal and fasciculations were absent.   Motor Tone - Muscle tone was assessed at the neck and appendages and was normal.  Reflexes - The patient's reflexes were symmetrical in all extremities and he had no pathological reflexes.  Sensory - Light touch, temperature/pinprick were assessed and were symmetrical.    Coordination - The patient had normal movements in the hands with no ataxia or dysmetria.  Tremor was absent.  Gait and Station - not tested due to safety concerns.   ASSESSMENT/PLAN Mr. Zacarias Krauter is a 74 y.o. male with history of HTN and HLD, CAD s/p CABG, CVA in the past, alcohol abuse admitted for general condition decline for several weeks with cognitive impairment. Symptoms unchanged.     Stroke:  Bilateral punctate PCA territory and cerebellar infarcts, concerning for cardioembolic. However due to severe posterior vessel stenosis, large vessel athero still in ddx. However, this changes not able to explain pt dementia or encephalopathy. Less likely for advanced malignancy as he had stroke in 2003.   MRI  Bilateral punctate PCA territory and cerebellar infarcts  MRA  Multiple intracranial large vessel high grade stenosis  CTA neck occluded bilateral VA and high grade stenosis of BA.  2D Echo  unremarkable  LDL 133, not at goal  HgbA1c 5.7  Heparin subq for VTE prophylaxis  Not good candidate for TEE or loop, please consider 30 day outpt cardiac event monitoring  Due to poor posterior circulation, his BP control should not be too tight. BP goal 130-150.   Diet Heart   no antithrombotic prior to admission, now on aspirin 81 mg orally every day and clopidogrel 75 mg orally every day for 3 months and then plavix alone.  Patient counseled to be compliant with his antithrombotic medications  Ongoing aggressive stroke risk factor management  Therapy recommendations:  SNF vs. CIR  Disposition:  Pending  Hx of stroke  On MRI in 2003, it was found that he has punctate stroke in the left MCA territory  Also found to have left MCA territory encephalomalacia, concerning for left MCA stroke but traumatic brain injury not able to rule out given hx of heavy alcohol  This time MRI showed Remote infarcts of the brainstem, bilateral cerebellum, consistent with his posterior circulation high grade stenosis.  Stroke etiology - cardioembolic vs. athero  Recommend continue dual antiplatelet but consider 30 day outpt cardiac event monitoring  Continue tele to evaluate afib.  Dementia vs. Wernicke's Encephalopathy  May has baseline dementia as MRI showed confluent white matter T2 changes  Pt has hx of heavy drinking, wernicke's encephalopathy is in ddx  Thiamine  supplement of wernicke's treatment.   Hypertension  Home meds:   novasc and amlodipine and HCTZ, but not compliant Permissive hypertension (OK if <220/120) for 24-48 hours post stroke and then gradually normalized within 5-7 days. Currently on norvasc and lisinopril and HCTZ  Unstable, still on the high end  Due to poor posterior circulation, his BP control should not be too tight. BP goal 130-150.   Patient counseled to be compliant with his blood pressure medications  Hyperlipidemia  Home meds:  zocor 40, but pt not compliant  Currently resume zocor 40  LDL 133, goal < 70  Continue statin at discharge  Other Stroke Risk Factors  Advanced age  ETOH use   Hx stroke/TIA  Coronary artery disease  Hospital day # 2  Delton See PA-C [facility] Pager [phone] 06/09/2014, 12:52 PM  I, the attending vascular neurologist, have personally obtained a history, examined the patient, evaluated laboratory data, individually viewed imaging studies and agree with radiology interpretations. I also discussed with Dr. Randol Kern regarding his care plan. Together with the NP/PA, we formulated the assessment and plan of care which reflects our mutual decision.  I have made any additions or clarifications directly to the above note and agree with the findings and plan as currently documented.   74 yo M with hx of HTN and HLD, CAD s/p CABG, CVA in the past, alcohol abuse admitted for general condition decline for several weeks with cognitive impairment. MRI showed bilateral cerebellar and MCA/PCA watershed punctate infarct, concerning for cardioembolic stroke. However, his posterior circulation is very poor which was confirmed on CTA. Will recommend dural antiplatelet and also 30 day cardiac monitoring. Continue statin.  However, the punctate infarcts can not explain the dementia or encephalopathy. Due to history of heavy alcohol, wernicke's encephalopathy can not ruled out.  Currently on B1 treatment.   Neurology will sign off. Please call with questions. Pt will follow up with Dr. Roda Shutters at [facility] in about 2 months. Thanks for the consult.  Marvel Plan, MD PhD Stroke Neurology 06/09/2014 6:06 PM       To contact Stroke Continuity provider, please refer to [email or web address]. After hours, contact General Neurology

## 2014-06-10 DIAGNOSIS — I6340 Cerebral infarction due to embolism of unspecified cerebral artery: Secondary | ICD-10-CM

## 2014-06-10 LAB — HIV ANTIBODY (ROUTINE TESTING W REFLEX)
HIV 1/O/2 Abs-Index Value: <1 (ref <1.00)
HIV-1/HIV-2 Ab: NONREACTIVE

## 2014-06-10 LAB — RPR: RPR Ser Ql: NONREACTIVE

## 2014-06-10 NOTE — Progress Notes (Signed)
Received prescreen request for inpatient rehab and rehab consult has already been ordered. We will await completion of rehab consult and an admission coordinator will follow up. Thanks.  Juliann MuleJanine Destynie Toomey, PT Rehabilitation Admissions Coordinator [phone]

## 2014-06-10 NOTE — Progress Notes (Signed)
Physical Therapy Treatment Patient Details Name: Ian FlockDavid [name] MRN: [id number] DOB: 01/29/1941 Today's Date: 06/10/2014    History of Present Illness Pt adm with AMS. 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 Past medical history of CVA, alcohol abuse, hypertension    PT Comments    Patient is making gains with mobility.  Continues to have significant balance deficit, requiring mod assist at times to prevent falls during gait with RW.  Agree with need for Inpatient Rehab stay prior to discharge to maximize functional independence.   Follow Up Recommendations  CIR     Equipment Recommendations  Rolling walker with 5" wheels    Recommendations for Other Services       Precautions / Restrictions Precautions Precautions: Fall Restrictions Weight Bearing Restrictions: No    Mobility  Bed Mobility Overal bed mobility: Needs Assistance Bed Mobility: Supine to Sit     Supine to sit: Min assist;HOB elevated     General bed mobility comments: Verbal cuing for technique.  Difficulty using UE's to push trunk to sitting, requiring assist.  Once upright, patient with fair balance with flexed posture.  Increased time to scoot to EOB.  Transfers Overall transfer level: Needs assistance Equipment used: Rolling walker (2 wheeled) Transfers: Sit to/from Stand Sit to Stand: Min assist         General transfer comment: Verbal cues for hand placement - attempting to reach for RW.  Assist to rise to standing and for balance once upright.  Patient leaning posteriorly and to right.  Ambulation/Gait Ambulation/Gait assistance: Min assist;Mod assist Ambulation Distance (Feet): 150 Feet Assistive device: Rolling walker (2 wheeled) Gait Pattern/deviations: Step-through pattern;Decreased step length - right;Decreased stance time - right;Decreased step length -  left;Trunk flexed;Narrow base of support;Decreased dorsiflexion - right Gait velocity: Decreased Gait velocity interpretation: Below normal speed for age/gender General Gait Details: Verbal cues for hand placement on RW - cues to keep Rt hand on handgrip.  Patient requires assist to maneuver RW especially during turns.  Patient with decreased balance in standing, leaning to right side.  During turns, patient loses balance, requiring mod assist to prevent fall.  Noted Rt knee with decreased control during stance phase of gait, knee quickly extending fully with weight bearing.  Also decreased control during swing, with short step.   Stairs            Wheelchair Mobility    Modified Rankin (Stroke Patients Only) Modified Rankin (Stroke Patients Only) Pre-Morbid Rankin Score: No significant disability Modified Rankin: Moderately severe disability     Balance           Standing balance support: Bilateral upper extremity supported Standing balance-Leahy Scale: Poor Standing balance comment: Patient requires UE support and min assist to maintain balance.  Flexed posture with cues for upright stance.  Loss of balance requiring mod assist to prevent fall during gait with turns.                    Cognition Arousal/Alertness: Awake/alert Behavior During Therapy: Flat affect Overall Cognitive Status: No family/caregiver present to determine baseline cognitive functioning Area of Impairment: Safety/judgement;Problem solving;Memory;Orientation Orientation Level: Disoriented to;Time;Situation   Memory: Decreased short-term memory   Safety/Judgement: Decreased awareness of deficits;Decreased awareness of safety   Problem Solving: Slow processing;Decreased initiation;Difficulty sequencing;Requires verbal cues      Exercises      General Comments  Pertinent Vitals/Pain Pain Assessment: No/denies pain    Home Living                      Prior Function             PT Goals (current goals can now be found in the care plan section) Progress towards PT goals: Progressing toward goals    Frequency  Min 4X/week    PT Plan Current plan remains appropriate    Co-evaluation             End of Session Equipment Utilized During Treatment: Gait belt Activity Tolerance: Patient tolerated treatment well Patient left: in chair;with call bell/phone within reach;with chair alarm set     Time: 1101-1120 PT Time Calculation (min) (ACUTE ONLY): 19 min  Charges:  $Gait Training: 8-22 mins                    G Codes:      Vena Austria Jun 11, 2014, 1:35 PM Durenda Hurt. Renaldo Fiddler, [facility] Acute Rehab Services Pager [phone]

## 2014-06-10 NOTE — Care Management Note (Signed)
    Page 1 of 1   06/10/2014     2:27:32 PM CARE MANAGEMENT NOTE 06/10/2014  Patient:  Ian [name],Ian [name]   Account Number:  [id number]  Date Initiated:  06/10/2014  Documentation initiated by:  Elmer BalesOBARGE,Patton Swisher  Subjective/Objective Assessment:   Patient was admitted with acute encephalopathy.  Lives at home alone.     Action/Plan:   Will follow for discharge needs pending PT/OT evals and physician orders.   Anticipated DC Date:     Anticipated DC Plan:  IP REHAB FACILITY         Choice offered to / List presented to:             Status of service:   Medicare Important Message given?  YES (If response is "NO", the following Medicare IM given date fields will be blank) Date Medicare IM given:  06/10/2014 Medicare IM given by:  Elmer BalesOBARGE,Miamarie Moll Date Additional Medicare IM given:   Additional Medicare IM given by:    Discharge Disposition:    Per UR Regulation:  Reviewed for med. necessity/level of care/duration of stay  If discussed at Long Length of Stay Meetings, dates discussed:    Comments:  06/10/14 1155 Elmer Balesourtney Arah Aro RN, MSN, CM- Medicare IM letter provided.

## 2014-06-10 NOTE — Consult Note (Signed)
Physical Medicine and Rehabilitation Consult Reason for Consult: Bilateral punctate PCA territory and cerebellar infarcts Referring Physician: [facility]   HPI: Ian FlockDavid [name] is a 74 y.o. right handed male with history of coronary artery disease with CABG, hypertension, alcohol abuse as well as history of CVA. Patient lives alone independent with a cane/walker prior to admission. Presented 06/07/2014 with altered mental status, soiled and disheveled. Systolic blood pressure in the 190s. MRI showed multiple acute punctate infarcts in the anterior and posterior circulation. MRA with multiple intracranial large vessel high-grade stenosis. Echocardiogram with ejection fraction of 65% grade 1 diastolic dysfunction. 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 Moderate to high-grade stenosis within the distal left V4 segment. Patient did not receive TPA. Urine drug screen was negative. Neurology consulted maintained on aspirin and Plavix for CVA prophylaxis as well as subcutaneous heparin for DVT prophylaxis. He is tolerating a regular consistency diet. Physical and occupational therapy evaluations completed with recommendations of physical medicine rehabilitation consult.   Review of Systems  Gastrointestinal: Positive for constipation.  Genitourinary: Positive for urgency.  Musculoskeletal: Positive for myalgias and joint pain.  Psychiatric/Behavioral: Positive for depression.  All other systems reviewed and are negative.  Past Medical History  Diagnosis Date  . CAD (coronary artery disease)     LIMA to OM2, SVG to OM1, SVG to RCA  . HLD (hyperlipidemia)   . HTN (hypertension)   . BPH (benign prostatic hyperplasia)   . Stroke    Past Surgical History  Procedure Laterality Date  . Coronary artery bypass graft  2001  . Hernia repair     Family History  Problem Relation Age of Onset  . Arthritis    . Hypertension    .  Stroke    . Heart attack Mother   . Heart attack Father    Social History:  reports that he has been smoking Cigarettes.  He has a 20 pack-year smoking history. He has never used smokeless tobacco. He reports that he drinks about 3.0 oz of alcohol per week. He reports that he does not use illicit drugs. Allergies: No Known Allergies Medications Prior to Admission  Medication Sig Dispense Refill  . amLODipine (NORVASC) 5 MG tablet TAKE ONE TABLET BY MOUTH EVERY DAY (Patient not taking: Reported on 06/07/2014) 30 tablet 7  . carvedilol (COREG) 6.25 MG tablet Take 1 tablet (6.25 mg total) by mouth 2 (two) times daily with a meal. (Patient not taking: Reported on 06/07/2014) 90 tablet 3  . FLUoxetine (PROZAC) 20 MG tablet Take 1 tablet (20 mg total) by mouth daily. (Patient not taking: Reported on 06/07/2014) 90 tablet 3  . hydrochlorothiazide 25 MG tablet Take 1 tablet (25 mg total) by mouth daily. (Patient not taking: Reported on 06/07/2014) 90 tablet 3  . lisinopril (PRINIVIL,ZESTRIL) 20 MG tablet Take 1 tablet (20 mg total) by mouth daily. (Patient not taking: Reported on 06/07/2014) 90 tablet 3  . Multiple Vitamins-Minerals (MENS ONE DAILY PO) Take by mouth daily.      . nitroGLYCERIN (NITROSTAT) 0.4 MG SL tablet Place 1 tablet (0.4 mg total) under the tongue every 5 (five) minutes as needed for chest pain. 25 tablet 3  . simvastatin (ZOCOR) 40 MG tablet Take 1 tablet (40 mg total) by mouth at bedtime. (Patient not taking: Reported on 06/07/2014) 90 tablet 3    Home: Home Living Family/patient expects to be discharged to:: Private residence Living  Arrangements: Alone Home Layout: One level Home Equipment: Other (comment) (walking stick)  Lives With: Alone (per patient's report)  Functional History: Prior Function Level of Independence: Independent with assistive device(s) Comments: Pt reports he was amb with walking stick. Unsure of accuracy of information. Functional Status:  Mobility: Bed  Mobility Overal bed mobility: Needs Assistance Bed Mobility: Supine to Sit Supine to sit: Supervision General bed mobility comments: Supervision for safety.  Transfers Overall transfer level: Needs assistance Equipment used: Rolling walker (2 wheeled) Transfers: Sit to/from Stand Sit to Stand: Min guard General transfer comment: Min guard for safety. No physical assist needed.  Ambulation/Gait Ambulation/Gait assistance: Min assist Ambulation Distance (Feet): 150 Feet Assistive device: Rolling walker (2 wheeled) Gait Pattern/deviations: Step-through pattern, Decreased step length - right, Decreased stance time - right, Trunk flexed, Decreased dorsiflexion - right Gait velocity interpretation: Below normal speed for age/gender General Gait Details: Verbal cues to stand more erect. Pt with rt knee in flexion during stance.    ADL: ADL Overall ADL's : Needs assistance/impaired Eating/Feeding: Set up, Sitting Grooming: Wash/dry hands, Wash/dry face, Set up, Sitting Grooming Details (indicate cue type and reason): pt able to follow commands with setup for grooming tasks, including applying lotion. Slow processing Upper Body Bathing: Minimal assitance, Sitting Lower Body Bathing: Sit to/from stand, Moderate assistance Upper Body Dressing : Minimal assistance, Sitting Lower Body Dressing: Sit to/from stand, Moderate assistance Toilet Transfer: Min guard, Ambulation, RW Toilet Transfer Details (indicate cue type and reason): bed> recliner short distance General ADL Comments: Pt in bed with bed pad and linens saturated with urine. Pt advanced to EOB and OT assisted in changing gown and cleaning pt's posterior side. Pt demonstrated fair balance in standing and ability to ambulate short distance to recliner.   Cognition: Cognition Overall Cognitive Status: No family/caregiver present to determine baseline cognitive functioning Arousal/Alertness: Awake/alert Orientation Level: Oriented  X4 Attention: Sustained Sustained Attention: Impaired Sustained Attention Impairment: Verbal basic, Verbal complex Memory: Impaired Memory Impairment: Storage deficit, Retrieval deficit, Decreased recall of new information, Decreased short term memory Decreased Short Term Memory: Verbal basic Awareness: Impaired Awareness Impairment: Intellectual impairment Problem Solving: Impaired Problem Solving Impairment: Verbal basic Executive Function: Decision Making, Reasoning Reasoning: Impaired Reasoning Impairment: Verbal basic, Functional basic Decision Making: Impaired Decision Making Impairment: Functional basic Behaviors: Impulsive, Perseveration Safety/Judgment: Impaired Cognition Arousal/Alertness: Awake/alert Behavior During Therapy: WFL for tasks assessed/performed Overall Cognitive Status: No family/caregiver present to determine baseline cognitive functioning Area of Impairment: Safety/judgement, Problem solving, Memory, Orientation Orientation Level: Time, Situation (able to state today is sunday and montbut did not know date ) Memory: Decreased short-term memory Safety/Judgement: Decreased awareness of deficits, Decreased awareness of safety Problem Solving: Slow processing, Requires verbal cues, Requires tactile cues, Decreased initiation  Blood pressure 162/78, pulse 50, temperature 97.5 F (36.4 C), temperature source Oral, resp. rate 18, height 5\' 9"  (1.753 m), weight 86.728 kg (191 lb 3.2 oz), SpO2 100 %. Physical Exam  Vitals reviewed. Constitutional:  74 year old African-American right-handed male  HENT:  Head: Normocephalic.  Eyes: EOM are normal.  Neck: Normal range of motion. Neck supple. No thyromegaly present.  Cardiovascular: Normal rate and regular rhythm.   Respiratory: Breath sounds normal. No respiratory distress.  GI: Soft. Bowel sounds are normal. He exhibits no distension.  Neurological:  Patient is alert with flat affect. He does make eye contact  with examiner. Limited awareness of deficits. He was able to provide month but needed cues for year with delay processing and recall mild perseveration.  He does follow some simple commands. Moves all 4 but lacks coordination, MMT inconsistent.  Skin: Skin is warm and dry.    No results found for this or any previous visit (from the past 24 hour(s)). X-ray Chest Pa And Lateral  06/08/2014   CLINICAL DATA:  Acute encephalopathy  EXAM: CHEST  2 VIEW  COMPARISON:  06/07/2014  FINDINGS: Cardiac shadow is stable. Postsurgical changes are again noted. The lungs are clear. No focal infiltrate or sizable effusion is seen. No bony abnormality is noted.  IMPRESSION: No acute abnormality noted.   Electronically Signed   By: Alcide Clever M.D.   On: 06/08/2014 09:27   Ct Angio Neck W/cm &/or Wo/cm  06/09/2014   CLINICAL DATA:  Initial evaluation for stroke.  EXAM: CT ANGIOGRAPHY NECK  TECHNIQUE: Multidetector CT imaging of the neck was performed using the standard protocol during bolus administration of intravenous contrast. Multiplanar CT image reconstructions and MIPs were obtained to evaluate the vascular anatomy. Carotid stenosis measurements (when applicable) are obtained utilizing NASCET criteria, using the distal internal carotid diameter as the denominator.  CONTRAST:  50 cc of OMNIPAQUE IOHEXOL 350 MG/ML SOLN  COMPARISON:  Prior MRI from 06/07/2014.  FINDINGS: Aortic arch: Visualize aortic arch is of normal caliber with normal 3 vessel morphology. Moderate atheromatous plaque present within the aortic arch itself. There is noncalcified atheromatous plaque at the origin of the great vessels without associated hemodynamically significant stenosis. Subclavian arteries widely patent bilaterally. Incidental note made of a right-sided central venous catheter with tip terminating in the distal SVC.  Right carotid system: The right common carotid artery is well opacified to the level of the carotid bifurcation.  Calcified and noncalcified plaque at the carotid bifurcation/proximal right internal carotid artery present with resultant stenosis of approximately 40% by NASCET criteria. Distally, the right internal carotid artery is tortuous but well opacified to the level of the skullbase. Prominent atheromatous plaque present within the visualized cavernous right ICA.  Right external carotid arteries and its branch vessels grossly normal.  Left carotid system: Left common carotid artery well opacified to the level of the carotid bifurcation. Minimal atheromatous plaque present about the left carotid bifurcation and proximal left internal carotid artery without significant stenosis. Left ICA is mildly tortuous with medialization into the retropharyngeal space. No hemodynamically significant stenosis, dissection, or vascular occlusion identified within left ICA.  Left external carotid artery and its branches within normal limits.  Vertebral arteries:Both vertebral arteries appear occluded from their origin from the subclavian arteries. There is some distal reconstitution via muscular branches at the level of C2-3 (series 401, image 90). The reconstituted vertebral arteries are markedly small and attenuated in appearance. Moderate to high-grade stenosis of the left V4 segment proximal to the left posterior inferior cerebral artery again noted. Distally, the left vertebral artery is diminutive with poor flow.  Visualized lungs are clear. No soft tissue abnormality within the neck. Thyroid gland is normal. No adenopathy.  Advanced multilevel degenerative disc disease noted within the visualized cervical spine. No acute osseus abnormality. No worrisome lytic or blastic osseous lesions.  IMPRESSION: 1. 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 There is some distal reconstitution of the V2 segments at the level of C2-3 via muscular branches. Distally, the vertebral arteries are diminutive in  appearance. 2. Moderate to high-grade stenosis within the distal left V4 segment, similar as described on previous MRA. 3. Calcified plaque at the right carotid bifurcation/proximal right ICA with associated  short segment stenosis of approximately 40% by NASCET criteria. 4. No other hemodynamically significant stenosis identified within the neck.   Electronically Signed   By: Rise Mu M.D.   On: 06/09/2014 00:32    Assessment/Plan: Diagnosis: bi-cerebral embolic infarcts 1. Does the need for close, 24 hr/day medical supervision in concert with the patient's rehab needs make it unreasonable for this patient to be served in a less intensive setting? Yes 2. Co-Morbidities requiring supervision/potential complications: htn, cad 3. Due to bladder management, bowel management, safety, skin/wound care, disease management, medication administration, pain management and patient education, does the patient require 24 hr/day rehab nursing? Yes 4. Does the patient require coordinated care of a physician, rehab nurse, PT (1-2 hrs/day, 5 days/week), OT (1-2 hrs/day, 5 days/week) and SLP (1-2 hrs/day, 5 days/week) to address physical and functional deficits in the context of the above medical diagnosis(es)? Yes Addressing deficits in the following areas: balance, endurance, locomotion, strength, transferring, bowel/bladder control, bathing, dressing, feeding, grooming, toileting, cognition and psychosocial support 5. Can the patient actively participate in an intensive therapy program of at least 3 hrs of therapy per day at least 5 days per week? Yes 6. The potential for patient to make measurable gains while on inpatient rehab is excellent 7. Anticipated functional outcomes upon discharge from inpatient rehab are modified independent and supervision  with PT, modified independent and supervision with OT, modified independent and supervision with SLP. 8. Estimated rehab length of stay to reach the above  functional goals is: 10-14 days 9. Does the patient have adequate social supports and living environment to accommodate these discharge functional goals? Yes 10. Anticipated D/C setting: Home 11. Anticipated post D/C treatments: HH therapy and Outpatient therapy 12. Overall Rehab/Functional Prognosis: excellent  RECOMMENDATIONS: This patient's condition is appropriate for continued rehabilitative care in the following setting: CIR Patient has agreed to participate in recommended program. Potentially Note that insurance prior authorization may be required for reimbursement for recommended care.  Comment: Rehab Admissions Coordinator to follow up.  Thanks,  Ranelle Oyster, MD, [place] [facility]     06/10/2014

## 2014-06-10 NOTE — Progress Notes (Signed)
Patient Demographics  Ian [name], is a 74 y.o. male, DOB - 1941-01-24, ZOX:[id number]  Admit date - 06/07/2014   Admitting Physician Jeralyn Bennett, MD  Outpatient Primary MD for the patient is Sanda Linger, MD  LOS - 3   Chief Complaint  Patient presents with  . Altered Mental Status      Admission history of present illness/brief narrative: Ian [name] is a 74 y.o. male with a past medical history of CVA, alcohol abuse, hypertension, dyslipidemia, who was brought to the emergency department at [facility] by EMS for mental status changes.he went to a neighbors house reporting that he didn't feel well. He was found to be confused, soiled, and disheveled. Per medical records patient has history of alcoholism as patient reports his last drink was greater than one month ago. Workup in the emergency room included a CT scan of brain which showed subacute to chronic left centrum semiovale lacunar infarct along with small vessel ischemic changes. He was found to be hypertensive having systolic blood pressures in the 190s. Discussed with family who reported patient had history of alcohol abuse, but they think he did stop drinking over the last 2-3 months.   Subjective:   [name] today has, No headache, No chest pain, No abdominal pain , but he is confused, poor historian.  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 - Will start on IV thiamine, - TSH, B-12, folate within normal limit. -  RPR nonreactive,  - HIV pending  Acute CVA - MRI brain showing multiple acute punctate infarcts in the anterior and posterior circulation, with remote infarcts in the brainstem, bilateral cerebellum, and central left MCA territory. - MRA showing extensive intracranial atherosclerosis - Continue with dual antiplatelet therapy aspirin and Plavix for next 3 months, then continue with Plavix. 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  -2-D echo EF 65%, with grade 1 diastolic dysfunction. - Will need 30 days heart monitor as an outpatient, spoke with cardiology to arrange for it.   Hypertension - Uncontrolled, permissive hypertension initially, L4 gradual control over the next 2-3 days. - Resumed on home medication   Questionable Pneumonia - Repeat chest x-ray doesn't show any evidence of opacity or infiltrate, off IV Rocephin and azithromycin. - Continue with Mucinex and incentive spirometry.  Dyslipidemia - Continue with statin  History of alcohol abuse -family and neighbor report patient stopped drinking a  few weeks ago, unlikely to go into withdrawals, started on IV thiamine for possible Wernicke's.  Code Status: Full  Family Communication: spoke with family over the phone [phone] on 1/23  Disposition Plan: CIR vs SNF, will request inpatient rehabilitation consult   Procedures  None   Consults  neurology   Medications  Scheduled Meds: .  stroke: mapping our early stages of  recovery book   Does not apply Once  . amLODipine  5 mg Oral Daily  . aspirin EC  81 mg Oral Daily  . carvedilol  6.25 mg Oral BID WC  . clopidogrel  75 mg Oral Daily  . heparin  5,000 Units Subcutaneous 3 times per day  . hydrochlorothiazide  25 mg Oral Daily  . lisinopril  20 mg Oral Daily  . simvastatin  20 mg Oral QHS  . sodium chloride 0.9 % 50 mL with thiamine (B-1) 250 mg infusion   Intravenous Daily  . sodium chloride  10-40 mL Intracatheter Q12H   Continuous Infusions: . sodium chloride 10 mL/hr at 06/10/14 0203   PRN Meds:.acetaminophen **OR** acetaminophen, alum & mag hydroxide-simeth, labetalol, morphine injection, ondansetron **OR** ondansetron (ZOFRAN) IV, oxyCODONE, sodium chloride  DVT Prophylaxis  Heparin -   Lab Results  Component Value Date   PLT 286 06/09/2014    Antibiotics    Anti-infectives    Start     Dose/Rate Route Frequency Ordered Stop   06/07/14 1800  cefTRIAXone (ROCEPHIN) 1 g in dextrose 5 % 50 mL IVPB - Premix  Status:  Discontinued     1 g100 mL/hr over 30 Minutes Intravenous Every 24 hours 06/07/14 1724 06/08/14 1341   06/07/14 1800  azithromycin (ZITHROMAX) 500 mg in dextrose 5 % 250 mL IVPB  Status:  Discontinued     500 mg250 mL/hr over 60 Minutes Intravenous Every 24 hours 06/07/14 1725 06/08/14 1341   06/07/14 1715  cefTRIAXone (ROCEPHIN) 1 g in dextrose 5 % 50 mL IVPB  Status:  Discontinued     1 g100 mL/hr over 30 Minutes Intravenous Every 24 hours 06/07/14 1709 06/07/14 1724   06/07/14 1715  azithromycin (ZITHROMAX) 500 mg in dextrose 5 % 250 mL IVPB  Status:  Discontinued     500 mg250 mL/hr over 60 Minutes Intravenous Every 24 hours 06/07/14 1709 06/07/14 1724          Objective:   Filed Vitals:   06/10/14 0106 06/10/14 0504 06/10/14 0938 06/10/14 1332  BP: 146/79 162/78 123/67 123/60  Pulse: 53 50 54 55  Temp: 98 F (36.7 C) 97.5 F (36.4 C) 98.2 F (36.8 C) 97.7 F (36.5 C)  TempSrc: Oral Oral Oral Oral  Resp: Height:      Weight:      SpO2: 100% 100% 100% 100%    Wt Readings from Last 3 Encounters:  06/07/14 86.728 kg (191 lb 3.2 oz)  09/17/10 88.905 kg (196 lb)  07/17/10 94.348 kg (208 lb)     Intake/Output Summary (Last 24 hours) at 06/10/14 1344 Last data filed at 06/10/14 1300  Gross per 24 hour  Intake    240 ml  Output    300 ml  Net    -60 ml     Physical Exam  Awake Alert, confused, No new F.N deficits, Normal affect [place].AT,PERRAL Supple Neck,No JVD, No cervical lymphadenopathy appriciated.  Symmetrical Chest wall movement, Good air movement bilaterally, CTAB RRR,No Gallops,Rubs or new Murmurs, No Parasternal Heave +ve B.Sounds,  Abd Soft, No tenderness, No organomegaly appriciated, No rebound - guarding or rigidity. No Cyanosis, Clubbing or edema, No new Rash or bruise    Data Review   Micro Results No results found for this or any previous visit (from the past 240 hour(s)).  Radiology Reports Ct Angio Neck W/cm &/or Wo/cm  06/09/2014   CLINICAL DATA:  Initial evaluation for stroke.  EXAM: CT ANGIOGRAPHY NECK  TECHNIQUE: Multidetector CT imaging of the neck was performed using the standard protocol during bolus administration of intravenous contrast. Multiplanar CT image reconstructions and MIPs were obtained to evaluate the vascular anatomy. Carotid stenosis measurements (when applicable) are obtained utilizing NASCET criteria, using the distal internal carotid diameter as the denominator.  CONTRAST:  50 cc of OMNIPAQUE IOHEXOL 350 MG/ML SOLN  COMPARISON:  Prior MRI from 06/07/2014.  FINDINGS: Aortic arch: Visualize aortic arch is of normal caliber with normal 3 vessel morphology. Moderate atheromatous plaque present within the aortic arch itself. There is noncalcified atheromatous plaque at the origin of the great vessels without associated hemodynamically significant stenosis. Subclavian arteries widely patent bilaterally. Incidental note made of a right-sided  central venous catheter with tip terminating in the distal SVC.  Right carotid system: The right common carotid artery is well opacified to the level of the carotid bifurcation. Calcified and noncalcified plaque at the carotid bifurcation/proximal right internal carotid artery present with resultant stenosis of approximately 40% by NASCET criteria. Distally, the right internal carotid artery is tortuous but well opacified to the level of the skullbase. Prominent atheromatous plaque present within the visualized cavernous right ICA.  Right external carotid arteries and its branch vessels grossly normal.  Left carotid system: Left common carotid artery well opacified to the level of the carotid bifurcation. Minimal atheromatous plaque present about the left carotid bifurcation and proximal left internal carotid artery without significant stenosis. Left ICA is mildly tortuous with medialization into the retropharyngeal space. No hemodynamically significant stenosis, dissection, or vascular occlusion identified within left ICA.  Left external carotid artery and its branches within normal limits.  Vertebral arteries:Both vertebral arteries appear occluded from their origin from the subclavian arteries. There is some distal reconstitution via muscular branches at the level of C2-3 (series 401, image 90). The reconstituted vertebral arteries are markedly small and attenuated in appearance. Moderate to high-grade stenosis of the left V4 segment proximal to the left posterior inferior cerebral artery again noted. Distally, the left vertebral artery is diminutive with poor flow.  Visualized lungs are clear. No soft tissue abnormality within the neck. Thyroid gland is normal. No adenopathy.  Advanced multilevel degenerative disc disease noted within the visualized cervical spine. No acute osseus abnormality. No worrisome lytic or blastic osseous lesions.  IMPRESSION: 1. 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 There is some distal reconstitution of the V2 segments at the level of C2-3 via muscular branches. Distally, the vertebral arteries are diminutive in appearance. 2. Moderate to high-grade stenosis within the distal left V4 segment, similar as described on previous MRA. 3. Calcified plaque at the right carotid bifurcation/proximal right ICA with associated short segment stenosis of approximately 40% by NASCET criteria. 4. No other hemodynamically significant stenosis identified within the neck.   Electronically Signed   By: Rise Mu M.D.   On: 06/09/2014 00:32    CBC  Recent Labs Lab 06/07/14 1436 06/07/14 1745 06/08/14 0451 06/09/14 0425  WBC 4.5 6.1 5.9 6.1  HGB 15.5 14.9 14.5 13.2  HCT  47.6 45.1 45.0 41.3  PLT 290 284 280 286  MCV 71.0* 70.1* 71.4* 70.6*  MCH 23.1* 23.2* 23.0* 22.6*  MCHC 32.6 33.0 32.2 32.0  RDW 14.9 15.0 15.4 15.2  LYMPHSABS 1.1  --   --   --   MONOABS 0.8  --   --   --   EOSABS 0.0  --   --   --   BASOSABS 0.0  --   --   --     Chemistries   Recent Labs Lab 06/07/14 1436 06/07/14 1745 06/08/14 0451 06/09/14 0425  NA 140  --  141 138  K 3.8  --  4.0 3.7  CL 107  --  108 105  CO2 23  --  19 26  GLUCOSE 126*  --  91 88  BUN 11  --  11 10  CREATININE 1.30 1.22 1.20 0.99  CALCIUM 9.7  --  9.1 8.8  AST 20  --   --   --   ALT 11  --   --   --   ALKPHOS 88  --   --   --   BILITOT 0.9  --   --   --    ------------------------------------------------------------------------------------------------------------------ estimated creatinine clearance is 73.6 mL/min (by C-G formula based on Cr of 0.99). ------------------------------------------------------------------------------------------------------------------  Recent Labs  06/08/14 0451  HGBA1C 5.7*   ------------------------------------------------------------------------------------------------------------------  Recent Labs  06/08/14 0451  CHOL 175   HDL 25*  LDLCALC 133*  TRIG 85  CHOLHDL 7.0   ------------------------------------------------------------------------------------------------------------------  Recent Labs  06/07/14 1745  TSH 0.912   ------------------------------------------------------------------------------------------------------------------  Recent Labs  06/07/14 1745  VITAMINB12 666  FOLATE 7.4    Coagulation profile  Recent Labs Lab 06/07/14 1436  INR 1.05    No results for input(s): DDIMER in the last 72 hours.  Cardiac Enzymes No results for input(s): CKMB, TROPONINI, MYOGLOBIN in the last 168 hours.  Invalid input(s): CK ------------------------------------------------------------------------------------------------------------------ Invalid input(s): POCBNP     Time Spent in minutes   30 minutes   Shameika Speelman M.D on 06/10/2014 at 1:44 PM  Between 7am to 7pm - Pager - [phone]  After 7pm go to [email or web address] - password [email or web address]  And look for the night coverage person covering for me after hours  [facility] Office  [phone]   **Disclaimer: This note may have been dictated with voice recognition software. Similar sounding words can inadvertently be transcribed and this note may contain transcription errors which may not have been corrected upon publication of note.**

## 2014-06-10 NOTE — Progress Notes (Signed)
Inpatient Rehabilitation  I met with Mr. Ian [name] at the bedside to discuss his post acute rehab options. Pt. has a fair degree of confusion and was not able to provide accurate information or fully process information I was giving him.   I then phoned his emergency contact,  [name] who deferred communication to her husband Ian [name].  I informed Ian [name] of pt's options for rehabilitation. He indicates he and [name] .  I informed Ian [name] of this and of possible admission tomorrow pending bed availability and medical readiness.    Mr.and Mrs. Ian [name] understand that pt. Will likely need SNF if bed on rehab not available.  Will update medical team tomorrow am on bed status once I return from [facility].  Please call if questions.  [place] Admissions Coordinator Cell [phone] Office [phone]

## 2014-06-10 NOTE — Progress Notes (Signed)
Occupational Therapy Treatment Patient Details Name: Ian FlockDavid Comley MRN: [id number] DOB: 03/27/1941 Today's Date: 06/10/2014    History of present illness Pt adm with AMS. 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 Past medical history of CVA, alcohol abuse, hypertension   OT comments  Pt progressing toward OT goals.  He is able to perform BADLs with min A.  He requires verbal cues for sequencing and problem solving, and requires increased time to initiate and perform tasks.   Continues to demonstrate decreased safety awareness and decreased awareness of deficits.   Follow Up Recommendations  CIR;Supervision/Assistance - 24 hour    Equipment Recommendations  None recommended by OT    Recommendations for Other Services      Precautions / Restrictions Precautions Precautions: Fall       Mobility Bed Mobility Overal bed mobility: Needs Assistance Bed Mobility: Supine to Sit;Sit to Supine     Supine to sit: Supervision Sit to supine: Supervision   General bed mobility comments: verbal cues and increased time to complete  Transfers Overall transfer level: Needs assistance Equipment used: Rolling walker (2 wheeled) Transfers: Sit to/from UGI CorporationStand;Stand Pivot Transfers Sit to Stand: Min assist Stand pivot transfers: Min guard       General transfer comment: verbal cues for hand placement and min A to power up into standing     Balance Overall balance assessment: Needs assistance Sitting-balance support: Feet supported Sitting balance-Leahy Scale: Good     Standing balance support: During functional activity Standing balance-Leahy Scale: Fair                     ADL       Grooming: Oral care;Minimal assistance;Standing       Lower Body Bathing: Minimal assistance;Sit to/from stand       Lower Body Dressing: Minimal  assistance;Sit to/from stand   Toilet Transfer: Minimal assistance;Ambulation;Regular Toilet;BSC;RW             General ADL Comments: Pt able to apply lotion to bil. LEs and feet to simulate bathing with min cues for sequencing.  Min A for LB ADLs due to balance deficits.  Requires verbal cues and increased time       Vision                     Perception     Praxis      Cognition   Behavior During Therapy: Flat affect Overall Cognitive Status: Impaired/Different from baseline Area of Impairment: Safety/judgement;Problem solving;Memory;Orientation Orientation Level: Time;Situation   Memory: Decreased short-term memory    Safety/Judgement: Decreased awareness of deficits;Decreased awareness of safety   Problem Solving: Slow processing;Difficulty sequencing;Requires verbal cues;Requires tactile cues      Extremity/Trunk Assessment               Exercises     Shoulder Instructions       General Comments      Pertinent Vitals/ Pain       Pain Assessment: No/denies pain  Home Living                                          Prior Functioning/Environment              Frequency Min 2X/week  Progress Toward Goals  OT Goals(current goals can now be found in the care plan section)     ADL Goals Pt Will Perform Grooming: with modified independence;standing Pt Will Perform Lower Body Bathing: with modified independence;sit to/from stand Pt Will Perform Lower Body Dressing: with modified independence;sit to/from stand Pt Will Transfer to Toilet: with modified independence;ambulating Pt Will Perform Toileting - Clothing Manipulation and hygiene: with modified independence;sit to/from stand  Plan Discharge plan remains appropriate    Co-evaluation                 End of Session Equipment Utilized During Treatment: Rolling walker   Activity Tolerance Patient tolerated treatment well   Patient Left in bed;with  call bell/phone within reach;with bed alarm set   Nurse Communication Mobility status        Time: 1610-9604 OT Time Calculation (min): 25 min  Charges: OT General Charges $OT Visit: 1 Procedure OT Treatments $Self Care/Home Management : 23-37 mins  Sharalee Witman M 06/10/2014, 3:56 PM

## 2014-06-10 NOTE — Clinical Social Work Note (Signed)
Clinical Social Worker has assessed patient and pt's family. Full psychosocial assessment to follow.  Derenda FennelBashira Ketsia Linebaugh, MSW, LCSWA [phone] 06/10/2014 1:07 PM

## 2014-06-11 DIAGNOSIS — I10 Essential (primary) hypertension: Secondary | ICD-10-CM | POA: Diagnosis not present

## 2014-06-11 DIAGNOSIS — G9340 Encephalopathy, unspecified: Secondary | ICD-10-CM | POA: Diagnosis not present

## 2014-06-11 DIAGNOSIS — R8290 Unspecified abnormal findings in urine: Secondary | ICD-10-CM | POA: Diagnosis present

## 2014-06-11 DIAGNOSIS — R55 Syncope and collapse: Secondary | ICD-10-CM | POA: Diagnosis not present

## 2014-06-11 DIAGNOSIS — R278 Other lack of coordination: Secondary | ICD-10-CM | POA: Diagnosis not present

## 2014-06-11 DIAGNOSIS — Z7982 Long term (current) use of aspirin: Secondary | ICD-10-CM | POA: Diagnosis not present

## 2014-06-11 DIAGNOSIS — R079 Chest pain, unspecified: Secondary | ICD-10-CM | POA: Diagnosis not present

## 2014-06-11 DIAGNOSIS — E785 Hyperlipidemia, unspecified: Secondary | ICD-10-CM | POA: Diagnosis present

## 2014-06-11 DIAGNOSIS — Z8673 Personal history of transient ischemic attack (TIA), and cerebral infarction without residual deficits: Secondary | ICD-10-CM | POA: Diagnosis not present

## 2014-06-11 DIAGNOSIS — G9349 Other encephalopathy: Secondary | ICD-10-CM | POA: Diagnosis not present

## 2014-06-11 DIAGNOSIS — F0391 Unspecified dementia with behavioral disturbance: Secondary | ICD-10-CM | POA: Diagnosis not present

## 2014-06-11 DIAGNOSIS — F0390 Unspecified dementia without behavioral disturbance: Secondary | ICD-10-CM | POA: Diagnosis present

## 2014-06-11 DIAGNOSIS — Z79899 Other long term (current) drug therapy: Secondary | ICD-10-CM | POA: Diagnosis not present

## 2014-06-11 DIAGNOSIS — R001 Bradycardia, unspecified: Secondary | ICD-10-CM | POA: Diagnosis not present

## 2014-06-11 DIAGNOSIS — Z7902 Long term (current) use of antithrombotics/antiplatelets: Secondary | ICD-10-CM | POA: Diagnosis not present

## 2014-06-11 DIAGNOSIS — F17210 Nicotine dependence, cigarettes, uncomplicated: Secondary | ICD-10-CM | POA: Diagnosis present

## 2014-06-11 DIAGNOSIS — R05 Cough: Secondary | ICD-10-CM | POA: Diagnosis not present

## 2014-06-11 DIAGNOSIS — F1020 Alcohol dependence, uncomplicated: Secondary | ICD-10-CM | POA: Diagnosis present

## 2014-06-11 DIAGNOSIS — E860 Dehydration: Secondary | ICD-10-CM | POA: Diagnosis present

## 2014-06-11 DIAGNOSIS — I2510 Atherosclerotic heart disease of native coronary artery without angina pectoris: Secondary | ICD-10-CM | POA: Diagnosis present

## 2014-06-11 DIAGNOSIS — N179 Acute kidney failure, unspecified: Secondary | ICD-10-CM | POA: Diagnosis not present

## 2014-06-11 DIAGNOSIS — E875 Hyperkalemia: Secondary | ICD-10-CM | POA: Diagnosis not present

## 2014-06-11 DIAGNOSIS — I639 Cerebral infarction, unspecified: Secondary | ICD-10-CM | POA: Diagnosis not present

## 2014-06-11 DIAGNOSIS — N400 Enlarged prostate without lower urinary tract symptoms: Secondary | ICD-10-CM | POA: Diagnosis not present

## 2014-06-11 DIAGNOSIS — I69328 Other speech and language deficits following cerebral infarction: Secondary | ICD-10-CM | POA: Diagnosis not present

## 2014-06-11 DIAGNOSIS — R531 Weakness: Secondary | ICD-10-CM | POA: Diagnosis not present

## 2014-06-11 DIAGNOSIS — R488 Other symbolic dysfunctions: Secondary | ICD-10-CM | POA: Diagnosis not present

## 2014-06-11 DIAGNOSIS — R410 Disorientation, unspecified: Secondary | ICD-10-CM | POA: Diagnosis not present

## 2014-06-11 DIAGNOSIS — Z951 Presence of aortocoronary bypass graft: Secondary | ICD-10-CM | POA: Diagnosis not present

## 2014-06-11 DIAGNOSIS — I6931 Cognitive deficits following cerebral infarction: Secondary | ICD-10-CM | POA: Diagnosis not present

## 2014-06-11 DIAGNOSIS — J40 Bronchitis, not specified as acute or chronic: Secondary | ICD-10-CM | POA: Diagnosis not present

## 2014-06-11 DIAGNOSIS — Z8249 Family history of ischemic heart disease and other diseases of the circulatory system: Secondary | ICD-10-CM | POA: Diagnosis not present

## 2014-06-11 DIAGNOSIS — R40242 Glasgow coma scale score 9-12: Secondary | ICD-10-CM | POA: Diagnosis not present

## 2014-06-11 DIAGNOSIS — E869 Volume depletion, unspecified: Secondary | ICD-10-CM | POA: Diagnosis not present

## 2014-06-11 DIAGNOSIS — Y92239 Unspecified place in hospital as the place of occurrence of the external cause: Secondary | ICD-10-CM | POA: Diagnosis not present

## 2014-06-11 DIAGNOSIS — I951 Orthostatic hypotension: Secondary | ICD-10-CM | POA: Diagnosis present

## 2014-06-11 DIAGNOSIS — M6281 Muscle weakness (generalized): Secondary | ICD-10-CM | POA: Diagnosis not present

## 2014-06-11 DIAGNOSIS — W19XXXA Unspecified fall, initial encounter: Secondary | ICD-10-CM | POA: Diagnosis not present

## 2014-06-11 MED ORDER — THERA VITAL M PO TABS: 1.0000 | ORAL_TABLET | Freq: Every day | ORAL | Status: AC

## 2014-06-11 MED ORDER — VITAMIN B-1 100 MG PO TABS: 200.0000 mg | ORAL_TABLET | Freq: Every day | ORAL | Status: AC

## 2014-06-11 MED ORDER — ACETAMINOPHEN 325 MG PO TABS: 650.0000 mg | ORAL_TABLET | Freq: Four times a day (QID) | ORAL | Status: DC | PRN

## 2014-06-11 MED ORDER — ASPIRIN 81 MG PO TBEC: 81.0000 mg | DELAYED_RELEASE_TABLET | Freq: Every day | ORAL | Status: AC

## 2014-06-11 MED ORDER — CLOPIDOGREL BISULFATE 75 MG PO TABS: 75.0000 mg | ORAL_TABLET | Freq: Every day | ORAL | Status: AC

## 2014-06-11 MED ORDER — FOLIC ACID 1 MG PO TABS: 1.0000 mg | ORAL_TABLET | Freq: Every day | ORAL | Status: AC

## 2014-06-11 NOTE — Progress Notes (Signed)
Pt d/c to snf by ambulance. Assessment stable. [facility] [place] [facility] [facility]

## 2014-06-11 NOTE — Progress Notes (Signed)
Physical Therapy Treatment Patient Details Name: Ian [name] MRN: [id number] DOB: 06/01/1940 Today's Date: 06/11/2014    History of Present Illness Pt adm with AMS. 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 Past medical history of CVA, alcohol abuse, hypertension    PT Comments    Patient progressing slowly with mobility. Continues to exhibit balance deficits esp noted with head turns or turn with RW requiring Min A to prevent fall. Instability noted in RLE during stance worsened when fatigued but no knee buckling noted during gait. Will continue to follow and progress as tolerated.   Follow Up Recommendations  CIR     Equipment Recommendations  Rolling walker with 5" wheels    Recommendations for Other Services       Precautions / Restrictions Precautions Precautions: Fall Precaution Comments: incontinent of urine in hallway, "I need to pee" as he is already urinating. Restrictions Weight Bearing Restrictions: No    Mobility  Bed Mobility Overal bed mobility: Needs Assistance Bed Mobility: Supine to Sit     Supine to sit: Supervision     General bed mobility comments: SUpervision for safety.   Transfers Overall transfer level: Needs assistance Equipment used: Rolling walker (2 wheeled) Transfers: Sit to/from Stand Sit to Stand: Min assist         General transfer comment: Min A to rise from sitting with cues for hand placement as pt with tendency to pull up RW.  Transferred to chair.  Ambulation/Gait Ambulation/Gait assistance: Min assist Ambulation Distance (Feet): 150 Feet Assistive device: Rolling walker (2 wheeled) Gait Pattern/deviations: Step-through pattern;Decreased step length - right;Decreased stance time - right;Narrow base of support;Decreased dorsiflexion - right;Shuffle;Trunk flexed Gait velocity: Decreased    General Gait Details: Requires VC's to navigate RW with multiple instances of bumping into wall/counter on right side. Decreased knee control during stance with partial knee buckling and hyperextension thrust. Difficulty with head turns and with turning with RW. Shuffling noted towards end of gait bilaterally with difficulty advancing RLE>LLE.    Stairs            Wheelchair Mobility    Modified Rankin (Stroke Patients Only) Modified Rankin (Stroke Patients Only) Pre-Morbid Rankin Score: No significant disability Modified Rankin: Moderately severe disability     Balance Overall balance assessment: Needs assistance Sitting-balance support: Feet supported;No upper extremity supported Sitting balance-Leahy Scale: Good Sitting balance - Comments: ABle to reach outside BoS and doff socks, required assist donning socks. No LOB.   Standing balance support: During functional activity Standing balance-Leahy Scale: Poor Standing balance comment: Requires BUE support on RW for balance./safety.                    Cognition Arousal/Alertness: Awake/alert Behavior During Therapy: Flat affect Overall Cognitive Status: Impaired/Different from baseline Area of Impairment: Safety/judgement;Problem solving;Orientation Orientation Level: Disoriented to;Time;Situation       Safety/Judgement: Decreased awareness of safety;Decreased awareness of deficits   Problem Solving: Slow processing;Requires verbal cues;Difficulty sequencing      Exercises      General Comments General comments (skin integrity, edema, etc.): Pt incontinent of urine during gait. Assisted with changing gown and socks and getting cleaned up post ambulation bout.      Pertinent Vitals/Pain Pain Assessment: No/denies pain    Home Living  Prior Function            PT Goals (current goals can now be found in the care plan section) Progress towards PT goals: Progressing toward  goals    Frequency  Min 4X/week    PT Plan Current plan remains appropriate    Co-evaluation             End of Session Equipment Utilized During Treatment: Gait belt Activity Tolerance: Patient tolerated treatment well Patient left: in chair;with call bell/phone within reach;with chair alarm set;with family/visitor present     Time: 1200-1223 PT Time Calculation (min) (ACUTE ONLY): 23 min  Charges:  $Gait Training: 8-22 mins $Therapeutic Activity: 8-22 mins                    G CodesAlvie Heidelberg A 06-20-14, 12:37 PM  Alvie Heidelberg, PT, DPT [phone]

## 2014-06-11 NOTE — Progress Notes (Addendum)
Inpatient Rehabilitation  I unfortunately will not have a bed to offer Mr. Mersman and his family today.  I have discussed this with Tobin ChadSam Roberts, brother in law and husband of Hildred AlaminSandra Roberts. I reinforced with Mr. Su HiltRoberts that if pt. is medically stable today, family will need to decide on a SNF today for transition.  He verbalizes understanding and states pt's other brother is to come up to visit with pt. about lunch time.   I have updated Courtney Robarge CM and she will communicate this to The Sherwin-WilliamsBashira Nixon, SW.  I will stop by to inform patient a little later this am.  Please call if questions.    Weldon PickingSusan Neil Errickson PT Inpatient Rehab Admissions Coordinator Cell [phone] Office [phone]   1020:  I just spoke with Mr. Lorinda CreedCurtain and informed him that I have no rehab bed to offer him today and that I spoke with Sam to give details.  Pt. Did not seem to grasp what I was telling him and said he had no questions when I asked. I also informed Dr. Randol KernElgergawy of bed situation.   I will sign off.  Thanks,  Weldon PickingSusan Gifford Ballon PT Inpatient Rehab Admissions Coordinator Cell [phone] Office [phone]

## 2014-06-11 NOTE — Clinical Social Work Note (Addendum)
Clinical Social Worker facilitated patient discharge including contacting patient family and facility to confirm patient discharge plans.  Clinical information faxed to facility and family agreeable with plan.  RN has agreed to arrange ambulance [facility]. RN to call report prior to discharge.  Pt's brother to complete admissions paperwork on pt's behalf.   Clinical Social Worker will sign off for now as social work intervention is no longer needed. Please consult us again if new need arises.  Ian [name], MSW, LCSWA [phone] 06/11/2014 3:28 PM

## 2014-06-11 NOTE — Clinical Social Work Psychosocial (Signed)
Clinical Social Work Department BRIEF PSYCHOSOCIAL ASSESSMENT 06/11/2014  Patient:  Ian [name],Ian [name]     Account Number:  [id number]     Admit date:  06/07/2014  Clinical Social Worker:  Glendon Axe, CLINICAL SOCIAL WORKER  Date/Time:  06/10/2014 03:11 PM  Referred by:  Physician  Date Referred:  06/10/2014 Referred for  SNF Placement   Other Referral:   Interview type:  Other - See comment Other interview type:   CSW met with pt and pt's brother and brother-in-law present at bedside.    PSYCHOSOCIAL DATA Living Status:  ALONE Admitted from facility:   Level of care:   Primary support name:  Lawerance Bach Primary support relationship to patient:  SIBLING Degree of support available:   Strong    CURRENT CONCERNS Current Concerns  Post-Acute Placement   Other Concerns:    SOCIAL WORK ASSESSMENT / PLAN Clinical Social Worker met with pt and pt's brother and brother-in-law at length in reference to post-acute placement for SNF. CSW explained CSW role and SNF process. Pt's brother-in-law reported he believes pt would benefit from SNF placement however would like pt's brother and sister to make final decision. Pt's brother reported he is also agreeable to SNF and chooses bed at [facility]. CSW will continue to follow pt and pt's family for continued support and to facilitate pt's discharge once medically stable.   Assessment/plan status:  Psychosocial Support/Ongoing Assessment of Needs Other assessment/ plan:   Information/referral to community resources:   SNF information/list provided.    PATIENT'S/FAMILY'S RESPONSE TO PLAN OF CARE: Pt sitting at bedside alert and disoriented. Pt agreeable to SNF placement and stated he would like to be closer to his home. Pt's brother and brother-in-law also agreeable to SNF placement. Pt's brother-in-law is invovled in pt's care, pleasant and appreciated social work intervention.       Glendon Axe, MSW, LCSWA [phone] 06/11/2014  3:21 PM

## 2014-06-11 NOTE — Discharge Summary (Signed)
Ian [name], 74 y.o., DOB 06-27-1940, MRN [id number]. Admission date: 06/07/2014 Discharge Date 06/11/2014 Primary MD Sanda Linger, MD Admitting Physician Jeralyn Bennett, MD   PCP please follow-up on: - Recheck CBC, BMP during next visit. - Please follow on the results of 30 days heart monitor monitor, which is being arranged by [facility] care - Patient is being discharged on dual antiplatelet therapy including aspirin and Plavix, in 3 months continue only with Plavix.  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 CAD (coronary artery disease)     LIMA to OM2, SVG to OM1, SVG to RCA  . HLD (hyperlipidemia)   . HTN (hypertension)   . BPH (benign prostatic hyperplasia)   . Stroke     Past Surgical History  Procedure Laterality Date  . Coronary artery bypass graft  2001  . 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 male with a past medical history of CVA,  alcohol abuse, hypertension, dyslipidemia, who was brought to the emergency department at [facility] by EMS for mental status changes.he went to a neighbors house reporting that he didn't feel well. He was found to be confused, soiled, and disheveled. Per medical records patient has history of alcoholism as patient reports his last drink was greater than one month ago. Workup in the emergency room included a CT scan of brain which showed subacute to chronic left centrum semiovale lacunar infarct along with small vessel ischemic changes. He was found to be hypertensive having systolic blood pressures in the 190s. Discussed with family who reported patient had history of alcohol abuse, but they think he did stop drinking over the last 2-3 months as well he was noncompliant with his medication, patient was started on aspirin and Plavix,LDL was found to be elevated, but patient has been noncompliant with his statin.  Altered mental status/encephalopathy:  - This is most likely related to acute CVA, but as well there is likely an other contributing factors, including dementia at baseline, as evident on imaging, as well patient with known history of alcohol abuse, with possibility of Wernick encephalopathy as well. - Treated with IV thiamine, will be discharged on 200 mg oral daily. - TSH, B-12, folate within normal limit. - RPR nonreactive,  - HIV nonreactive  Acute CVA - MRI brain showing multiple acute punctate infarcts in the anterior and posterior circulation, with remote infarcts in the brainstem, bilateral cerebellum, and central left  MCA territory. - MRA showing extensive intracranial atherosclerosis - Continue with dual antiplatelet therapy aspirin and Plavix for next 3 months, then continue with Plavix. 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  -2-D echo EF 65%, with grade 1 diastolic dysfunction. - Will need 30 days heart monitor as an outpatient, spoke with cardiology to arrange for it as an outpatient, PCP will need to follow with the results on 30 days monitor.  Hypertension - Uncontrolled on presentation, permissive hypertension initially,  - Blood pressure is acceptable on discharge, as resumed home medication.   Questionable Pneumonia - Repeat chest x-ray doesn't show any evidence of opacity or infiltrate, off IV Rocephin and azithromycin. - Continue with Mucinex and incentive spirometry.  Dyslipidemia - Continue with statin  History of alcohol abuse -family and neighbor report patient stopped drinking a few weeks ago, no evidence of withdrawals during hospital stay, started on IV thiamine for possible Wernicke's, will discharged on thiamine 200 mg oral daily.  Consults   Neurology Inpatient rehabilitation  Significant Tests:  See full reports for all details    X-ray Chest Pa And Lateral  06/08/2014   CLINICAL DATA:  Acute encephalopathy  EXAM: CHEST  2 VIEW  COMPARISON:  06/07/2014  FINDINGS: Cardiac shadow is stable. Postsurgical changes are again noted. The lungs are clear. No focal infiltrate or sizable effusion is seen. No bony abnormality is noted.  IMPRESSION: No acute abnormality noted.   Electronically Signed   By: Alcide Clever M.D.   On: 06/08/2014 09:27   Dg Chest 2 View  06/07/2014   CLINICAL DATA:  Altered mental status, failure to thrive  EXAM: CHEST  2 VIEW  COMPARISON:  None.  FINDINGS: The cardiac shadow is within normal limits. Postsurgical changes are noted. The lungs are well aerated bilaterally without focal infiltrate or sizable effusion. A few scattered calcifications are noted likely related to prior granulomatous disease. No acute bony abnormality is seen.  IMPRESSION: No acute abnormality noted.   Electronically Signed   By: Alcide Clever M.D.   On:  06/07/2014 15:11   Ct Head Wo Contrast  06/07/2014   CLINICAL DATA:  74 year old male with failure to thrive. Confusion and altered mentation  EXAM: CT HEAD WITHOUT CONTRAST  TECHNIQUE: Contiguous axial images were obtained from the base of the skull through the vertex without intravenous contrast.  COMPARISON:  None.  FINDINGS: Low attenuation within the subcortical and periventricular white matter is identified compatible with chronic small vessel ischemic disease. Focal area of low attenuation within the left centrum semiovale is identified consistent with subacute to chronic lacunar infarct, image number 19/series 201. Prominence of the sulci and ventricles are identified consistent with brain atrophy. No evidence for acute brain infarct, hemorrhage or mass. The paranasal sinuses are clear. The mastoid air cells are also clear.  IMPRESSION: 1. Small vessel ischemic disease and brain atrophy. 2. 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   Electronically Signed   By: Signa Kell M.D.   On: 06/07/2014 15:02   Ct Angio Neck W/cm &/or Wo/cm  06/09/2014   CLINICAL DATA:  Initial evaluation for stroke.  EXAM: CT ANGIOGRAPHY NECK  TECHNIQUE: Multidetector CT imaging of the neck was performed using the standard protocol during bolus administration of intravenous contrast. Multiplanar CT image reconstructions and MIPs were  obtained to evaluate the vascular anatomy. Carotid stenosis measurements (when applicable) are obtained utilizing NASCET criteria, using the distal internal carotid diameter as the denominator.  CONTRAST:  50 cc of OMNIPAQUE IOHEXOL 350 MG/ML SOLN  COMPARISON:  Prior MRI from 06/07/2014.  FINDINGS: Aortic arch: Visualize aortic arch is of normal caliber with normal 3 vessel morphology. Moderate atheromatous plaque present within the aortic arch itself. There is noncalcified atheromatous plaque at the origin of the great vessels without associated hemodynamically significant  stenosis. Subclavian arteries widely patent bilaterally. Incidental note made of a right-sided central venous catheter with tip terminating in the distal SVC.  Right carotid system: The right common carotid artery is well opacified to the level of the carotid bifurcation. Calcified and noncalcified plaque at the carotid bifurcation/proximal right internal carotid artery present with resultant stenosis of approximately 40% by NASCET criteria. Distally, the right internal carotid artery is tortuous but well opacified to the level of the skullbase. Prominent atheromatous plaque present within the visualized cavernous right ICA.  Right external carotid arteries and its branch vessels grossly normal.  Left carotid system: Left common carotid artery well opacified to the level of the carotid bifurcation. Minimal atheromatous plaque present about the left carotid bifurcation and proximal left internal carotid artery without significant stenosis. Left ICA is mildly tortuous with medialization into the retropharyngeal space. No hemodynamically significant stenosis, dissection, or vascular occlusion identified within left ICA.  Left external carotid artery and its branches within normal limits.  Vertebral arteries:Both vertebral arteries appear occluded from their origin from the subclavian arteries. There is some distal reconstitution via muscular branches at the level of C2-3 (series 401, image 90). The reconstituted vertebral arteries are markedly small and attenuated in appearance. Moderate to high-grade stenosis of the left V4 segment proximal to the left posterior inferior cerebral artery again noted. Distally, the left vertebral artery is diminutive with poor flow.  Visualized lungs are clear. No soft tissue abnormality within the neck. Thyroid gland is normal. No adenopathy.  Advanced multilevel degenerative disc disease noted within the visualized cervical spine. No acute osseus abnormality. No worrisome lytic or  blastic osseous lesions.  IMPRESSION: 1. 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 There is some distal reconstitution of the V2 segments at the level of C2-3 via muscular branches. Distally, the vertebral arteries are diminutive in appearance. 2. Moderate to high-grade stenosis within the distal left V4 segment, similar as described on previous MRA. 3. Calcified plaque at the right carotid bifurcation/proximal right ICA with associated short segment stenosis of approximately 40% by NASCET criteria. 4. No other hemodynamically significant stenosis identified within the neck.   Electronically Signed   By: Rise Mu M.D.   On: 06/09/2014 00:32   Mr Brain Wo Contrast  06/07/2014   CLINICAL DATA:  Acute encephalopathy.  EXAM: MRI HEAD WITHOUT CONTRAST  MRA HEAD WITHOUT CONTRAST  TECHNIQUE: Multiplanar, multiecho pulse sequences of the brain and surrounding structures were obtained without intravenous contrast. Angiographic images of the head were obtained using MRA technique without contrast.  COMPARISON:  Head CT from earlier the same day  FINDINGS: MRI HEAD FINDINGS  Calvarium and upper cervical spine: No focal marrow signal abnormality.  The posterior ring of C1 is hypoplastic, resulting in deformity of the posterior spinal cord, similar to increased from 2008. Degenerative endplate ridging C3-4 also deforms the ventral cord at this level.  Orbits: No significant findings.  Sinuses: Clear. Mastoid and middle ears are clear.  Brain: Numerous punctate acute infarcts in the infra and supratentorial brain. Infratentorially, the infarcts are distributed throughout the bilateral cerebellum, up to 9 mm in diameter on the right. There could be a punctate infarct in the left upper pons, but small size limits certainty. The cerebral infarcts are clustered in the occipital and parietal cortex, although small infarcts are present in the bilateral frontal lobes. No hemorrhagic  conversion. Arterial findings discussed below.  There is extensive remote ischemic injury. In the posterior fossa, there have been remote infarcts of the bilateral pons and cerebellar hemispheres. There has been a large central MCA territory infarct on the left with loss of the insula and opercular cortex. An associated lacune in the left corona radiata is also a remote. Ischemic gliosis is confluent throughout the bilateral cerebral white matter.  There 2 small foci of remote hemorrhage in the right parietal cortex.  No hydrocephalus, mass lesion, or shift.  MRA HEAD FINDINGS  Posterior circulation: The vertebral arteries are balanced and small. There is a moderate to high-grade stenosis of the left V4 segment proximal to the patent PICA. Afterward, the left vertebral artery is diminutive with poor flow. The majority of the basilar flow comes from the right vertebral artery which has a hypoplastic PICA and dominant AICA. The basilar artery is hypoplastic in the setting of large posterior communicating arteries, likely exacerbated by inflow disease. On conventional imaging there is no signal change to suggest superimposed dissection. There is a high-grade stenosis of the left P1 segment, well compensated by large posterior communicating artery. Moderate multi focal atherosclerotic narrowing of the bilateral PCAs.  Anterior circulation: Notable anatomic variants include large posterior communicating arteries, hypoplastic right A1, and duplicated anterior communicating arteries.  The right cavernous carotid has atherosclerosis along its entirety, with at least moderate stenosis at its mid portion. The upper division of the right MCA has a high-grade short-segment stenosis just beyond the bifurcation.  There is atherosclerotic irregularity of the left cavernous carotid without notable narrowing. The left A1 and MCA branches are widely patent.  There is no evidence of aneurysm (when accounting for duplicated anterior  communicating artery).  Unusual prominence of right mesencephalic and prepontine vein with time-of-flight signal. Despite this, there is no distention typical of an arterial venous malformation or fistula.  IMPRESSION: 1. Multiple acute punctate infarcts in the anterior and posterior circulation. This pattern suggests a central embolic process. 2. Remote infarcts of the brainstem, bilateral cerebellum, and central left MCA territory. 3. Extensive intracranial atherosclerosis with advanced stenoses of the left V4, upper division right MCA branch, right cavernous carotid, and left P1. 4. 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   Electronically Signed   By: Tiburcio Pea M.D.   On: 06/07/2014 20:58   Mr Maxine Glenn Head/brain Wo Cm  06/07/2014   CLINICAL DATA:  Acute encephalopathy.  EXAM: MRI HEAD WITHOUT CONTRAST  MRA HEAD WITHOUT CONTRAST  TECHNIQUE: Multiplanar, multiecho pulse sequences of the brain and surrounding structures were obtained without intravenous contrast. Angiographic images of the head were obtained using MRA technique without contrast.  COMPARISON:  Head CT from earlier the same day  FINDINGS: MRI HEAD FINDINGS  Calvarium and upper cervical spine: No focal marrow signal abnormality.  The posterior ring of C1 is hypoplastic, resulting in deformity of the posterior spinal cord, similar to increased from 2008. Degenerative endplate ridging C3-4 also deforms the ventral cord at this level.  Orbits: No significant findings.  Sinuses: Clear. Mastoid and  middle ears are clear.  Brain: Numerous punctate acute infarcts in the infra and supratentorial brain. Infratentorially, the infarcts are distributed throughout the bilateral cerebellum, up to 9 mm in diameter on the right. There could be a punctate infarct in the left upper pons, but small size limits certainty. The cerebral infarcts are clustered in the occipital and parietal cortex, although small infarcts are present in the  bilateral frontal lobes. No hemorrhagic conversion. Arterial findings discussed below.  There is extensive remote ischemic injury. In the posterior fossa, there have been remote infarcts of the bilateral pons and cerebellar hemispheres. There has been a large central MCA territory infarct on the left with loss of the insula and opercular cortex. An associated lacune in the left corona radiata is also a remote. Ischemic gliosis is confluent throughout the bilateral cerebral white matter.  There 2 small foci of remote hemorrhage in the right parietal cortex.  No hydrocephalus, mass lesion, or shift.  MRA HEAD FINDINGS  Posterior circulation: The vertebral arteries are balanced and small. There is a moderate to high-grade stenosis of the left V4 segment proximal to the patent PICA. Afterward, the left vertebral artery is diminutive with poor flow. The majority of the basilar flow comes from the right vertebral artery which has a hypoplastic PICA and dominant AICA. The basilar artery is hypoplastic in the setting of large posterior communicating arteries, likely exacerbated by inflow disease. On conventional imaging there is no signal change to suggest superimposed dissection. There is a high-grade stenosis of the left P1 segment, well compensated by large posterior communicating artery. Moderate multi focal atherosclerotic narrowing of the bilateral PCAs.  Anterior circulation: Notable anatomic variants include large posterior communicating arteries, hypoplastic right A1, and duplicated anterior communicating arteries.  The right cavernous carotid has atherosclerosis along its entirety, with at least moderate stenosis at its mid portion. The upper division of the right MCA has a high-grade short-segment stenosis just beyond the bifurcation.  There is atherosclerotic irregularity of the left cavernous carotid without notable narrowing. The left A1 and MCA branches are widely patent.  There is no evidence of aneurysm  (when accounting for duplicated anterior communicating artery).  Unusual prominence of right mesencephalic and prepontine vein with time-of-flight signal. Despite this, there is no distention typical of an arterial venous malformation or fistula.  IMPRESSION: 1. Multiple acute punctate infarcts in the anterior and posterior circulation. This pattern suggests a central embolic process. 2. Remote infarcts of the brainstem, bilateral cerebellum, and central left MCA territory. 3. Extensive intracranial atherosclerosis with advanced stenoses of the left V4, upper division right MCA branch, right cavernous carotid, and left P1. 4. 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   Electronically Signed   By: Tiburcio Pea M.D.   On: 06/07/2014 20:58     Today   Subjective:   Ian [name] today has no headache,no chest or abdominal pain, no events overnight.  Objective:   Blood pressure 118/62, pulse 66, temperature 98 F (36.7 C), temperature source Oral, resp. rate 20, height 5\' 9"  (1.753 m), weight 86.728 kg (191 lb 3.2 oz), SpO2 100 %.  Intake/Output Summary (Last 24 hours) at 06/11/14 1241 Last data filed at 06/11/14 0516  Gross per 24 hour  Intake    240 ml  Output   1350 ml  Net  -1110 ml    Exam Awake Alert, confused, No new F.N deficits, Normal affect [place].AT,PERRAL Supple Neck,No JVD, No cervical lymphadenopathy appriciated.  Symmetrical Chest  wall movement, Good air movement bilaterally, CTAB RRR,No Gallops,Rubs or new Murmurs, No Parasternal Heave +ve B.Sounds, Abd Soft, No tenderness, No organomegaly appriciated, No rebound - guarding or rigidity. No Cyanosis, Clubbing or edema, No new Rash or bruise   Data Review   Cultures -   CBC w Diff: Lab Results  Component Value Date   WBC 6.1 06/09/2014   HGB 13.2 06/09/2014   HCT 41.3 06/09/2014   PLT 286 06/09/2014   LYMPHOPCT 24 06/07/2014   MONOPCT 18* 06/07/2014   EOSPCT 1 06/07/2014   BASOPCT 1  06/07/2014   CMP: Lab Results  Component Value Date   NA 138 06/09/2014   K 3.7 06/09/2014   CL 105 06/09/2014   CO2 26 06/09/2014   BUN 10 06/09/2014   CREATININE 0.99 06/09/2014   PROT 7.5 06/07/2014   ALBUMIN 3.9 06/07/2014   BILITOT 0.9 06/07/2014   ALKPHOS 88 06/07/2014   AST 20 06/07/2014   ALT 11 06/07/2014  .  Micro Results No results found for this or any previous visit (from the past 240 hour(s)).   Discharge Instructions     - Recheck CBC, BMP during next visit. - Please follow on the results of 30 days heart monitor monitor, which is being arranged by [facility] care - Patient is being discharged on dual antiplatelet therapy including aspirin and Plavix, in 3 months continue only with Plavix.  Follow-up Information    Follow up with Sanda Linger, MD. Schedule an appointment as soon as possible for a visit in 1 week.   Specialty:  Internal Medicine   Contact information:   [address] [place] [address] [phone]       Follow up with Xu,Jindong, MD. Schedule an appointment as soon as possible for a visit in 2 months.   Specialty:  Neurology   Contact information:   [address] [place] [address] [phone]       Discharge Medications     Medication List    STOP taking these medications        nitroGLYCERIN 0.4 MG SL tablet  Commonly known as:  NITROSTAT      TAKE these medications        acetaminophen 325 MG tablet  Commonly known as:  TYLENOL  Take 2 tablets (650 mg total) by mouth every 6 (six) hours as needed for mild pain (or Fever >/= 101).     amLODipine 5 MG tablet  Commonly known as:  NORVASC  TAKE ONE TABLET BY MOUTH EVERY DAY     aspirin 81 MG EC tablet  Take 1 tablet (81 mg total) by mouth daily.     carvedilol 6.25 MG tablet  Commonly known as:  COREG  Take 1 tablet (6.25 mg total) by mouth 2 (two) times daily with a meal.     clopidogrel 75 MG tablet  Commonly known as:   PLAVIX  Take 1 tablet (75 mg total) by mouth daily.     FLUoxetine 20 MG tablet  Commonly known as:  PROZAC  Take 1 tablet (20 mg total) by mouth daily.     folic acid 1 MG tablet  Commonly known as:  FOLVITE  Take 1 tablet (1 mg total) by mouth daily.     hydrochlorothiazide 25 MG tablet  Commonly known as:  HYDRODIURIL  Take 1 tablet (25 mg total) by mouth daily.     lisinopril 20 MG tablet  Commonly known as:  PRINIVIL,ZESTRIL  Take 1 tablet (20 mg total) by mouth daily.     multivitamin tablet  Take 1 tablet by mouth daily.     simvastatin 40 MG tablet  Commonly known as:  ZOCOR  Take 1 tablet (40 mg total) by mouth at bedtime.     thiamine 100 MG tablet  Commonly known as:  VITAMIN B-1  Take 2 tablets (200 mg total) by mouth daily.         Total Time in preparing paper work, data evaluation and todays exam - 35 minutes  ELGERGAWY, DAWOOD M.D on 06/11/2014 at 12:41 PM  [facility] Office  [phone]

## 2014-06-11 NOTE — Clinical Social Work Placement (Signed)
Clinical Social Work Department CLINICAL SOCIAL WORK PLACEMENT NOTE 06/11/2014  Patient:  Ian [name],Ian [name]  Account Number:  [id number] Admit date:  06/07/2014  Clinical Social Worker:  Derenda FennelBASHIRA Shiara Mcgough, CLINICAL SOCIAL WORKER  Date/time:  06/10/2014 03:22 PM  Clinical Social Work is seeking post-discharge placement for this patient at the following level of care:   SKILLED NURSING   (*CSW will update this form in Epic as items are completed)   06/10/2014  [facility]/family provided with [facility] of Clinical Social Work's list of facilities offering this level of care within the geographic area requested by the patient (or if unable, by the patient's family).  06/10/2014  Patient/family informed of their freedom to choose among providers that offer the needed level of care, that participate in Medicare, Medicaid or managed care program needed by the patient, have an available bed and are willing to accept the patient.  06/10/2014  Patient/family informed of [facility]' ownership interest in [facility], as well as of the fact that they are under no obligation to receive care at this facility.  PASARR submitted to EDS on 06/10/2014 PASARR number received on 06/10/2014  FL2 transmitted to all facilities in geographic area requested by pt/family on  06/10/2014 FL2 transmitted to all facilities within larger geographic area on   Patient informed that his/her managed care company has contracts with or will negotiate with  certain facilities, including the following:   YES     Patient/family informed of bed offers received:  06/11/2014 [facility] Physician recommends and patient chooses bed at    [facility]  06/11/2014 Patient to be transferred to facility by [facility] Patient and family notified of transfer on 06/11/2014 Name of family member notified:  Pt's brother-in-law, Sam and pt's  brother, Al  The following physician request were entered in Epic:   Additional Comments:  Derenda FennelBashira Ember Henrikson, MSW, LCSWA [phone] 06/11/2014 3:25 PM

## 2014-06-11 NOTE — Discharge Instructions (Signed)
Follow with Primary MD Sanda Lingerhomas Jones, MD in 7 days   Get CBC, CMP, 2 view Chest X ray checked  by Primary MD next visit.    Activity: As tolerated with Full fall precautions use walker/cane & assistance as needed   Disposition SNF   Diet: Heart Healthy , with feeding assistance and aspiration precautions as needed.  For Heart failure patients - Check your Weight same time everyday, if you gain over 2 pounds, or you develop in leg swelling, experience more shortness of breath or chest pain, call your Primary MD immediately. Follow Cardiac Low Salt Diet and 1.8 lit/day fluid restriction.   On your next visit with your primary care physician please Get Medicines reviewed and adjusted.   Please request your Prim.MD to go over all Hospital Tests and Procedure/Radiological results at the follow up, please get all Hospital records sent to your Prim MD by signing hospital release before you go home.   If you experience worsening of your admission symptoms, develop shortness of breath, life threatening emergency, suicidal or homicidal thoughts you must seek medical attention immediately by calling 911 or calling your MD immediately  if symptoms less severe.  You Must read complete instructions/literature along with all the possible adverse reactions/side effects for all the Medicines you take and that have been prescribed to you. Take any new Medicines after you have completely understood and accpet all the possible adverse reactions/side effects.   Do not drive, operating heavy machinery, perform activities at heights, swimming or participation in water activities or provide baby sitting services if your were admitted for syncope or siezures until you have seen by Primary MD or a Neurologist and advised to do so again.  Do not drive when taking Pain medications.    Do not take more than prescribed Pain, Sleep and Anxiety Medications  Special Instructions: If you have smoked or chewed Tobacco   in the last 2 yrs please stop smoking, stop any regular Alcohol  and or any Recreational drug use.  Wear Seat belts while driving.   Please note  You were cared for by a hospitalist during your hospital stay. If you have any questions about your discharge medications or the care you received while you were in the hospital after you are discharged, you can call the unit and asked to speak with the hospitalist on call if the hospitalist that took care of you is not available. Once you are discharged, your primary care physician will handle any further medical issues. Please note that NO REFILLS for any discharge medications will be authorized once you are discharged, as it is imperative that you return to your primary care physician (or establish a relationship with a primary care physician if you do not have one) for your aftercare needs so that they can reassess your need for medications and monitor your lab values.

## 2014-06-12 ENCOUNTER — Telehealth: Payer: Self-pay | Admitting: *Deleted

## 2014-06-12 NOTE — Telephone Encounter (Signed)
Pt was on TCM list pt hasn't seen md since 2012. Spoke with Vicki MalletGayle Miller at # on file [facility]Raechel Chute/lmb

## 2014-06-13 DIAGNOSIS — N400 Enlarged prostate without lower urinary tract symptoms: Secondary | ICD-10-CM | POA: Diagnosis not present

## 2014-06-13 DIAGNOSIS — I639 Cerebral infarction, unspecified: Secondary | ICD-10-CM | POA: Diagnosis not present

## 2014-06-13 DIAGNOSIS — F0391 Unspecified dementia with behavioral disturbance: Secondary | ICD-10-CM | POA: Diagnosis not present

## 2014-06-13 DIAGNOSIS — I10 Essential (primary) hypertension: Secondary | ICD-10-CM | POA: Diagnosis not present

## 2014-06-13 DIAGNOSIS — G9340 Encephalopathy, unspecified: Secondary | ICD-10-CM | POA: Diagnosis not present

## 2014-07-04 ENCOUNTER — Emergency Department (HOSPITAL_COMMUNITY): Payer: Medicare Other

## 2014-07-04 ENCOUNTER — Inpatient Hospital Stay (HOSPITAL_COMMUNITY)
Admission: EM | Admit: 2014-07-04 | Discharge: 2014-07-08 | DRG: 684 | Disposition: A | Discharge status: Skilled Nursing Facility | Payer: Medicare Other | Attending: Internal Medicine | Admitting: Internal Medicine

## 2014-07-04 ENCOUNTER — Encounter (HOSPITAL_COMMUNITY): Payer: Self-pay

## 2014-07-04 DIAGNOSIS — R05 Cough: Secondary | ICD-10-CM | POA: Diagnosis not present

## 2014-07-04 DIAGNOSIS — I2510 Atherosclerotic heart disease of native coronary artery without angina pectoris: Secondary | ICD-10-CM | POA: Diagnosis present

## 2014-07-04 DIAGNOSIS — E869 Volume depletion, unspecified: Secondary | ICD-10-CM

## 2014-07-04 DIAGNOSIS — Z7902 Long term (current) use of antithrombotics/antiplatelets: Secondary | ICD-10-CM | POA: Diagnosis not present

## 2014-07-04 DIAGNOSIS — Z8673 Personal history of transient ischemic attack (TIA), and cerebral infarction without residual deficits: Secondary | ICD-10-CM | POA: Diagnosis not present

## 2014-07-04 DIAGNOSIS — Y92239 Unspecified place in hospital as the place of occurrence of the external cause: Secondary | ICD-10-CM

## 2014-07-04 DIAGNOSIS — W19XXXA Unspecified fall, initial encounter: Secondary | ICD-10-CM | POA: Diagnosis not present

## 2014-07-04 DIAGNOSIS — F1020 Alcohol dependence, uncomplicated: Secondary | ICD-10-CM | POA: Diagnosis present

## 2014-07-04 DIAGNOSIS — R001 Bradycardia, unspecified: Secondary | ICD-10-CM | POA: Diagnosis not present

## 2014-07-04 DIAGNOSIS — R55 Syncope and collapse: Secondary | ICD-10-CM

## 2014-07-04 DIAGNOSIS — F17210 Nicotine dependence, cigarettes, uncomplicated: Secondary | ICD-10-CM | POA: Diagnosis present

## 2014-07-04 DIAGNOSIS — Z7982 Long term (current) use of aspirin: Secondary | ICD-10-CM | POA: Diagnosis not present

## 2014-07-04 DIAGNOSIS — R1311 Dysphagia, oral phase: Secondary | ICD-10-CM | POA: Diagnosis not present

## 2014-07-04 DIAGNOSIS — F0390 Unspecified dementia without behavioral disturbance: Secondary | ICD-10-CM | POA: Diagnosis present

## 2014-07-04 DIAGNOSIS — I679 Cerebrovascular disease, unspecified: Secondary | ICD-10-CM | POA: Diagnosis not present

## 2014-07-04 DIAGNOSIS — E785 Hyperlipidemia, unspecified: Secondary | ICD-10-CM | POA: Diagnosis present

## 2014-07-04 DIAGNOSIS — Z79899 Other long term (current) drug therapy: Secondary | ICD-10-CM | POA: Diagnosis not present

## 2014-07-04 DIAGNOSIS — E860 Dehydration: Secondary | ICD-10-CM | POA: Diagnosis not present

## 2014-07-04 DIAGNOSIS — N179 Acute kidney failure, unspecified: Secondary | ICD-10-CM | POA: Diagnosis not present

## 2014-07-04 DIAGNOSIS — I639 Cerebral infarction, unspecified: Secondary | ICD-10-CM | POA: Diagnosis not present

## 2014-07-04 DIAGNOSIS — N400 Enlarged prostate without lower urinary tract symptoms: Secondary | ICD-10-CM | POA: Diagnosis present

## 2014-07-04 DIAGNOSIS — E875 Hyperkalemia: Secondary | ICD-10-CM | POA: Diagnosis not present

## 2014-07-04 DIAGNOSIS — J40 Bronchitis, not specified as acute or chronic: Secondary | ICD-10-CM | POA: Diagnosis not present

## 2014-07-04 DIAGNOSIS — Z8249 Family history of ischemic heart disease and other diseases of the circulatory system: Secondary | ICD-10-CM | POA: Diagnosis not present

## 2014-07-04 DIAGNOSIS — G459 Transient cerebral ischemic attack, unspecified: Secondary | ICD-10-CM | POA: Diagnosis not present

## 2014-07-04 DIAGNOSIS — R8290 Unspecified abnormal findings in urine: Secondary | ICD-10-CM | POA: Diagnosis present

## 2014-07-04 DIAGNOSIS — I10 Essential (primary) hypertension: Secondary | ICD-10-CM | POA: Diagnosis not present

## 2014-07-04 DIAGNOSIS — R40242 Glasgow coma scale score 9-12: Secondary | ICD-10-CM | POA: Diagnosis not present

## 2014-07-04 DIAGNOSIS — R488 Other symbolic dysfunctions: Secondary | ICD-10-CM | POA: Diagnosis not present

## 2014-07-04 DIAGNOSIS — Z951 Presence of aortocoronary bypass graft: Secondary | ICD-10-CM | POA: Diagnosis not present

## 2014-07-04 DIAGNOSIS — R41841 Cognitive communication deficit: Secondary | ICD-10-CM | POA: Diagnosis not present

## 2014-07-04 DIAGNOSIS — R278 Other lack of coordination: Secondary | ICD-10-CM | POA: Diagnosis not present

## 2014-07-04 DIAGNOSIS — I951 Orthostatic hypotension: Secondary | ICD-10-CM | POA: Diagnosis present

## 2014-07-04 DIAGNOSIS — M6281 Muscle weakness (generalized): Secondary | ICD-10-CM | POA: Diagnosis not present

## 2014-07-04 DIAGNOSIS — R262 Difficulty in walking, not elsewhere classified: Secondary | ICD-10-CM | POA: Diagnosis not present

## 2014-07-04 DIAGNOSIS — R079 Chest pain, unspecified: Secondary | ICD-10-CM | POA: Diagnosis not present

## 2014-07-04 DIAGNOSIS — R531 Weakness: Secondary | ICD-10-CM | POA: Diagnosis not present

## 2014-07-04 DIAGNOSIS — I6789 Other cerebrovascular disease: Secondary | ICD-10-CM | POA: Diagnosis not present

## 2014-07-04 LAB — CBC WITH DIFFERENTIAL/PLATELET
Basophils Absolute: 0.1 10*3/uL (ref 0.0–0.1)
Basophils Relative: 1 % (ref 0–1)
Eosinophils Absolute: 0.1 10*3/uL (ref 0.0–0.7)
Eosinophils Relative: 2 % (ref 0–5)
HCT: 40 % (ref 39.0–52.0)
Hemoglobin: 13.1 g/dL (ref 13.0–17.0)
Lymphocytes Relative: 31 % (ref 12–46)
Lymphs Abs: 1.6 10*3/uL (ref 0.7–4.0)
MCH: 22.9 pg — ABNORMAL LOW (ref 26.0–34.0)
MCHC: 32.8 g/dL (ref 30.0–36.0)
MCV: 69.8 fL — ABNORMAL LOW (ref 78.0–100.0)
Monocytes Absolute: 0.9 10*3/uL (ref 0.1–1.0)
Monocytes Relative: 17 % — ABNORMAL HIGH (ref 3–12)
Neutro Abs: 2.4 10*3/uL (ref 1.7–7.7)
Neutrophils Relative %: 49 % (ref 43–77)
Platelets: 207 10*3/uL (ref 150–400)
RBC: 5.73 MIL/uL (ref 4.22–5.81)
RDW: 14.8 % (ref 11.5–15.5)
WBC: 5.1 10*3/uL (ref 4.0–10.5)

## 2014-07-04 LAB — URINE MICROSCOPIC-ADD ON

## 2014-07-04 LAB — BASIC METABOLIC PANEL
Anion gap: 9 (ref 5–15)
BUN: 55 mg/dL — ABNORMAL HIGH (ref 6–23)
CO2: 15 mmol/L — ABNORMAL LOW (ref 19–32)
Calcium: 7.3 mg/dL — ABNORMAL LOW (ref 8.4–10.5)
Chloride: 110 mmol/L (ref 96–112)
Creatinine, Ser: 2.33 mg/dL — ABNORMAL HIGH (ref 0.50–1.35)
GFR calc Af Amer: 30 mL/min — ABNORMAL LOW (ref >90)
GFR calc non Af Amer: 26 mL/min — ABNORMAL LOW (ref >90)
Glucose, Bld: 86 mg/dL (ref 70–99)
Potassium: 5.1 mmol/L (ref 3.5–5.1)
Sodium: 134 mmol/L — ABNORMAL LOW (ref 135–145)

## 2014-07-04 LAB — URINALYSIS, ROUTINE W REFLEX MICROSCOPIC
Bilirubin Urine: NEGATIVE
Glucose, UA: NEGATIVE mg/dL
Ketones, ur: NEGATIVE mg/dL
Nitrite: NEGATIVE
Protein, ur: NEGATIVE mg/dL
Specific Gravity, Urine: 1.013 (ref 1.005–1.030)
Urobilinogen, UA: 0.2 mg/dL (ref 0.0–1.0)
pH: 5 (ref 5.0–8.0)

## 2014-07-04 LAB — TROPONIN I: Troponin I: 0.09 ng/mL — ABNORMAL HIGH (ref <0.031)

## 2014-07-04 MED ORDER — ALUM & MAG HYDROXIDE-SIMETH 200-200-20 MG/5ML PO SUSP: 30.0000 mL | Freq: Four times a day (QID) | ORAL | Status: DC | PRN

## 2014-07-04 MED ORDER — ASPIRIN 81 MG PO CHEW
324.0000 mg | CHEWABLE_TABLET | Freq: Once | ORAL | Status: DC
  Filled 2014-07-04: qty 4

## 2014-07-04 MED ORDER — SIMVASTATIN 40 MG PO TABS
40.0000 mg | ORAL_TABLET | Freq: Every day | ORAL | Status: DC
  Administered 2014-07-04 – 2014-07-07 (×4): 40 mg via ORAL
  Filled 2014-07-04 (×5): qty 1

## 2014-07-04 MED ORDER — VITAMIN B-1 100 MG PO TABS
200.0000 mg | ORAL_TABLET | Freq: Every day | ORAL | Status: DC
  Administered 2014-07-05 – 2014-07-08 (×4): 200 mg via ORAL
  Filled 2014-07-04 (×4): qty 2

## 2014-07-04 MED ORDER — HEPARIN SODIUM (PORCINE) 5000 UNIT/ML IJ SOLN
5000.0000 [IU] | Freq: Three times a day (TID) | INTRAMUSCULAR | Status: DC
  Administered 2014-07-04 – 2014-07-08 (×11): 5000 [IU] via SUBCUTANEOUS
  Filled 2014-07-04 (×14): qty 1

## 2014-07-04 MED ORDER — SODIUM CHLORIDE 0.9 % IV BOLUS (SEPSIS)
1000.0000 mL | Freq: Once | INTRAVENOUS | Status: AC
Start: 2014-07-04 — End: 2014-07-04
  Administered 2014-07-04: 1000 mL via INTRAVENOUS

## 2014-07-04 MED ORDER — SODIUM CHLORIDE 0.9 % IJ SOLN
3.0000 mL | Freq: Two times a day (BID) | INTRAMUSCULAR | Status: DC
  Administered 2014-07-04 – 2014-07-08 (×6): 3 mL via INTRAVENOUS

## 2014-07-04 MED ORDER — SODIUM CHLORIDE 0.9 % IV SOLN
INTRAVENOUS | Status: DC
  Administered 2014-07-04: 19:00:00 via INTRAVENOUS

## 2014-07-04 MED ORDER — ASPIRIN EC 81 MG PO TBEC
81.0000 mg | DELAYED_RELEASE_TABLET | Freq: Every day | ORAL | Status: DC
  Administered 2014-07-05 – 2014-07-08 (×4): 81 mg via ORAL
  Filled 2014-07-04 (×4): qty 1

## 2014-07-04 MED ORDER — SODIUM CHLORIDE 0.9 % IV SOLN
INTRAVENOUS | Status: DC
  Administered 2014-07-04 – 2014-07-05 (×2): 100 mL/h via INTRAVENOUS

## 2014-07-04 MED ORDER — ONDANSETRON HCL 4 MG/2ML IJ SOLN: 4.0000 mg | Freq: Four times a day (QID) | INTRAMUSCULAR | Status: DC | PRN

## 2014-07-04 MED ORDER — CLOPIDOGREL BISULFATE 75 MG PO TABS
75.0000 mg | ORAL_TABLET | Freq: Every day | ORAL | Status: DC
  Administered 2014-07-05 – 2014-07-08 (×4): 75 mg via ORAL
  Filled 2014-07-04 (×4): qty 1

## 2014-07-04 MED ORDER — THERA VITAL M PO TABS: 1.0000 | ORAL_TABLET | Freq: Every day | ORAL | Status: DC

## 2014-07-04 MED ORDER — ASPIRIN 300 MG RE SUPP
300.0000 mg | Freq: Once | RECTAL | Status: AC
  Administered 2014-07-04: 300 mg via RECTAL
  Filled 2014-07-04: qty 1

## 2014-07-04 MED ORDER — ACETAMINOPHEN 325 MG PO TABS: 650.0000 mg | ORAL_TABLET | Freq: Four times a day (QID) | ORAL | Status: DC | PRN

## 2014-07-04 MED ORDER — FOLIC ACID 1 MG PO TABS
1.0000 mg | ORAL_TABLET | Freq: Every day | ORAL | Status: DC
  Administered 2014-07-05 – 2014-07-08 (×4): 1 mg via ORAL
  Filled 2014-07-04 (×4): qty 1

## 2014-07-04 MED ORDER — ACETAMINOPHEN 650 MG RE SUPP: 650.0000 mg | Freq: Four times a day (QID) | RECTAL | Status: DC | PRN

## 2014-07-04 MED ORDER — ADULT MULTIVITAMIN W/MINERALS CH
1.0000 | ORAL_TABLET | Freq: Every day | ORAL | Status: DC
  Administered 2014-07-06 – 2014-07-08 (×3): 1 via ORAL
  Filled 2014-07-04 (×4): qty 1

## 2014-07-04 MED ORDER — ONDANSETRON HCL 4 MG PO TABS: 4.0000 mg | ORAL_TABLET | Freq: Four times a day (QID) | ORAL | Status: DC | PRN

## 2014-07-04 NOTE — H&P (Signed)
[facility] History and Physical                                                                                    Ian FlockDavid [name], is a 74 y.o. male  MRN: [id number]   DOB - 10/01/1940  Admit Date - 07/04/2014  Outpatient Primary MD for the patient is No primary care provider on file.  With History of -  Past Medical History  Diagnosis Date  . CAD (coronary artery disease)     LIMA to OM2, SVG to OM1, SVG to RCA  . HLD (hyperlipidemia)   . HTN (hypertension)   . BPH (benign prostatic hyperplasia)   . Stroke       Past Surgical History  Procedure Laterality Date  . Coronary artery bypass graft  2001  . Hernia repair      in for   Chief Complaint  Patient presents with  . Hypotension  . Loss of Consciousness     HPI Ian [name]  is a 74 y.o. male, who was recently discharged to rehabilitation and subsequently to skilled nursing facility after treatment for acute encephalopathy and a cerebrovascular accident. Prior to that admission patient had been living at home and apparently had been experiencing issues related to alcohol abuse. 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 During that hospitalization he was started on dual antiplatelet therapy with aspirin and Plavix to continue for 3 months and then continue with Plavix only after that time. Since discharge to the skilled nursing facility (according to the patient's brother who is at the bedside) the patient has been doing relatively well. He is been working with PT. Today while working with PT patient apparently was asymptomatic during the therapy session but once he was walked back to his room he had a syncopal episode 2 occasions. EMS was called. He was found to be hypotensive with a blood pressure 77/55. His CBG was stable at 108. Patient was given a 500 mL fluid challenge in the field.  Upon arrival to the emergency department his blood pressure was 91/53  his heart rate was 52. His BUN was 55 with a creatinine of 2.33 noting at time of discharge his BUN was 10 and his creatinine was 0.99. His troponin today was slightly elevated at 0.09. Chest x-ray was unremarkable. Telemetry revealed sinus bradycardia. EKG revealed sinus bradycardia with a heart rate of 51, QTC 403 ms and slightly spiked T waves. When questioning the patient has no specific complaints. He seems to be somewhat of a poor historian and this is likely related to his recent CVA. He was able to tell me that his legs do ache somewhat with walking on a regular basis. Brother is at the bedside and states that the patient appears to be improving from a mentation standpoint when compared to recent hospitalization.  Review of Systems   In addition to the HPI above,  No Fever-chills, myalgias or other constitutional symptoms No Headache, changes with Vision or hearing, new weakness, tingling, numbness in any extremity, No problems swallowing food or Liquids, indigestion/reflux No Chest pain, Cough or Shortness of Breath,  palpitations, orthopnea or DOE-patient did have witnessed syncopal episode 2 with associated hypotension prior to arrival No Abdominal pain, N/V; no melena or hematochezia, no dark tarry stools, Bowel movements are regular, No dysuria, hematuria or flank pain No new skin rashes, lesions, masses or bruises, No new joints pains, endorses legs are achy when ambulating and question if this is claudication No recent weight gain or loss No polyuria, polydypsia or polyphagia,  *A full 10 point Review of Systems was done, except as stated above, all other Review of Systems were negative.  Social History History  Substance Use Topics  . Smoking status: Current Some Day Smoker -- 0.50 packs/day for 40 years    Types: Cigarettes  . Smokeless tobacco: Never Used  . Alcohol Use: 3.0 oz/week    5 Shots of liquor per week    Family History Family History  Problem Relation Age of  Onset  . Arthritis    . Hypertension    . Stroke    . Heart attack Mother   . Heart attack Father     Prior to Admission medications   Medication Sig Start Date End Date Taking? Authorizing Provider  acetaminophen (TYLENOL) 325 MG tablet Take 2 tablets (650 mg total) by mouth every 6 (six) hours as needed for mild pain (or Fever >/= 101). 06/11/14  Yes Dawood Elgergawy, MD  amLODipine (NORVASC) 5 MG tablet TAKE ONE TABLET BY MOUTH EVERY DAY 09/20/11  Yes Kathleene Hazel, MD  aspirin EC 81 MG EC tablet Take 1 tablet (81 mg total) by mouth daily. 06/11/14  Yes Huey Bienenstock, MD  carvedilol (COREG) 6.25 MG tablet Take 1 tablet (6.25 mg total) by mouth 2 (two) times daily with a meal. 08/17/10  Yes Etta Grandchild, MD  clopidogrel (PLAVIX) 75 MG tablet Take 1 tablet (75 mg total) by mouth daily. 06/11/14  Yes Dawood Elgergawy, MD  FLUoxetine (PROZAC) 20 MG tablet Take 1 tablet (20 mg total) by mouth daily. 08/17/10  Yes Etta Grandchild, MD  folic acid (FOLVITE) 1 MG tablet Take 1 tablet (1 mg total) by mouth daily. 06/11/14  Yes Huey Bienenstock, MD  hydrochlorothiazide 25 MG tablet Take 1 tablet (25 mg total) by mouth daily. 08/17/10  Yes Etta Grandchild, MD  lisinopril (PRINIVIL,ZESTRIL) 20 MG tablet Take 1 tablet (20 mg total) by mouth daily. 08/17/10  Yes Etta Grandchild, MD  Multiple Vitamins-Minerals (MULTIVITAMIN) tablet Take 1 tablet by mouth daily. 06/11/14  Yes Dawood Elgergawy, MD  simvastatin (ZOCOR) 40 MG tablet Take 1 tablet (40 mg total) by mouth at bedtime. 08/17/10  Yes Etta Grandchild, MD  thiamine (VITAMIN B-1) 100 MG tablet Take 2 tablets (200 mg total) by mouth daily. 06/11/14  Yes Huey Bienenstock, MD    No Known Allergies  Physical Exam  Vitals  Blood pressure 106/72, pulse 50, temperature 98 F (36.7 C), temperature source Rectal, resp. rate 17, SpO2 100 %.   General:  In no acute distress, appears healthy and well nourished  Psych:  Flat affect, in no apparent Suicidal or  Homicidal ideations, Awake Alert, Oriented X name only. Appears to have short-term memory deficits and some difficulty formulating words.  Neuro:   No focal neurological deficits, CN II through XII intact, Strength 5/5 all 4 extremities, Sensation intact all 4 extremities.  ENT:  Ears and Eyes appear Normal, Conjunctivae clear, PER. Moist oral mucosa without erythema or exudates.  Neck:  Supple, No lymphadenopathy appreciated  Respiratory:  Somewhat  diminished left side posteriorly, coarse rhonchi noted entire right side posteriorly, also upper airway congestion that clears briefly with coughing, room air with saturations 100%  Cardiac:  RRR, No Murmurs, no LE edema noted, no JVD, No carotid bruits, peripheral pulses palpable at 2+, bradycardia heart rate 50-53  Abdomen:  Positive bowel sounds, Soft, Non tender, Non distended,  No masses appreciated, no obvious hepatosplenomegaly  Skin:  No Cyanosis, Normal Skin Turgor, No Skin Rash or Bruise.  Extremities: Symmetrical without obvious trauma or injury,  no effusions.  Data Review  CBC  Recent Labs Lab 07/04/14 1400  WBC 5.1  HGB 13.1  HCT 40.0  PLT 207  MCV 69.8*  MCH 22.9*  MCHC 32.8  RDW 14.8  LYMPHSABS 1.6  MONOABS 0.9  EOSABS 0.1  BASOSABS 0.1    Chemistries   Recent Labs Lab 07/04/14 1400  NA 134*  K 5.1  CL 110  CO2 15*  GLUCOSE 86  BUN 55*  CREATININE 2.33*  CALCIUM 7.3*    CrCl cannot be calculated (Unknown ideal weight.).  No results for input(s): TSH, T4TOTAL, T3FREE, THYROIDAB in the last 72 hours.  Invalid input(s): FREET3  Coagulation profile No results for input(s): INR, PROTIME in the last 168 hours.  No results for input(s): DDIMER in the last 72 hours.  Cardiac Enzymes  Recent Labs Lab 07/04/14 1400  TROPONINI 0.09*    Invalid input(s): POCBNP  Urinalysis    Component Value Date/Time   COLORURINE YELLOW 07/04/2014 1607   APPEARANCEUR CLEAR 07/04/2014 1607   LABSPEC 1.013  07/04/2014 1607   PHURINE 5.0 07/04/2014 1607   GLUCOSEU NEGATIVE 07/04/2014 1607   GLUCOSEU NEGATIVE 07/17/2010 1401   HGBUR SMALL* 07/04/2014 1607   BILIRUBINUR NEGATIVE 07/04/2014 1607   KETONESUR NEGATIVE 07/04/2014 1607   PROTEINUR NEGATIVE 07/04/2014 1607   UROBILINOGEN 0.2 07/04/2014 1607   NITRITE NEGATIVE 07/04/2014 1607   LEUKOCYTESUR TRACE* 07/04/2014 1607    Imaging results:   X-ray Chest Pa And Lateral  06/08/2014   CLINICAL DATA:  Acute encephalopathy  EXAM: CHEST  2 VIEW  COMPARISON:  06/07/2014  FINDINGS: Cardiac shadow is stable. Postsurgical changes are again noted. The lungs are clear. No focal infiltrate or sizable effusion is seen. No bony abnormality is noted.  IMPRESSION: No acute abnormality noted.   Electronically Signed   By: Alcide Clever M.D.   On: 06/08/2014 09:27   Dg Chest 2 View  06/07/2014   CLINICAL DATA:  Altered mental status, failure to thrive  EXAM: CHEST  2 VIEW  COMPARISON:  None.  FINDINGS: The cardiac shadow is within normal limits. Postsurgical changes are noted. The lungs are well aerated bilaterally without focal infiltrate or sizable effusion. A few scattered calcifications are noted likely related to prior granulomatous disease. No acute bony abnormality is seen.  IMPRESSION: No acute abnormality noted.   Electronically Signed   By: Alcide Clever M.D.   On: 06/07/2014 15:11   Ct Head Wo Contrast  07/04/2014   CLINICAL DATA:  Syncopal episode today after physical therapy, history of recent stroke with vertebral artery occlusion, hurts all over  EXAM: CT HEAD WITHOUT CONTRAST  TECHNIQUE: Contiguous axial images were obtained from the base of the skull through the vertex without intravenous contrast.  COMPARISON:  06/07/2014  FINDINGS: The bony calvarium is intact. No gross soft tissue abnormality is noted. Chronic atrophic changes and chronic white matter ischemic change is seen. There are changes consistent with prior lacunar infarct in the  left  centrum semiovale. No acute ischemic changes are seen.  IMPRESSION: Chronic atrophic and ischemic changes.  No acute abnormality noted.   Electronically Signed   By: Alcide Clever M.D.   On: 07/04/2014 15:12   Ct Head Wo Contrast  06/07/2014   CLINICAL DATA:  74 year old male with failure to thrive. Confusion and altered mentation  EXAM: CT HEAD WITHOUT CONTRAST  TECHNIQUE: Contiguous axial images were obtained from the base of the skull through the vertex without intravenous contrast.  COMPARISON:  None.  FINDINGS: Low attenuation within the subcortical and periventricular white matter is identified compatible with chronic small vessel ischemic disease. Focal area of low attenuation within the left centrum semiovale is identified consistent with subacute to chronic lacunar infarct, image number 19/series 201. Prominence of the sulci and ventricles are identified consistent with brain atrophy. No evidence for acute brain infarct, hemorrhage or mass. The paranasal sinuses are clear. The mastoid air cells are also clear.  IMPRESSION: 1. Small vessel ischemic disease and brain atrophy. 2. 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   Electronically Signed   By: Signa Kell M.D.   On: 06/07/2014 15:02   Ct Angio Neck W/cm &/or Wo/cm  06/09/2014   CLINICAL DATA:  Initial evaluation for stroke.  EXAM: CT ANGIOGRAPHY NECK  TECHNIQUE: Multidetector CT imaging of the neck was performed using the standard protocol during bolus administration of intravenous contrast. Multiplanar CT image reconstructions and MIPs were obtained to evaluate the vascular anatomy. Carotid stenosis measurements (when applicable) are obtained utilizing NASCET criteria, using the distal internal carotid diameter as the denominator.  CONTRAST:  50 cc of OMNIPAQUE IOHEXOL 350 MG/ML SOLN  COMPARISON:  Prior MRI from 06/07/2014.  FINDINGS: Aortic arch: Visualize aortic arch is of normal caliber with normal 3 vessel morphology.  Moderate atheromatous plaque present within the aortic arch itself. There is noncalcified atheromatous plaque at the origin of the great vessels without associated hemodynamically significant stenosis. Subclavian arteries widely patent bilaterally. Incidental note made of a right-sided central venous catheter with tip terminating in the distal SVC.  Right carotid system: The right common carotid artery is well opacified to the level of the carotid bifurcation. Calcified and noncalcified plaque at the carotid bifurcation/proximal right internal carotid artery present with resultant stenosis of approximately 40% by NASCET criteria. Distally, the right internal carotid artery is tortuous but well opacified to the level of the skullbase. Prominent atheromatous plaque present within the visualized cavernous right ICA.  Right external carotid arteries and its branch vessels grossly normal.  Left carotid system: Left common carotid artery well opacified to the level of the carotid bifurcation. Minimal atheromatous plaque present about the left carotid bifurcation and proximal left internal carotid artery without significant stenosis. Left ICA is mildly tortuous with medialization into the retropharyngeal space. No hemodynamically significant stenosis, dissection, or vascular occlusion identified within left ICA.  Left external carotid artery and its branches within normal limits.  Vertebral arteries:Both vertebral arteries appear occluded from their origin from the subclavian arteries. There is some distal reconstitution via muscular branches at the level of C2-3 (series 401, image 90). The reconstituted vertebral arteries are markedly small and attenuated in appearance. Moderate to high-grade stenosis of the left V4 segment proximal to the left posterior inferior cerebral artery again noted. Distally, the left vertebral artery is diminutive with poor flow.  Visualized lungs are clear. No soft tissue abnormality within the  neck. Thyroid gland is normal. No adenopathy.  Advanced multilevel  degenerative disc disease noted within the visualized cervical spine. No acute osseus abnormality. No worrisome lytic or blastic osseous lesions.  IMPRESSION: 1. 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 There is some distal reconstitution of the V2 segments at the level of C2-3 via muscular branches. Distally, the vertebral arteries are diminutive in appearance. 2. Moderate to high-grade stenosis within the distal left V4 segment, similar as described on previous MRA. 3. Calcified plaque at the right carotid bifurcation/proximal right ICA with associated short segment stenosis of approximately 40% by NASCET criteria. 4. No other hemodynamically significant stenosis identified within the neck.   Electronically Signed   By: Rise Mu M.D.   On: 06/09/2014 00:32   Mr Brain Wo Contrast  06/07/2014   CLINICAL DATA:  Acute encephalopathy.  EXAM: MRI HEAD WITHOUT CONTRAST  MRA HEAD WITHOUT CONTRAST  TECHNIQUE: Multiplanar, multiecho pulse sequences of the brain and surrounding structures were obtained without intravenous contrast. Angiographic images of the head were obtained using MRA technique without contrast.  COMPARISON:  Head CT from earlier the same day  FINDINGS: MRI HEAD FINDINGS  Calvarium and upper cervical spine: No focal marrow signal abnormality.  The posterior ring of C1 is hypoplastic, resulting in deformity of the posterior spinal cord, similar to increased from 2008. Degenerative endplate ridging C3-4 also deforms the ventral cord at this level.  Orbits: No significant findings.  Sinuses: Clear. Mastoid and middle ears are clear.  Brain: Numerous punctate acute infarcts in the infra and supratentorial brain. Infratentorially, the infarcts are distributed throughout the bilateral cerebellum, up to 9 mm in diameter on the right. There could be a punctate infarct in the left upper pons, but  small size limits certainty. The cerebral infarcts are clustered in the occipital and parietal cortex, although small infarcts are present in the bilateral frontal lobes. No hemorrhagic conversion. Arterial findings discussed below.  There is extensive remote ischemic injury. In the posterior fossa, there have been remote infarcts of the bilateral pons and cerebellar hemispheres. There has been a large central MCA territory infarct on the left with loss of the insula and opercular cortex. An associated lacune in the left corona radiata is also a remote. Ischemic gliosis is confluent throughout the bilateral cerebral white matter.  There 2 small foci of remote hemorrhage in the right parietal cortex.  No hydrocephalus, mass lesion, or shift.  MRA HEAD FINDINGS  Posterior circulation: The vertebral arteries are balanced and small. There is a moderate to high-grade stenosis of the left V4 segment proximal to the patent PICA. Afterward, the left vertebral artery is diminutive with poor flow. The majority of the basilar flow comes from the right vertebral artery which has a hypoplastic PICA and dominant AICA. The basilar artery is hypoplastic in the setting of large posterior communicating arteries, likely exacerbated by inflow disease. On conventional imaging there is no signal change to suggest superimposed dissection. There is a high-grade stenosis of the left P1 segment, well compensated by large posterior communicating artery. Moderate multi focal atherosclerotic narrowing of the bilateral PCAs.  Anterior circulation: Notable anatomic variants include large posterior communicating arteries, hypoplastic right A1, and duplicated anterior communicating arteries.  The right cavernous carotid has atherosclerosis along its entirety, with at least moderate stenosis at its mid portion. The upper division of the right MCA has a high-grade short-segment stenosis just beyond the bifurcation.  There is atherosclerotic  irregularity of the left cavernous carotid without notable narrowing. The left A1  and MCA branches are widely patent.  There is no evidence of aneurysm (when accounting for duplicated anterior communicating artery).  Unusual prominence of right mesencephalic and prepontine vein with time-of-flight signal. Despite this, there is no distention typical of an arterial venous malformation or fistula.  IMPRESSION: 1. Multiple acute punctate infarcts in the anterior and posterior circulation. This pattern suggests a central embolic process. 2. Remote infarcts of the brainstem, bilateral cerebellum, and central left MCA territory. 3. Extensive intracranial atherosclerosis with advanced stenoses of the left V4, upper division right MCA branch, right cavernous carotid, and left P1. 4. 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   Electronically Signed   By: Tiburcio Pea M.D.   On: 06/07/2014 20:58   Dg Chest Portable 1 View  07/04/2014   CLINICAL DATA:  Hypotension, weakness and chest pain today. Questionable loss of consciousness today. History of hypertension and CABG surgery.  EXAM: PORTABLE CHEST - 1 VIEW  COMPARISON:  06/08/2014  FINDINGS: Changes from CABG surgery are stable. Cardiac silhouette is mildly enlarged. No mediastinal or hilar masses or evidence of adenopathy.  Clear lungs.  No pleural effusion or pneumothorax.  Bony thorax is intact.  IMPRESSION: No acute cardiopulmonary disease.   Electronically Signed   By: Amie Portland M.D.   On: 07/04/2014 16:04   Mr Maxine Glenn Head/brain Wo Cm  06/07/2014   CLINICAL DATA:  Acute encephalopathy.  EXAM: MRI HEAD WITHOUT CONTRAST  MRA HEAD WITHOUT CONTRAST  TECHNIQUE: Multiplanar, multiecho pulse sequences of the brain and surrounding structures were obtained without intravenous contrast. Angiographic images of the head were obtained using MRA technique without contrast.  COMPARISON:  Head CT from earlier the same day  FINDINGS: MRI HEAD  FINDINGS  Calvarium and upper cervical spine: No focal marrow signal abnormality.  The posterior ring of C1 is hypoplastic, resulting in deformity of the posterior spinal cord, similar to increased from 2008. Degenerative endplate ridging C3-4 also deforms the ventral cord at this level.  Orbits: No significant findings.  Sinuses: Clear. Mastoid and middle ears are clear.  Brain: Numerous punctate acute infarcts in the infra and supratentorial brain. Infratentorially, the infarcts are distributed throughout the bilateral cerebellum, up to 9 mm in diameter on the right. There could be a punctate infarct in the left upper pons, but small size limits certainty. The cerebral infarcts are clustered in the occipital and parietal cortex, although small infarcts are present in the bilateral frontal lobes. No hemorrhagic conversion. Arterial findings discussed below.  There is extensive remote ischemic injury. In the posterior fossa, there have been remote infarcts of the bilateral pons and cerebellar hemispheres. There has been a large central MCA territory infarct on the left with loss of the insula and opercular cortex. An associated lacune in the left corona radiata is also a remote. Ischemic gliosis is confluent throughout the bilateral cerebral white matter.  There 2 small foci of remote hemorrhage in the right parietal cortex.  No hydrocephalus, mass lesion, or shift.  MRA HEAD FINDINGS  Posterior circulation: The vertebral arteries are balanced and small. There is a moderate to high-grade stenosis of the left V4 segment proximal to the patent PICA. Afterward, the left vertebral artery is diminutive with poor flow. The majority of the basilar flow comes from the right vertebral artery which has a hypoplastic PICA and dominant AICA. The basilar artery is hypoplastic in the setting of large posterior communicating arteries, likely exacerbated by inflow disease. On conventional imaging there  is no signal change to  suggest superimposed dissection. There is a high-grade stenosis of the left P1 segment, well compensated by large posterior communicating artery. Moderate multi focal atherosclerotic narrowing of the bilateral PCAs.  Anterior circulation: Notable anatomic variants include large posterior communicating arteries, hypoplastic right A1, and duplicated anterior communicating arteries.  The right cavernous carotid has atherosclerosis along its entirety, with at least moderate stenosis at its mid portion. The upper division of the right MCA has a high-grade short-segment stenosis just beyond the bifurcation.  There is atherosclerotic irregularity of the left cavernous carotid without notable narrowing. The left A1 and MCA branches are widely patent.  There is no evidence of aneurysm (when accounting for duplicated anterior communicating artery).  Unusual prominence of right mesencephalic and prepontine vein with time-of-flight signal. Despite this, there is no distention typical of an arterial venous malformation or fistula.  IMPRESSION: 1. Multiple acute punctate infarcts in the anterior and posterior circulation. This pattern suggests a central embolic process. 2. Remote infarcts of the brainstem, bilateral cerebellum, and central left MCA territory. 3. Extensive intracranial atherosclerosis with advanced stenoses of the left V4, upper division right MCA branch, right cavernous carotid, and left P1. 4. 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   Electronically Signed   By: Tiburcio Pea M.D.   On: 06/07/2014 20:58     EKG: Sinus bradycardia without any acute ischemic changes, slightly peaked T waves   Assessment & Plan  Principal Problem:   Syncope and collapse -Admit to telemetry -Suspect related to orthostasis in setting of dehydration and effects of antihypertensive medications -Although bradycardic, unclear if this contributing therefore we'll monitor activity while on  telemetry -PT/OT evaluation -Hold offending medications -IV fluids -Orthostatic vital signs in a.m.  Active Problems:   Acute renal failure/ Dehydration -Was on thiazide diuretic and ACE inhibitor prior to admission so we'll discontinue -Also had episode of hypotension so likely hypoperfusion also contributing to acute renal failure  -Baseline renal function 10/0.99 -Current renal function 55/2.33 -IV fluid hydration -Follow labs    Hypertension -Currently with orthostatic hypotension and soft blood pressures so all antihypertensive medications on hold    History of CVA (cerebrovascular accident) 06/08/14 -Recommended to continue dual antiplatelet therapy for a total of 3 months then Plavix alone -During last admission CT angio of head and neck 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 Echo without any evidence of embolic source. Neurology recommended 30 day heart monitor as outpatient at time of discharge, likely to monitor for arrhythmia such as atrial fibrillation -Noted with congestion involving the right lung as well as recurrent issues with upper airway congestion all of which are concerning for possible aspiration and possible dysphagia -will have speech therapy evaluate this admission and in the interim place on a dysphagia 2 diet -X-ray unremarkable and no fever or leukocytosis so doubt has acute aspiration pneumonitis at this juncture -PT/OT evaluations this admission    Bradycardia -Was not a problem during the last admission -Suspect accumulation of carvedilol in setting of acute renal failure contributing therefore this medication placed on hold -Telemetry as  above    CAD  -Patient denies issues with chest pain or shortness of breath with activity -Mild elevation in troponin  at admission but this could be related to acute renal failure and recent hypotension i.e. demand ischemia    HLD  -Continue statin    Abnormal urinalysis -Could be related to volume depletion -Check urine culture -No fever or leukocytosis and no indication for empiric antibiotics at this juncture    DVT Prophylaxis: Subcutaneous heparin  Family Communication:   Brother at bedside  Code Status:  Full code  Condition:  Stable  Time spent in minutes : 60   ELLIS,ALLISON L. ANP on 07/04/2014 at 4:45 PM  Between 7am to 7pm - Pager - [phone]  After 7pm go to [email or web address] - password [email or web address]  And look for the night coverage person covering me after hours  [facility]   Addendum  I personally evaluated patient on 07/04/2014 and agree with the above findings. Patient is a 75 year old gentleman with a past medical history of alcoholism, recently discharged from the medicine service on 06/11/2014 at which time he was treated for an acute CVA. At that time he presented with mental status changes with MRI of brain performed 06/07/2014 showing multiple acute intake infarcts in the anterior and posterior circulation. He was seen and evaluated by neurology, improved, and discharged to skilled nursing facility. He had 2 syncopal events at his facility today witnessed by nursing home staff. He was hypotensive and bradycardic on presentation, having blood pressure 91/53 and heart rate of 52. Labs showed the development of acute kidney injury with creatinine of 2.33, increased from 0.99 on 06/09/2014. I suspect patient via depleted. Blood pressures improved with administration of IV fluids in the emergency department. Will admit patient to telemetry, discontinue amlodipine, lisinopril, carvedilol. IV fluid hydration overnight, repeat BMP in a.m. I wonder about the  possibility of silent aspiration as he had coarse respiratory sounds on exam. Place him on dysphagia 2 diet, consult speech pathology.

## 2014-07-04 NOTE — Progress Notes (Signed)
Patient received to rm 2W32 from ED.  Introduced to staff and to unit routine.  Vital signs taken and recorded.  Patient appears calm / flat affect revealed.  Integumentary status dry, but skin is intact.  High fall risk due to confused state, debilitated status, and recent fall.

## 2014-07-04 NOTE — ED Notes (Signed)
[facility]- [facility]. Had syncopal episode after physical therapy. Pt found to be very hypotensive 77/55. CBG of 108. Alert to person on arrival. IV line in place, pt received 500cc of fluid.

## 2014-07-04 NOTE — Clinical Social Work Psychosocial (Signed)
     Clinical Social Work Department BRIEF PSYCHOSOCIAL ASSESSMENT 07/04/2014  Patient:  Ian [name],Ian [name]     Account Number:  [id number]     Admit date:  07/04/2014  Clinical Social Worker:  Corlis HoveMCLEAN,JENEYA, CLINICAL SOCIAL WORKER  Date/Time:  07/04/2014 08:01 PM  Referred by:  Physician  Date Referred:  07/04/2014 Referred for  SNF Placement   Other Referral:   Interview type:  Patient Other interview type:    PSYCHOSOCIAL DATA Living Status:  FACILITY Admitted from facility:  [facility] Level of care:  Skilled Nursing Facility Primary support name:   Primary support relationship to patient:   Degree of support available:    CURRENT CONCERNS Current Concerns  Post-Acute Placement   Other Concerns:    SOCIAL WORK ASSESSMENT / PLAN CSW Intern attempted to speak with patient about returning to SNF placement upon discharge. Patient's response to all questions was that he didn't remember. Patient could not tell CSW Intern where he lived before being hospitalized, why he was hospitalized, where he was going after discharge, or family contact information.   Assessment/plan status:  Psychosocial Support/Ongoing Assessment of Needs Other assessment/ plan:   Information/referral to community resources:    PATIENTS/FAMILYS RESPONSE TO PLAN OF CARE: Patient was not oriented to person, place, time or situation. Patient is currently unavailable to give information about discharge planning needs. No family or friends at bedside.

## 2014-07-04 NOTE — ED Provider Notes (Signed)
CSN: [id number]     Arrival date & time 07/04/14  1339 History   First MD Initiated Contact with Patient 07/04/14 1342     Chief Complaint  Patient presents with  . Hypotension  . Loss of Consciousness     (Consider location/radiation/quality/duration/timing/severity/associated sxs/prior Treatment) HPI    74[facility]. Apparently was able to complete physical therapy but had syncopal event right after? Pt is poor historian himself. Reportedly helped back to his room after and then had a repeat event. Pt currently has no complaint. Denies pain anywhere. No SOB. Denies dizziness or lightheadedness.   Past Medical History  Diagnosis Date  . CAD (coronary artery disease)     LIMA to OM2, SVG to OM1, SVG to RCA  . HLD (hyperlipidemia)   . HTN (hypertension)   . BPH (benign prostatic hyperplasia)   . Stroke    Past Surgical History  Procedure Laterality Date  . Coronary artery bypass graft  2001  . Hernia repair     Family History  Problem Relation Age of Onset  . Arthritis    . Hypertension    . Stroke    . Heart attack Mother   . Heart attack Father    History  Substance Use Topics  . Smoking status: Current Some Day Smoker -- 0.50 packs/day for 40 years    Types: Cigarettes  . Smokeless tobacco: Never Used  . Alcohol Use: 3.0 oz/week    5 Shots of liquor per week    Review of Systems  All systems reviewed and negative, other than as noted in HPI.   Allergies  Review of patient's allergies indicates no known allergies.  Home Medications   Prior to Admission medications   Medication Sig Start Date End Date Taking? Authorizing Provider  acetaminophen (TYLENOL) 325 MG tablet Take 2 tablets (650 mg total) by mouth every 6 (six) hours as needed for mild pain (or Fever >/= 101). 06/11/14   Dawood Elgergawy, MD  amLODipine (NORVASC) 5 MG tablet TAKE ONE TABLET BY MOUTH EVERY DAY Patient not taking: Reported on 06/07/2014 09/20/11    Kathleene Hazelhristopher D McAlhany, MD  aspirin EC 81 MG EC tablet Take 1 tablet (81 mg total) by mouth daily. 06/11/14   Huey Bienenstockawood Elgergawy, MD  carvedilol (COREG) 6.25 MG tablet Take 1 tablet (6.25 mg total) by mouth 2 (two) times daily with a meal. Patient not taking: Reported on 06/07/2014 08/17/10   Etta Grandchildhomas L Jones, MD  clopidogrel (PLAVIX) 75 MG tablet Take 1 tablet (75 mg total) by mouth daily. 06/11/14   Dawood Elgergawy, MD  FLUoxetine (PROZAC) 20 MG tablet Take 1 tablet (20 mg total) by mouth daily. Patient not taking: Reported on 06/07/2014 08/17/10   Etta Grandchildhomas L Jones, MD  folic acid (FOLVITE) 1 MG tablet Take 1 tablet (1 mg total) by mouth daily. 06/11/14   Huey Bienenstockawood Elgergawy, MD  hydrochlorothiazide 25 MG tablet Take 1 tablet (25 mg total) by mouth daily. Patient not taking: Reported on 06/07/2014 08/17/10   Etta Grandchildhomas L Jones, MD  lisinopril (PRINIVIL,ZESTRIL) 20 MG tablet Take 1 tablet (20 mg total) by mouth daily. Patient not taking: Reported on 06/07/2014 08/17/10   Etta Grandchildhomas L Jones, MD  Multiple Vitamins-Minerals (MULTIVITAMIN) tablet Take 1 tablet by mouth daily. 06/11/14   Huey Bienenstockawood Elgergawy, MD  simvastatin (ZOCOR) 40 MG tablet Take 1 tablet (40 mg total) by mouth at bedtime. Patient not taking: Reported on 06/07/2014 08/17/10   Etta Grandchildhomas L Jones,  MD  thiamine (VITAMIN B-1) 100 MG tablet Take 2 tablets (200 mg total) by mouth daily. 06/11/14   Dawood Elgergawy, MD   BP 91/53 mmHg  Pulse 52  Resp 15  SpO2 96% Physical Exam  Constitutional: He appears well-developed and well-nourished. No distress.  HENT:  Head: Normocephalic and atraumatic.  Eyes: Conjunctivae are normal. Right eye exhibits no discharge. Left eye exhibits no discharge.  Neck: Neck supple.  Cardiovascular: Regular rhythm and normal heart sounds.  Exam reveals no gallop and no friction rub.   No murmur heard. bradycardia  Pulmonary/Chest: Effort normal and breath sounds normal. No respiratory distress.  Abdominal: Soft. He exhibits no distension.  There is no tenderness.  Musculoskeletal: He exhibits no edema or tenderness.  Neurological: He is alert.  Disoriented to time. Follows commands. CN 2-12 intact. Strength 5/5 b/l u/l extremities.   Skin: Skin is warm and dry.  Psychiatric: He has a normal mood and affect. His behavior is normal. Thought content normal.  Nursing note and vitals reviewed.   ED Course  Procedures (including critical care time) Labs Review Labs Reviewed  TROPONIN I - Abnormal; Notable for the following:    Troponin I 0.09 (*)    All other components within normal limits  BASIC METABOLIC PANEL - Abnormal; Notable for the following:    Sodium 134 (*)    CO2 15 (*)    BUN 55 (*)    Creatinine, Ser 2.33 (*)    Calcium 7.3 (*)    GFR calc non Af Amer 26 (*)    GFR calc Af Amer 30 (*)    All other components within normal limits  CBC WITH DIFFERENTIAL/PLATELET - Abnormal; Notable for the following:    MCV 69.8 (*)    MCH 22.9 (*)    Monocytes Relative 17 (*)    All other components within normal limits  URINE CULTURE  URINALYSIS, ROUTINE W REFLEX MICROSCOPIC    Imaging Review Ct Head Wo Contrast  07/04/2014   CLINICAL DATA:  Syncopal episode today after physical therapy, history of recent stroke with vertebral artery occlusion, hurts all over  EXAM: CT HEAD WITHOUT CONTRAST  TECHNIQUE: Contiguous axial images were obtained from the base of the skull through the vertex without intravenous contrast.  COMPARISON:  06/07/2014  FINDINGS: The bony calvarium is intact. No gross soft tissue abnormality is noted. Chronic atrophic changes and chronic white matter ischemic change is seen. There are changes consistent with prior lacunar infarct in the left centrum semiovale. No acute ischemic changes are seen.  IMPRESSION: Chronic atrophic and ischemic changes.  No acute abnormality noted.   Electronically Signed   By: Alcide Clever M.D.   On: 07/04/2014 15:12   Dg Chest Portable 1 View  07/04/2014   CLINICAL DATA:   Hypotension, weakness and chest pain today. Questionable loss of consciousness today. History of hypertension and CABG surgery.  EXAM: PORTABLE CHEST - 1 VIEW  COMPARISON:  06/08/2014  FINDINGS: Changes from CABG surgery are stable. Cardiac silhouette is mildly enlarged. No mediastinal or hilar masses or evidence of adenopathy.  Clear lungs.  No pleural effusion or pneumothorax.  Bony thorax is intact.  IMPRESSION: No acute cardiopulmonary disease.   Electronically Signed   By: Amie Portland M.D.   On: 07/04/2014 16:04     EKG Interpretation   Date/Time:  Thursday July 04 2014 13:40:31 EST Ventricular Rate:  51 PR Interval:  156 QRS Duration: 92 QT Interval:  438 QTC Calculation:  403 R Axis:   -47 Text Interpretation:  Sinus rhythm Borderline low voltage, extremity leads  mild inferior STE Confirmed by Amiri Tritch  MD, Bowen Kia ([id number]) on 07/04/2014  3:23:38 PM      MDM   Final diagnoses:  AKI (acute kidney injury)  Syncope and collapse  Bradycardia    74yM with syncope. Pt with no complaints but not reliable historian. Hypotensive and bradycardic. EKG with mild STE inferiorly and minimally elevated troponin. Pt denies CP or SOB though. Potentially demand ischemia from hypotension. AKI. BUN 55 with Cr 2.33. Cr 0.99 three weeks ago. Most likely explanation of hypotension. Pt previously noncompliant with meds and now presumably getting them regularly while at [facility]. Hold antihypertensives, rate controlling, potentially nephrotoxic meds for now with further management by admitting team. IVF. Neuro exam nonfocal aside from generally slow. Will CT though particularly with recent CVA.    Raeford Razor, MD 07/09/14 [phone]

## 2014-07-05 ENCOUNTER — Encounter (HOSPITAL_COMMUNITY): Payer: Self-pay | Admitting: General Practice

## 2014-07-05 DIAGNOSIS — E875 Hyperkalemia: Secondary | ICD-10-CM

## 2014-07-05 DIAGNOSIS — E860 Dehydration: Secondary | ICD-10-CM

## 2014-07-05 LAB — BASIC METABOLIC PANEL
ANION GAP: 7 (ref 5–15)
BUN: 40 mg/dL — ABNORMAL HIGH (ref 6–23)
CALCIUM: 9.2 mg/dL (ref 8.4–10.5)
CO2: 24 mmol/L (ref 19–32)
CREATININE: 1.84 mg/dL — AB (ref 0.50–1.35)
Chloride: 107 mmol/L (ref 96–112)
GFR calc Af Amer: 41 mL/min — ABNORMAL LOW (ref >90)
GFR, EST NON AFRICAN AMERICAN: 35 mL/min — AB (ref >90)
Glucose, Bld: 107 mg/dL — ABNORMAL HIGH (ref 70–99)
POTASSIUM: 5.1 mmol/L (ref 3.5–5.1)
Sodium: 138 mmol/L (ref 135–145)

## 2014-07-05 LAB — CBC
HCT: 46.8 % (ref 39.0–52.0)
Hemoglobin: 15.7 g/dL (ref 13.0–17.0)
MCH: 23.3 pg — ABNORMAL LOW (ref 26.0–34.0)
MCHC: 33.5 g/dL (ref 30.0–36.0)
MCV: 69.5 fL — ABNORMAL LOW (ref 78.0–100.0)
PLATELETS: ADEQUATE 10*3/uL (ref 150–400)
RBC: 6.73 MIL/uL — ABNORMAL HIGH (ref 4.22–5.81)
RDW: 15.2 % (ref 11.5–15.5)
WBC: 7.9 10*3/uL (ref 4.0–10.5)

## 2014-07-05 LAB — COMPREHENSIVE METABOLIC PANEL
ALT: 11 U/L (ref 0–53)
ANION GAP: 9 (ref 5–15)
AST: 16 U/L (ref 0–37)
Albumin: 3.6 g/dL (ref 3.5–5.2)
Alkaline Phosphatase: 80 U/L (ref 39–117)
BILIRUBIN TOTAL: 0.8 mg/dL (ref 0.3–1.2)
BUN: 50 mg/dL — ABNORMAL HIGH (ref 6–23)
CO2: 21 mmol/L (ref 19–32)
Calcium: 9.1 mg/dL (ref 8.4–10.5)
Chloride: 106 mmol/L (ref 96–112)
Creatinine, Ser: 2.1 mg/dL — ABNORMAL HIGH (ref 0.50–1.35)
GFR calc Af Amer: 35 mL/min — ABNORMAL LOW (ref >90)
GFR calc non Af Amer: 30 mL/min — ABNORMAL LOW (ref >90)
Glucose, Bld: 83 mg/dL (ref 70–99)
Potassium: 5.5 mmol/L — ABNORMAL HIGH (ref 3.5–5.1)
SODIUM: 136 mmol/L (ref 135–145)
TOTAL PROTEIN: 6.5 g/dL (ref 6.0–8.3)

## 2014-07-05 LAB — SODIUM, URINE, RANDOM: Sodium, Ur: 102 mmol/L

## 2014-07-05 LAB — URINE CULTURE

## 2014-07-05 LAB — TROPONIN I
Troponin I: <0.03 ng/mL (ref <0.031)
Troponin I: <0.03 ng/mL (ref <0.031)

## 2014-07-05 LAB — CREATININE, URINE, RANDOM: Creatinine, Urine: 94.67 mg/dL

## 2014-07-05 MED ORDER — SODIUM POLYSTYRENE SULFONATE 15 GM/60ML PO SUSP
30.0000 g | Freq: Once | ORAL | Status: AC
  Administered 2014-07-05: 30 g via ORAL
  Filled 2014-07-05: qty 120

## 2014-07-05 MED ORDER — SODIUM CHLORIDE 0.9 % IV SOLN
INTRAVENOUS | Status: DC
  Administered 2014-07-05 – 2014-07-06 (×2): via INTRAVENOUS

## 2014-07-05 NOTE — Progress Notes (Signed)
Utilization review completed. Areli Frary, RN, BSN. 

## 2014-07-05 NOTE — Evaluation (Signed)
Clinical/Bedside Swallow Evaluation Patient Details  Name: Ian [name] MRN: [id number] Date of Birth: Sep 21, 1940  Today's Date: 07/05/2014 Time: SLP Start Time (ACUTE ONLY): 1015 SLP Stop Time (ACUTE ONLY): 1025 SLP Time Calculation (min) (ACUTE ONLY): 10 min  Past Medical History:  Past Medical History  Diagnosis Date  . CAD (coronary artery disease)     LIMA to OM2, SVG to OM1, SVG to RCA  . HLD (hyperlipidemia)   . HTN (hypertension)   . BPH (benign prostatic hyperplasia)   . Stroke    Past Surgical History:  Past Surgical History  Procedure Laterality Date  . Coronary artery bypass graft  2001  . Hernia repair     HPI:  Ian [name] is a 74 y.o. male, who was recently discharged to rehabilitation and subsequently to skilled nursing facility after treatment for acute encephalopathy and a cerebrovascular accident. Prior to that admission patient had been living at home and apparently had been experiencing issues related to alcohol abuse. It was felt that Wernicke's encephalopathy may also have been contributing to his neurological decline. Since discharge to the skilled nursing facility the patient has been doing relatively well. He is been working with PT and while working with PT patient apparently was asymptomatic during the therapy session but once he was walked back to his room he had a syncopal episode 2 occasions. EMS was called. He was found to be hypotensive with a blood pressure 77/55. Chest x-ray was unremarkable. Pt was not treated for swallowing upon last admission.    Assessment / Plan / Recommendation Clinical Impression  Pt demonstrates normal swallow function and is tolerating regular diet and thin liquids well. No further SLP intervention needed. Will sign off.     Aspiration Risk  Mild    Diet Recommendation Regular;Thin liquid   Liquid Administration via: Cup;Straw Medication Administration: Whole meds with liquid Supervision: Staff to assist with self  feeding Postural Changes and/or Swallow Maneuvers: Seated upright 90 degrees    Other  Recommendations Oral Care Recommendations: Oral care BID   Follow Up Recommendations  None    Frequency and Duration        Pertinent Vitals/Pain NA    SLP Swallow Goals     Swallow Study Prior Functional Status       General HPI: Ian [name] is a 74 y.o. male, who was recently discharged to rehabilitation and subsequently to skilled nursing facility after treatment for acute encephalopathy and a cerebrovascular accident. Prior to that admission patient had been living at home and apparently had been experiencing issues related to alcohol abuse. It was felt that Wernicke's encephalopathy may also have been contributing to his neurological decline. Since discharge to the skilled nursing facility the patient has been doing relatively well. He is been working with PT and while working with PT patient apparently was asymptomatic during the therapy session but once he was walked back to his room he had a syncopal episode 2 occasions. EMS was called. He was found to be hypotensive with a blood pressure 77/55. Chest x-ray was unremarkable. Pt was not treated for swallowing upon last admission.  Type of Study: Bedside swallow evaluation Previous Swallow Assessment: none Diet Prior to this Study: Regular;Thin liquids Temperature Spikes Noted: No Respiratory Status: Room air History of Recent Intubation: No Behavior/Cognition: Alert;Cooperative Oral Cavity - Dentition: Adequate natural dentition Self-Feeding Abilities: Able to feed self Patient Positioning: Upright in bed Baseline Vocal Quality: Clear Volitional Cough: Congested    Oral/Motor/Sensory Function Overall Oral  Motor/Sensory Function: Impaired at baseline Labial ROM: Reduced left Labial Symmetry: Within Functional Limits Labial Strength: Within Functional Limits Lingual ROM: Other (Comment) (poor effort) Lingual Symmetry: Within  Functional Limits Lingual Strength: Within Functional Limits Facial ROM: Within Functional Limits Facial Symmetry: Within Functional Limits Facial Strength: Within Functional Limits Facial Sensation: Within Functional Limits Velum: Within Functional Limits Mandible: Within Functional Limits   Ice Chips     Thin Liquid Thin Liquid: Within functional limits    Nectar Thick Nectar Thick Liquid: Not tested   Honey Thick Honey Thick Liquid: Not tested   Puree Puree: Not tested   Solid   GO    Solid: Within functional limits       [name], Riley NearingBonnie Caroline 07/05/2014,10:31 AM

## 2014-07-05 NOTE — Progress Notes (Signed)
OT Cancellation Note  Patient Details Name: Ian FlockDavid [name] MRN: [id number] DOB: 02/07/1941   Cancelled Treatment:    Reason Eval/Treat Not Completed: Medical issues which prohibited therapy. Awaiting downward trend of troponin. Will continue to follow.  Evern BioMayberry, Corrie Brannen Lynn 07/05/2014, 12:47 PM  [phone]

## 2014-07-05 NOTE — Evaluation (Signed)
Physical Therapy Evaluation Patient Details Name: Ian [name] MRN: [id number] DOB: [birth year] Today's Date: 07/05/2014   History of Present Illness  74 year old male with history of CAD, HLD, HTN, BPH, hospitalized 06/07/14-06/11/14 for acute encephalopathy related to acute CVA complicating underlying dementia and possible Wernicke's encephalopathy. He was doing relatively well at SNF until 2/18 when he had syncopal episodes 2. EMS found him to be hypotensive with blood pressures in the 77/55 mmHg. He was admitted for syncope, hypotension, dehydration and acute renal failure.  Clinical Impression  Pt admitted with the above complications. Pt currently with functional limitations due to the deficits listed below (see PT Problem List). Disoriented to time and situation, also does not recall staying at [facility] prior to admission. Ambulates with min assist for stability. Easily distracted and required several standing rest breaks to complete distance covered today. Pt wheezing with mild/moderate dyspnea at end of ambulatory bout but unable to obtain pulse ox reading despite multiple attempts.His dyspnea resolved after sitting for approximately 5 minutes. RN notified. Pt will benefit from skilled PT to increase their independence and safety with mobility to allow discharge to the venue listed below.       Follow Up Recommendations SNF;Supervision/Assistance - 24 hour    Equipment Recommendations  None recommended by PT    Recommendations for Other Services       Precautions / Restrictions Precautions Precautions: Fall Restrictions Weight Bearing Restrictions: No      Mobility  Bed Mobility Overal bed mobility: Needs Assistance Bed Mobility: Supine to Sit     Supine to sit: Min guard     General bed mobility comments: Close guard for safety. Requires extra time.  Transfers Overall transfer level: Needs assistance Equipment used: Rolling walker (2 wheeled) Transfers: Sit to/from  Stand Sit to Stand: Min guard         General transfer comment: Min guard for safety. VC for hand placement. Impulsive on several occasions attempting to stand before lines/cath arranged.  Ambulation/Gait Ambulation/Gait assistance: Min assist Ambulation Distance (Feet): 80 Feet Assistive device: Rolling walker (2 wheeled) Gait Pattern/deviations: Step-through pattern;Decreased step length - right;Decreased step length - left;Decreased stance time - right;Decreased stride length;Trendelenburg;Trunk flexed;Drifts right/left;Narrow base of support Gait velocity: decereased   General Gait Details: Slow and guarded, easily distracted in hallway. Cues for walker placement, occasionally drifting into surrounding objects. Min assist for stability and direction of RW. Decreased control of Rt knee however did not buckle. Required multiple standing rest breaks to complete distance.  Stairs            Wheelchair Mobility    Modified Rankin (Stroke Patients Only)       Balance Overall balance assessment: Needs assistance Sitting-balance support: No upper extremity supported;Feet supported Sitting balance-Leahy Scale: Fair     Standing balance support: Bilateral upper extremity supported Standing balance-Leahy Scale: Poor                               Pertinent Vitals/Pain Pain Assessment: No/denies pain    Home Living Family/patient expects to be discharged to:: Skilled nursing facility Living Arrangements: Alone               Additional Comments: Pt is a poor historian. States he was living at home alone, but per notes he was transported to [facility] from SNF.    Prior Function Level of Independence: Needs assistance         Comments: Pt  poor historian. Was at SNF prior to admission but does not recall.     Hand Dominance   Dominant Hand: Right    Extremity/Trunk Assessment   Upper Extremity Assessment: Defer to OT evaluation           Lower  Extremity Assessment: Generalized weakness         Communication   Communication: No difficulties  Cognition Arousal/Alertness: Awake/alert Behavior During Therapy: Flat affect;Impulsive Overall Cognitive Status: No family/caregiver present to determine baseline cognitive functioning (Disoriented to month and situation.)                      General Comments General comments (skin integrity, edema, etc.): Upon returning to room pt with mild wheezing and 2/4 dyspnea. Attempted multiple readings for SpO2 from pulse ox and dynamap but unable to obtain. RN notified, and dyspnea resolved while sitting.    Exercises General Exercises - Lower Extremity Ankle Circles/Pumps: AROM;Both;10 reps;Seated      Assessment/Plan    PT Assessment Patient needs continued PT services  PT Diagnosis Difficulty walking;Generalized weakness;Abnormality of gait   PT Problem List Decreased strength;Decreased range of motion;Decreased activity tolerance;Decreased balance;Decreased mobility;Decreased coordination;Decreased cognition;Decreased knowledge of use of DME;Decreased safety awareness;Decreased knowledge of precautions  PT Treatment Interventions DME instruction;Gait training;Functional mobility training;Therapeutic activities;Therapeutic exercise;Balance training;Neuromuscular re-education;Cognitive remediation;Patient/family education   PT Goals (Current goals can be found in the Care Plan section) Acute Rehab PT Goals Patient Stated Goal: none stated PT Goal Formulation: With patient Time For Goal Achievement: 07/19/14 Potential to Achieve Goals: Fair    Frequency Min 2X/week   Barriers to discharge Decreased caregiver support lives alone    Co-evaluation               End of Session Equipment Utilized During Treatment: Gait belt Activity Tolerance: Patient tolerated treatment well Patient left: in chair;with chair alarm set;with call bell/phone within reach;with  nursing/sitter in room Nurse Communication: Mobility status;Other (comment) (wheezing, unable to obtain SpO2 reading)         Time: [id number] PT Time Calculation (min) (ACUTE ONLY): 23 min   Charges:   PT Evaluation $Initial PT Evaluation Tier I: 1 Procedure PT Treatments $Gait Training: 8-22 mins   PT G Codes:        [place], Marshall Kampf S 07/05/2014, 3:54 PM Sunday SpillersLogan Secor [place], [place]PT [phone]

## 2014-07-05 NOTE — Progress Notes (Addendum)
Pt had fall 0620, found by RN after bed alarm went off, sitting on floor in urine. Patient appears to have gotten out of bed, urinated, slipped in urine. No acute injury, VSS, no complaints of pain. Patient able to stand with assist back to bed. MD notified. Family notified. Will continue to monitor.

## 2014-07-05 NOTE — Progress Notes (Signed)
Patient is poor historian, no family at bedside. Admission nurse unable to complete admission. Will call again when family at bedside to complete.

## 2014-07-05 NOTE — Clinical Social Work Note (Signed)
[facility]- confirmed with family- he is short term and family is agreeable [facility].  Merlyn LotJenna Holoman, [facility]-PsychCSWA Clinical Social Worker [phone]

## 2014-07-05 NOTE — Progress Notes (Addendum)
PROGRESS NOTE    Ian [name] ZOX:[id number] DOB: 09-26-1940 DOA: 07/04/2014 PCP: No primary care provider on file.  HPI/Brief narrative 74 year old male with history of CAD, HLD, HTN, BPH, hospitalized 06/07/14-06/11/14 for acute encephalopathy related to acute CVA complicating underlying dementia and possible Wernicke's encephalopathy. Neurology had consulted and recommendation for dual antiplatelet therapy with aspirin and Plavix for 3 months, then continue with Plavix alone and 30 day heart monitor to evaluate for arrhythmias. He was discharged to rehabilitation and subsequently to SNF. As per report, a shunt had been doing relatively well until 2/18 when he had syncopal episodes 2. EMS found him to be hypotensive with blood pressures in the 77/55 mmHg. In the ED BP 91/53, pulse 52, BUN 55 and creatinine 2.33 (0.99 during recent discharge), minimally elevated troponin of 0.09, chest x-ray unremarkable, telemetry with sinus bradycardia,  EKG sinus bradycardia. He was admitted for syncope, hypotension, dehydration and acute renal failure.   Assessment/Plan:  1. Syncope and collapse: Secondary to hypotension/orthostatic hypotension. Treat underlying cause. PT and OT evaluation and monitor. 2. Hypotension: Possibly related to dehydration and antihypertensives. Antihypertensives have been temporarily held. Still mildly orthostatic today (SBP 117 lying >99 standing). IV fluids and monitor. 3. Acute renal failure: Likely related to dehydration, diuretics, ACEI and? ATN from hypotension. Culprit medications held. IV fluids. Creatinine is slightly better. Follow BMP in a.m. If creatinine not progressively improving, consider renal ultrasound. 4. Mild hyperkalemia: Provide dose of Kayexalate and follow BMP this evening. 5. Dehydration: Unclear etiology.? Related to poor oral intake and diuretics. IV fluids. 6. Essential hypertension: Came in hypotensive. Meds held. 7. History of recent CVA: Dual  antiplatelets recommended for 3 months (beginning 06/11/14) followed by Plavix alone. Patient had been extensively evaluated during recent admission including neurology consultation. 30 day heart monitor was recommended at time of discharge-not sure if this has been done-Will need to verify with SNF prior to DC. [facility] does not have a record of heart monitor done through them. Speech therapy recommends a regular diet and thin liquids. 8. History of CAD/elevated troponin: No reported chest pain. Minimally elevated troponin could be from demand ischemia related to hypotension and renal failure. We'll continue to trend troponin and if continues to increase or patient becomes symptomatic, consider cardiology consultation. 2-D echo 1/23 showed normal EF and grade 1 diastolic dysfunction. 9. History of HLD: Continue statins 10. History of alcohol abuse: Has been at nursing facilities since discharge 1/26. 11. History of mechanical fall: On 2/19 in the hospital. No overt injuries. Follow-up precautions. 12. Chronic mental status changes: Possibly related to Wernicke's encephalopathy and recent strokes. Monitor   Code Status: Full Family Communication: None at bedside Disposition Plan: SNF when medically stable.   Consultants:  None  Procedures:  None  Antibiotics:  None   Subjective: S/P fall early this am- patient denies injuries. Patient denies complaints this morning. As per nursing, no acute events since the fall.  Objective: Filed Vitals:   07/04/14 1745 07/04/14 1800 07/04/14 1924 07/05/14 0514  BP: 113/68 113/67 127/70 123/64  Pulse: 55 55 54 57  Temp:   97.7 F (36.5 C) 97.6 F (36.4 C)  TempSrc:   Oral Oral  Resp: Height:    (1.753 m)   Weight:   78.7 kg (173 lb 8 oz) 78.5 kg (173 lb 1 oz)  SpO2: 100% 100% 100% 100%    Intake/Output Summary (Last 24 hours) at 07/05/14 1132 Last data filed  at 07/05/14 1100  Gross per 24 hour  Intake 1376.67 ml    Output    250 ml  Net 1126.67 ml   Filed Weights   07/04/14 1924 07/05/14 0514  Weight: 78.7 kg (173 lb 8 oz) 78.5 kg (173 lb 1 oz)     Exam:  General exam: pleasant elderly male lying comfortably supine in bed. No overt injuries seen. Respiratory system: Clear. No increased work of breathing. Cardiovascular system: S1 & S2 heard, RRR. No JVD, murmurs, gallops, clicks or pedal edema. telemetry: Sinus rhythm in the 60s with occasional PVCs. Occasional sinus bradycardia in the 50s.  Gastrointestinal system: Abdomen is nondistended, soft and nontender. Normal bowel sounds heard. Central nervous system: Alert and oriented to self and place . No focal neurological deficits. Extremities: Symmetric 5 x 5 power.   Data Reviewed: Basic Metabolic Panel:  Recent Labs Lab 07/04/14 1400 07/05/14 0858  NA 134* 136  K 5.1 5.5*  CL 110 106  CO2 15* 21  GLUCOSE 86 83  BUN 55* 50*  CREATININE 2.33* 2.10*  CALCIUM 7.3* 9.1   Liver Function Tests:  Recent Labs Lab 07/05/14 0858  AST 16  ALT 11  ALKPHOS 80  BILITOT 0.8  PROT 6.5  ALBUMIN 3.6   No results for input(s): LIPASE, AMYLASE in the last 168 hours. No results for input(s): AMMONIA in the last 168 hours. CBC:  Recent Labs Lab 07/04/14 1400 07/05/14 0600  WBC 5.1 7.9  NEUTROABS 2.4  --   HGB 13.1 15.7  HCT 40.0 46.8  MCV 69.8* 69.5*  PLT 207 PLATELET CLUMPS NOTED ON SMEAR, COUNT APPEARS ADEQUATE   Cardiac Enzymes:  Recent Labs Lab 07/04/14 1400  TROPONINI 0.09*   BNP (last 3 results) No results for input(s): PROBNP in the last 8760 hours. CBG: No results for input(s): GLUCAP in the last 168 hours.  No results found for this or any previous visit (from the past 240 hour(s)).        Studies: Ct Head Wo Contrast  07/04/2014   CLINICAL DATA:  Syncopal episode today after physical therapy, history of recent stroke with vertebral artery occlusion, hurts all over  EXAM: CT HEAD WITHOUT CONTRAST   TECHNIQUE: Contiguous axial images were obtained from the base of the skull through the vertex without intravenous contrast.  COMPARISON:  06/07/2014  FINDINGS: The bony calvarium is intact. No gross soft tissue abnormality is noted. Chronic atrophic changes and chronic white matter ischemic change is seen. There are changes consistent with prior lacunar infarct in the left centrum semiovale. No acute ischemic changes are seen.  IMPRESSION: Chronic atrophic and ischemic changes.  No acute abnormality noted.   Electronically Signed   By: Alcide CleverMark  Lukens M.D.   On: 07/04/2014 15:12   Dg Chest Portable 1 View  07/04/2014   CLINICAL DATA:  Hypotension, weakness and chest pain today. Questionable loss of consciousness today. History of hypertension and CABG surgery.  EXAM: PORTABLE CHEST - 1 VIEW  COMPARISON:  06/08/2014  FINDINGS: Changes from CABG surgery are stable. Cardiac silhouette is mildly enlarged. No mediastinal or hilar masses or evidence of adenopathy.  Clear lungs.  No pleural effusion or pneumothorax.  Bony thorax is intact.  IMPRESSION: No acute cardiopulmonary disease.   Electronically Signed   By: Amie Portlandavid  Ormond M.D.   On: 07/04/2014 16:04        Scheduled Meds: . aspirin EC  81 mg Oral Daily  . clopidogrel  75 mg Oral Daily  .  folic acid  1 mg Oral Daily  . heparin  5,000 Units Subcutaneous 3 times per day  . multivitamin with minerals  1 tablet Oral Daily  . simvastatin  40 mg Oral QHS  . sodium chloride  3 mL Intravenous Q12H  . thiamine  200 mg Oral Daily   Continuous Infusions: . sodium chloride 100 mL/hr at 07/05/14 0615  . sodium chloride 100 mL/hr (07/05/14 0514)    Principal Problem:   Syncope and collapse Active Problems:   CAD (coronary artery disease)   Hypertension   History of CVA (cerebrovascular accident) 06/08/14   HLD (hyperlipidemia)   Acute renal failure   Bradycardia   Dehydration   Abnormal urinalysis    Time spent: 45 minutes    Billiejean Schimek,  MD, FACP, FHM. [facility] Pager [phone]  If 7PM-7AM, please contact night-coverage [email or web address] Password [email or web address] 07/05/2014, 11:32 AM    LOS: 1 day

## 2014-07-05 NOTE — Progress Notes (Signed)
PT Cancellation Note  Patient Details Name: Ian [name] MRN: [id number] DOB: 08/04/1940   Cancelled Treatment:    Reason Eval/Treat Not Completed: Medical issues which prohibited therapy Patient with elevated troponin on admission. Per departmental guidelines, PT evaluation is to be held until serial testing shows downward trend of troponin, or patient is cleared by MD. Will follow up.  Ian [name], Ian [name] 07/05/2014, 8:09 AM Ian [name], PT [phone]

## 2014-07-06 LAB — BASIC METABOLIC PANEL
Anion gap: 7 (ref 5–15)
BUN: 30 mg/dL — AB (ref 6–23)
CO2: 22 mmol/L (ref 19–32)
Calcium: 8.7 mg/dL (ref 8.4–10.5)
Chloride: 109 mmol/L (ref 96–112)
Creatinine, Ser: 1.48 mg/dL — ABNORMAL HIGH (ref 0.50–1.35)
GFR calc Af Amer: 53 mL/min — ABNORMAL LOW (ref >90)
GFR, EST NON AFRICAN AMERICAN: 46 mL/min — AB (ref >90)
Glucose, Bld: 75 mg/dL (ref 70–99)
Potassium: 4.6 mmol/L (ref 3.5–5.1)
Sodium: 138 mmol/L (ref 135–145)

## 2014-07-06 LAB — TROPONIN I

## 2014-07-06 NOTE — Progress Notes (Signed)
OT Cancellation Note  Patient Details Name: Artis FlockDavid Bies MRN: [id number] DOB: 05/23/1940   Cancelled Treatment:    Reason Eval/Treat Not Completed: Other (comment) Pt has Medicare and [facility]; current D/C plan is SNF. No apparent immediate acute care OT needs, therefore will defer OT to SNF. If OT eval is needed please call Acute Rehab Dept. at [phone] or text page OT at [phone].    Nena JordanMiller, Lorraine Terriquez M   Carney LivingLeeAnn Marie Alizandra Loh, OTR/L Occupational Therapist [phone] (pager)  07/06/2014, 6:00 PM

## 2014-07-06 NOTE — Progress Notes (Signed)
PROGRESS NOTE  Ian [name] WUJ:[id number] DOB: [birth year] DOA: 07/04/2014 PCP: No primary care provider on file.  HPI/Recap of past 24 hours: Demented elderly male laying in bed, NAD. Occasionally congested cough denies sob, no chest pain. Not oriented to time, but to place and person. Ivf/swallow eval  Assessment/Plan: Principal Problem:   Syncope and collapse Active Problems:   CAD (coronary artery disease)   Hypertension   History of CVA (cerebrovascular accident) 06/08/14   HLD (hyperlipidemia)   Acute renal failure   Bradycardia   Dehydration   Abnormal urinalysis  Syncope and collapse -continue telemetry -Suspect related to orthostasis in setting of dehydration and effects of antihypertensive medications -Although bradycardic, unclear if this contributing therefore we'll monitor activity while on telemetry -PT/OT evaluation -Hold offending medications -received IV fluids initially, stopped on 2/20 am, continue hold bp meds. 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 Echo without any evidence of embolic source. Neurology recommended 30 day heart monitor as outpatient at time of discharge, likely to monitor for arrhythmia such as atrial fibrillation -Noted with congestion involving the right lung as well as recurrent issues with upper airway congestion all of which are concerning for possible aspiration and possible dysphagia -will have speech therapy evaluate this admission and in the interim place on a dysphagia 2 diet -X-ray unremarkable and no fever or leukocytosis so doubt has acute aspiration pneumonitis at this juncture -PT/OT evaluations this admission 1. 30 day heart monitor was recommended at time of discharge-not sure if this has been done-Will need to verify with SNF prior to DC. [facility] does not have a record of heart monitor done through them. Speech therapy recommends a regular diet and thin liquids.  Bradycardia -Was not a problem during the last admission -Suspect accumulation of carvedilol in setting of acute renal failure contributing therefore this medication placed on hold -Telemetry as above   CAD  -Patient denies issues with chest pain or shortness of breath with activity -Mild elevation in troponin at admission but this could be related to acute renal failure and recent hypotension i.e. demand ischemia   HLD  -Continue statin   Abnormal urinalysis -Could be related to volume depletion -Check urine culture -No fever or leukocytosis and no indication for empiric antibiotics at this juncture     Code Status: full   Family Communication: none at bedside  Disposition Plan: snf   Consultants:  none  Procedures:  none  Antibiotics:  none   Objective: BP 117/65 mmHg  Pulse 70  Temp(Src) 98.6 F (37 C) (Oral)  Resp 16  Ht  (1.753 m)  Wt  79.561 kg (175 lb 6.4  oz)  BMI 25.89 kg/m2  SpO2 100%  Intake/Output Summary (Last 24 hours) at 07/06/14 8119 Last data filed at 07/06/14 0421  Gross per 24 hour  Intake    100 ml  Output   1500 ml  Net  -1400 ml   Filed Weights   07/04/14 1924 07/05/14 0514 07/06/14 0419  Weight: 78.7 kg (173 lb 8 oz) 78.5 kg (173 lb 1 oz) 79.561 kg (175 lb 6.4 oz)    Exam: General: In no acute distress, appears healthy and well nourished  Psych: Flat affect, in no apparent Suicidal or Homicidal ideations, Awake Alert, Oriented X name only. Appears to have short-term memory deficits and some difficulty formulating words.  Neuro: No focal neurological deficits, CN II through XII intact, Strength 5/5 all 4 extremities, Sensation intact all 4 extremities.  ENT: Ears and Eyes appear Normal, Conjunctivae clear, PER. Moist oral mucosa without erythema or exudates.  Neck: Supple, No lymphadenopathy appreciated  Respiratory: Somewhat diminished left side posteriorly, coarse rhonchi noted entire right side posteriorly, also upper airway congestion that clears briefly with coughing, room air with saturations 100%  Cardiac: RRR, No Murmurs, no LE edema noted, no JVD, No carotid bruits, peripheral pulses palpable at 2+, bradycardia heart rate 50-53  Abdomen: Positive bowel sounds, Soft, Non tender, Non distended, No masses appreciated, no obvious hepatosplenomegaly  Skin: No Cyanosis, Normal Skin Turgor, No Skin Rash or Bruise.  Extremities: Symmetrical without obvious trauma or injury, no effusions.  Data Reviewed: Basic Metabolic Panel:  Recent Labs Lab 07/04/14 1400 07/05/14 0858 07/05/14 1914 07/06/14 0745  NA 134* 136 138 138  K 5.1 5.5* 5.1 4.6  CL 110 106 107 109  CO2 15* GLUCOSE 86 83 107* 75  BUN 55* 50* 40* 30*  CREATININE 2.33* 2.10* 1.84* 1.48*  CALCIUM 7.3* 9.1 9.2 8.7   Liver Function Tests:  Recent Labs Lab 07/05/14 0858  AST 16  ALT 11  ALKPHOS 80  BILITOT 0.8    PROT 6.5  ALBUMIN 3.6   No results for input(s): LIPASE, AMYLASE in the last 168 hours. No results for input(s): AMMONIA in the last 168 hours. CBC:  Recent Labs Lab 07/04/14 1400 07/05/14 0600  WBC 5.1 7.9  NEUTROABS 2.4  --   HGB 13.1 15.7  HCT 40.0 46.8  MCV 69.8* 69.5*  PLT 207 PLATELET CLUMPS NOTED ON SMEAR, COUNT APPEARS ADEQUATE   Cardiac Enzymes:    Recent Labs Lab 07/04/14 1400 07/05/14 1241 07/05/14 1914 07/05/14 2325  TROPONINI 0.09* <0.03 <0.03 <0.03   BNP (last 3 results) No results for input(s): BNP in the last 8760 hours.  ProBNP (last 3 results) No results for input(s): PROBNP in the last 8760 hours.  CBG: No results for input(s): GLUCAP in the last 168 hours.  Recent Results (from the past 240 hour(s))  Urine culture     Status: None   Collection Time: 07/04/14  4:07 PM  Result Value Ref Range Status   Specimen Description URINE, CATHETERIZED  Final   Special Requests NONE  Final   Colony Count   Final    50,000 COLONIES/ML Performed at [facility]    Culture   Final    Multiple bacterial morphotypes present, none predominant. Suggest appropriate recollection if clinically indicated. Performed at [facility]    Report Status 07/05/2014 FINAL  Final     Studies: No results found.  Scheduled Meds: . aspirin  EC  81 mg Oral Daily  . clopidogrel  75 mg Oral Daily  . folic acid  1 mg Oral Daily  . heparin  5,000 Units Subcutaneous 3 times per day  . multivitamin with minerals  1 tablet Oral Daily  . simvastatin  40 mg Oral QHS  . sodium chloride  3 mL Intravenous Q12H  . thiamine  200 mg Oral Daily    Continuous Infusions: stopped von 2/20     Anias Bartol  [facility] Pager [phone]. If 7PM-7AM, please contact night-coverage at [email or web address], password [email or web address] 07/06/2014, 9:37 AM  LOS: 2 days

## 2014-07-07 LAB — BASIC METABOLIC PANEL
Anion gap: 5 (ref 5–15)
BUN: 16 mg/dL (ref 6–23)
CHLORIDE: 107 mmol/L (ref 96–112)
CO2: 26 mmol/L (ref 19–32)
CREATININE: 1.37 mg/dL — AB (ref 0.50–1.35)
Calcium: 9.3 mg/dL (ref 8.4–10.5)
GFR, EST AFRICAN AMERICAN: 58 mL/min — AB (ref >90)
GFR, EST NON AFRICAN AMERICAN: 50 mL/min — AB (ref >90)
GLUCOSE: 76 mg/dL (ref 70–99)
POTASSIUM: 4.4 mmol/L (ref 3.5–5.1)
Sodium: 138 mmol/L (ref 135–145)

## 2014-07-07 MED ORDER — MEGESTROL ACETATE 40 MG PO TABS: 40.0000 mg | ORAL_TABLET | Freq: Every day | ORAL | Status: DC

## 2014-07-07 MED ORDER — MEGESTROL ACETATE 40 MG PO TABS
40.0000 mg | ORAL_TABLET | Freq: Every day | ORAL | Status: DC
  Administered 2014-07-07 – 2014-07-08 (×2): 40 mg via ORAL
  Filled 2014-07-07 (×2): qty 1

## 2014-07-07 MED ORDER — SENNOSIDES-DOCUSATE SODIUM 8.6-50 MG PO TABS
2.0000 | ORAL_TABLET | Freq: Two times a day (BID) | ORAL | Status: DC
  Administered 2014-07-07 – 2014-07-08 (×3): 2 via ORAL
  Filled 2014-07-07 (×4): qty 2

## 2014-07-07 MED ORDER — SENNOSIDES-DOCUSATE SODIUM 8.6-50 MG PO TABS: 2.0000 | ORAL_TABLET | Freq: Every day | ORAL | Status: AC

## 2014-07-07 MED ORDER — ACETAMINOPHEN 325 MG PO TABS: 325.0000 mg | ORAL_TABLET | Freq: Four times a day (QID) | ORAL | Status: AC | PRN

## 2014-07-07 MED ORDER — LACTATED RINGERS IV SOLN
INTRAVENOUS | Status: DC
  Administered 2014-07-07: 17:00:00 via INTRAVENOUS

## 2014-07-07 NOTE — Progress Notes (Addendum)
PROGRESS NOTE  Ian [name] ZOX:[id number] DOB: [birth year] DOA: 07/04/2014 PCP: No primary care provider on file.  HPI/Recap of past 24 hours: Demented elderly male laying in bed, NAD.  Less intermittent cough noted this morning, patient reported no appetite.   denies sob, no chest pain. Not oriented to time, but to place and person.   Assessment/Plan: Principal Problem:   Syncope and collapse Active Problems:   CAD (coronary artery disease)   Hypertension   History of CVA (cerebrovascular accident) 06/08/14   HLD (hyperlipidemia)   Acute renal failure   Bradycardia   Dehydration   Abnormal urinalysis  Cough: likely secondary to mild bronchitis, or decreased ability to clear secretion, passed swallow eval, lung exam right lower field rhonchi cleared with deep inspiration. Will provide incentive spirometer.  Syncope and collapse -continue telemetry, sinus rhythm with occasional pac's. -Suspect related to orthostasis in setting of dehydration and effects of antihypertensive medications -Although bradycardic, unclear if this contributing therefore we'll monitor activity while on telemetry -PT/OT evaluation -Hold offending medications since admission -received IV fluids initially, stopped on 2/20 am, continue hold bp meds. -encourage po intake, start megace on 2/21 Repeat orthostatic vital sign,sill positive, ivf with LR ordered on 2/21.    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 Echo without any evidence of embolic source. Neurology recommended 30 day heart monitor as outpatient at time of discharge, likely to monitor for arrhythmia such as atrial fibrillation -Noted with congestion involving the right lung as well as recurrent issues with upper airway congestion all of which are concerning for possible aspiration and possible dysphagia -speech therapy evaluate on 2/19 showed mild aspiration risk, recommend regular diet and thin liquid -X-ray unremarkable and no fever or leukocytosis so doubt has acute aspiration pneumonitis at this juncture -PT/OT evaluations this admission 1. 30 day heart monitor was recommended at time of discharge-not sure if this has been done-Will need to verify with SNF prior to DC. [facility] does not have a record of heart monitor done through them. Speech therapy recommends a regular diet and thin liquids.   Bradycardia -Was not a problem during the last admission -Suspect accumulation of carvedilol in setting of acute renal failure contributing therefore this medication placed on hold -Telemetry as above -Consider decrease dose of betablocker at discharge   CAD  -Patient denies issues with chest pain or shortness of breath with activity -Mild elevation in troponin at admission but this could be related to acute renal failure and recent hypotension i.e. demand ischemia   HLD  -Continue statin    Abnormal urinalysis -Could be related to volume depletion -urine culture insignificant  growth -No fever or leukocytosis and no indication for empiric antibiotics at this juncture     Code Status: full   Family Communication: none at bedside  Disposition Plan: snf   Consultants:  none  Procedures:  none  Antibiotics:  none   Objective: BP 135/65 mmHg  Pulse 55  Temp(Src) 98.3 F (36.8 C) (Oral)  Resp 16  Ht 5\' 9"  (1.753 m)  Wt 80.922 kg (178 lb 6.4 oz)  BMI 26.33 kg/m2  SpO2 99%  Intake/Output Summary (Last 24 hours) at 07/07/14 0906 Last data filed at 07/07/14 [id number]  Gross per 24 hour  Intake    120 ml  Output   1675 ml  Net  -1555 ml   Filed Weights   07/05/14 0514 07/06/14 0419 07/07/14 0517  Weight: 78.5 kg (173 lb 1 oz) 79.561 kg (175 lb 6.4 oz) 80.922 kg (178 lb 6.4 oz)    Exam: General: In no acute distress, appears healthy and well nourished  Psych: Flat affect, in no apparent Suicidal or Homicidal ideations, Awake Alert, Oriented X name only. Appears to have short-term memory deficits and some difficulty formulating words.  Neuro: No focal neurological deficits, CN II through XII intact, right lower extremity decreased strength 3/5. intentional tremor upper extremities.   ENT: Ears and Eyes appear Normal, Conjunctivae clear, PER. Moist oral mucosa without erythema or exudates.  Neck: Supple, No lymphadenopathy appreciated  Respiratory: Somewhat diminished left side posteriorly, less rhonchi noted right side posteriorly, rhonchi cleared with deep inspiration, room air with saturations 100%  Cardiac: RRR, No Murmurs, no LE edema noted, no JVD, No carotid bruits, peripheral pulses palpable at 2+, bradycardia heart rate 50-53  Abdomen: Positive bowel sounds, Soft, Non tender, Non distended, No masses appreciated, no obvious hepatosplenomegaly  Skin: No Cyanosis, Normal Skin Turgor, No Skin Rash or Bruise.  Extremities: Symmetrical  without obvious trauma or injury, no effusions.  Data Reviewed: Basic Metabolic Panel:  Recent Labs Lab 07/04/14 1400 07/05/14 0858 07/05/14 1914 07/06/14 0745 07/07/14 0347  NA 134* 136 138 138 138  K 5.1 5.5* 5.1 4.6 4.4  CL 110 106 107 109 107  CO2 15* 21 24 22 26   GLUCOSE 86 83 107* 75 76  BUN 55* 50* 40* 30* 16  CREATININE 2.33* 2.10* 1.84* 1.48* 1.37*  CALCIUM 7.3* 9.1 9.2 8.7 9.3   Liver Function Tests:  Recent Labs Lab 07/05/14 0858  AST 16  ALT 11  ALKPHOS 80  BILITOT 0.8  PROT 6.5  ALBUMIN 3.6   No results for input(s): LIPASE, AMYLASE in the last 168 hours. No results for input(s): AMMONIA in the last 168 hours. CBC:  Recent Labs Lab 07/04/14 1400 07/05/14 0600  WBC 5.1 7.9  NEUTROABS 2.4  --   HGB 13.1 15.7  HCT 40.0 46.8  MCV 69.8* 69.5*  PLT 207 PLATELET CLUMPS NOTED ON SMEAR, COUNT APPEARS ADEQUATE   Cardiac Enzymes:    Recent Labs Lab 07/04/14 1400 07/05/14 1241 07/05/14 1914 07/05/14 2325  TROPONINI 0.09* <0.03 <0.03 <0.03   BNP (last 3 results) No results for input(s): BNP in the last 8760 hours.  ProBNP (last 3 results) No results for input(s): PROBNP in the last 8760 hours.  CBG: No results for input(s): GLUCAP in the last 168 hours.  Recent Results (from the past 240 hour(s))  Urine culture     Status: None   Collection Time: 07/04/14  4:07 PM  Result Value Ref Range Status   Specimen Description URINE,  CATHETERIZED  Final   Special Requests NONE  Final   Colony Count   Final    50,000 COLONIES/ML Performed at [facility]    Culture   Final    Multiple bacterial morphotypes present, none predominant. Suggest appropriate recollection if clinically indicated. Performed at [facility]    Report Status 07/05/2014 FINAL  Final     Studies: No results found.  Scheduled Meds: . aspirin EC  81 mg Oral Daily  . clopidogrel  75 mg Oral Daily  . folic acid  1 mg Oral Daily  . heparin  5,000 Units  Subcutaneous 3 times per day  . megestrol  40 mg Oral Daily  . multivitamin with minerals  1 tablet Oral Daily  . simvastatin  40 mg Oral QHS  . sodium chloride  3 mL Intravenous Q12H  . thiamine  200 mg Oral Daily    Continuous Infusions: stopped von 2/20     Frisco Cordts  [facility] Pager [phone]. If 7PM-7AM, please contact night-coverage at [email or web address], password [facility] 07/07/2014, 9:06 AM  LOS: 3 days

## 2014-07-07 NOTE — Discharge Summary (Signed)
Discharge Summary  Ian [name] HQI:[id number] DOB: 1940/06/01  PCP: No primary care provider on file.  Admit date: 07/04/2014 Discharge date: 07/08/2014  Time spent: less than  Recommendations for Outpatient Follow-up:  1. F/u with cardiology ([facility])to schedule outpatient holter monitor  2. F/u with pmd, pmd to arrange neurology follow up (h/o embolic cva in 06/2014), pmd to repeat bmp in 1wk.  Discharge Diagnoses:  Active Hospital Problems   Diagnosis Date Noted  . Syncope and collapse 07/04/2014  . Acute renal failure 07/04/2014  . Bradycardia 07/04/2014  . Dehydration 07/04/2014  . Abnormal urinalysis 07/04/2014  . HLD (hyperlipidemia)   . History of CVA (cerebrovascular accident) 06/08/14 06/08/2014  . Hypertension 06/07/2014  . CAD (coronary artery disease)     Resolved Hospital Problems   Diagnosis Date Noted Date Resolved  No resolved problems to display.    Discharge Condition: stable  Diet recommendation: heart healthy  Filed Weights   07/06/14 0419 07/07/14 0517 07/08/14 0547  Weight: 79.561 kg (175 lb 6.4 oz) 80.922 kg (178 lb 6.4 oz) 81 kg (178 lb 9.2 oz)    History of present illness:  Ian [name] is a 74 y.o. male, who was recently discharged to rehabilitation and subsequently to skilled nursing facility after treatment for acute encephalopathy and a cerebrovascular accident. Prior to that admission patient had been living at home and apparently had been experiencing issues related to alcohol abuse. It was felt that Ian [name] encephalopathy may also have been contributing to his neurological decline. During that hospitalization he was started on dual antiplatelet therapy with aspirin and Plavix to continue for 3 months and then continue with Plavix only after that time. Since discharge to the skilled nursing facility (according to the patient's brother who is at the bedside) the patient has been doing relatively well. He is been working with PT.  Prior to this admission while working with PT patient apparently was asymptomatic during the therapy session but once he was walked back to his room he had syncopal episodes 2 occasions. EMS was called. He was found to be hypotensive with a blood pressure 77/55. His CBG was stable at 108. Patient was given a 500 mL fluid challenge in the field.  Upon arrival to the emergency department his blood pressure was 91/53 his heart rate was 52. His BUN was 55 with a creatinine of 2.33 noting at time of discharge his BUN was 10 and his creatinine was 0.99. His troponin today was slightly elevated at 0.09. Chest x-ray was unremarkable. Telemetry revealed sinus bradycardia. EKG revealed sinus bradycardia with a heart rate of 51, QTC 403 ms and slightly spiked T waves. When questioning the patient has no specific complaints. He seems to be somewhat of a poor historian and this is likely related to his recent CVA. He was able to tell me that his legs do ache somewhat with walking on a regular basis. Brother is at the bedside and states that the patient appears to be improving from a mentation standpoint when compared to recent hospitalization.   Hospital Course:  Principal Problem:   Syncope and collapse Active Problems:   CAD (coronary artery disease)   Hypertension   History of CVA (cerebrovascular accident) 06/08/14   HLD (hyperlipidemia)   Acute renal failure   Bradycardia   Dehydration   Abnormal urinalysis  Cough: likely secondary to mild bronchitis, or decreased ability to clear secretion, passed swallow eval, lung exam right lower field rhonchi cleared with deep inspiration. cxr  unremarkable, no fever, no leukocytosis, advise incentive spirometer at SNF, augmentin/mucinex for 5days to cover possible bronchitis, patient reported green sputum, no sure if reliable, lung exam persistent mild rhonchi right lower lobe.  Syncope and collapse -on telemetry in hospital, sinus rhythm with occasional  pac's. -Suspect related to orthostasis in setting of dehydration and effects of antihypertensive medications -Although bradycardic, unclear if this contributing therefore we'll monitor activity while on telemetry -PT/OT evaluation -Hold offending medications since admission -received IV fluids initially, stopped on 2/20 am, restarted on 2/21 due to repeat positive orthostatic vital sign. continue hold bp meds. -encourage po intake, start megace on 2/21 _repeat orthostatic vital signs on 2/22, much improved.    Acute renal failure/ Dehydration/mild hyperkamia -Was on thiazide diuretic and ACE inhibitor prior to admission so we'll discontinue -Also had episode of hypotension so likely hypoperfusion also contributing to acute renal failure  -Baseline renal function 10/0.99 -Current renal function 55/2.33 -s/p IV fluid hydration -labs improving, but not baseline yet, encourage po intake, restarted ivf on 2/21 Cr 1.36 on 2/22, new baseline? PMD to repeat bmp in 1wk.   Hypertension -admitted with orthostatic hypotension, all antihypertensive medications has been on hold since admission. bp improved with hydration, does has intermittent bradycardia in to the 50's at times. bp meds adjusted at time of discharge. Decrease coreg dose to 3.125, stopp lisinopril, resume norvasc. Further bp meds titration per pmd.   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 Echo without any  evidence of embolic source. Neurology recommended 30 day heart monitor as outpatient , likely to monitor for arrhythmia such as atrial fibrillation -Noted with congestion involving the right lung as well as recurrent issues with upper airway congestion all of which are concerning for possible aspiration and possible dysphagia -speech therapy evaluate on 2/19 showed mild aspiration risk, recommend regular diet and thin liquid -X-ray unremarkable and no fever or leukocytosis  -PT/OT evaluations this admission 30 day heart monitor to be arranged by SNF with [facility].  Bradycardia -tsh wnl, Was not a problem during the last admission -Suspect accumulation of carvedilol in setting of acute renal failure contributing therefore this medication placed on hold Consider decrease dose of betablocker at discharge -outpatient cardiac monitor to be arranged by SNF    CAD  -Patient denies issues with chest pain or shortness of breath with activity -Mild elevation in troponin at admission but this could be related to acute renal failure and recent hypotension i.e. demand ischemia On coreg/asa/plavix/statin   HLD  -Continue statin   Abnormal urinalysis -Could be related to volume depletion -urine culture insignificant growth -No fever or leukocytosis and no indication for empiric antibiotics at this juncture     Procedures:  none  Consultations:  none  Discharge Exam: BP 148/84 mmHg  Pulse 63  Temp(Src) 98 F (36.7 C) (Oral)  Resp 18  Ht  (1.753 m)  Wt 81 kg (178 lb 9.2 oz)  BMI 26.36 kg/m2  SpO2 100%  General: In no acute distress, frail, demented at baseline, oriented to self, intermittent  oriented to place, calm and following commend.   Psych: Flat affect, pshycomotor retardation, some difficulty formulating words.  Neuro: CN II through XII intact, right lower extremity decreased strength 3/5. intentional tremor upper extremities.   ENT: Ears and Eyes  appear Normal, Conjunctivae clear, PER. Moist oral mucosa without erythema or exudates.  Neck: Supple, No lymphadenopathy appreciated  Respiratory: Somewhat diminished left side posteriorly, less rhonchi noted right side posteriorly, rhonchi cleared with deep inspiration, room air with saturations 100%  Cardiac: RRR, No Murmurs, no LE edema noted, no JVD, No carotid bruits, peripheral pulses palpable at 2+, bradycardia heart rate 50-53  Abdomen: Positive bowel sounds, Soft, Non tender, Non distended, No masses appreciated, no obvious hepatosplenomegaly  Skin: No Cyanosis, Normal Skin Turgor, No Skin Rash or Bruise.  Extremities: Symmetrical without obvious trauma or injury, no effusions.   Discharge Instructions You were cared for by a hospitalist during your hospital stay. If you have any questions about your discharge medications or the care you received while you were in the hospital after you are discharged, you can call the unit and asked to speak with the hospitalist on call if the hospitalist that took care of you is not available. Once you are discharged, your primary care physician will handle any further medical issues. Please note that NO REFILLS for any discharge medications will be authorized once you are discharged, as it is imperative that you return to your primary care physician (or establish a relationship with a primary care physician if you do not have one) for your aftercare needs so that they can reassess your need for medications and monitor your lab values.     Medication List    STOP taking these medications        hydrochlorothiazide 25 MG tablet  Commonly known as:  HYDRODIURIL     lisinopril 20 MG tablet  Commonly known as:  PRINIVIL,ZESTRIL      TAKE these medications        acetaminophen 325 MG tablet  Commonly known as:  TYLENOL  Take 1 tablet (325 mg total) by mouth every 6 (six) hours as needed for mild pain (or Fever >/= 101).      amLODipine 5 MG tablet  Commonly known as:  NORVASC  TAKE ONE TABLET BY MOUTH EVERY DAY     amoxicillin-clavulanate 875-125 MG per tablet  Commonly known as:  AUGMENTIN  Take 1 tablet by mouth 2 (two) times daily.     aspirin 81 MG EC tablet  Take 1 tablet (81 mg total) by mouth daily.     carvedilol 3.125 MG tablet  Commonly known as:  COREG  Take 1 tablet (3.125 mg total) by mouth 2 (two) times daily with a meal.     clopidogrel 75 MG tablet  Commonly known as:  PLAVIX  Take 1 tablet (75 mg total) by mouth daily.     FLUoxetine 20 MG tablet  Commonly known as:  PROZAC  Take 1 tablet (20 mg total) by mouth daily.     folic acid 1 MG tablet  Commonly known as:  FOLVITE  Take 1 tablet (1 mg total) by mouth daily.     guaiFENesin 600 MG 12 hr tablet  Commonly known as:  MUCINEX  Take 1 tablet (600 mg total) by mouth 2 (two) times daily.     megestrol 40 MG tablet  Commonly known as:  MEGACE  Take 1 tablet (40 mg total) by mouth daily.  multivitamin tablet  Take 1 tablet by mouth daily.     senna-docusate 8.6-50 MG per tablet  Commonly known as:  Senokot-S  Take 2 tablets by mouth at bedtime.     simvastatin 40 MG tablet  Commonly known as:  ZOCOR  Take 1 tablet (40 mg total) by mouth at bedtime.     thiamine 100 MG tablet  Commonly known as:  VITAMIN B-1  Take 2 tablets (200 mg total) by mouth daily.       No Known Allergies Follow-up Information    Follow up with MCALHANY,CHRISTOPHER, MD In 1 week.   Specialty:  Cardiology   Contact information:   [address]  [address] [place] [address] [phone]        The results of significant diagnostics from this hospitalization (including imaging, microbiology, ancillary and laboratory) are listed below for reference.    Significant Diagnostic Studies: Ct Head Wo Contrast  07/04/2014   CLINICAL DATA:  Syncopal episode today after physical therapy, history of recent stroke with vertebral artery  occlusion, hurts all over  EXAM: CT HEAD WITHOUT CONTRAST  TECHNIQUE: Contiguous axial images were obtained from the base of the skull through the vertex without intravenous contrast.  COMPARISON:  06/07/2014  FINDINGS: The bony calvarium is intact. No gross soft tissue abnormality is noted. Chronic atrophic changes and chronic white matter ischemic change is seen. There are changes consistent with prior lacunar infarct in the left centrum semiovale. No acute ischemic changes are seen.  IMPRESSION: Chronic atrophic and ischemic changes.  No acute abnormality noted.   Electronically Signed   By: Alcide Clever M.D.   On: 07/04/2014 15:12   Ct Angio Neck W/cm &/or Wo/cm  06/09/2014   CLINICAL DATA:  Initial evaluation for stroke.  EXAM: CT ANGIOGRAPHY NECK  TECHNIQUE: Multidetector CT imaging of the neck was performed using the standard protocol during bolus administration of intravenous contrast. Multiplanar CT image reconstructions and MIPs were obtained to evaluate the vascular anatomy. Carotid stenosis measurements (when applicable) are obtained utilizing NASCET criteria, using the distal internal carotid diameter as the denominator.  CONTRAST:  50 cc of OMNIPAQUE IOHEXOL 350 MG/ML SOLN  COMPARISON:  Prior MRI from 06/07/2014.  FINDINGS: Aortic arch: Visualize aortic arch is of normal caliber with normal 3 vessel morphology. Moderate atheromatous plaque present within the aortic arch itself. There is noncalcified atheromatous plaque at the origin of the great vessels without associated hemodynamically significant stenosis. Subclavian arteries widely patent bilaterally. Incidental note made of a right-sided central venous catheter with tip terminating in the distal SVC.  Right carotid system: The right common carotid artery is well opacified to the level of the carotid bifurcation. Calcified and noncalcified plaque at the carotid bifurcation/proximal right internal carotid artery present with resultant stenosis of  approximately 40% by NASCET criteria. Distally, the right internal carotid artery is tortuous but well opacified to the level of the skullbase. Prominent atheromatous plaque present within the visualized cavernous right ICA.  Right external carotid arteries and its branch vessels grossly normal.  Left carotid system: Left common carotid artery well opacified to the level of the carotid bifurcation. Minimal atheromatous plaque present about the left carotid bifurcation and proximal left internal carotid artery without significant stenosis. Left ICA is mildly tortuous with medialization into the retropharyngeal space. No hemodynamically significant stenosis, dissection, or vascular occlusion identified within left ICA.  Left external carotid artery and its branches within normal limits.  Vertebral arteries:Both vertebral arteries appear  occluded from their origin from the subclavian arteries. There is some distal reconstitution via muscular branches at the level of C2-3 (series 401, image 90). The reconstituted vertebral arteries are markedly small and attenuated in appearance. Moderate to high-grade stenosis of the left V4 segment proximal to the left posterior inferior cerebral artery again noted. Distally, the left vertebral artery is diminutive with poor flow.  Visualized lungs are clear. No soft tissue abnormality within the neck. Thyroid gland is normal. No adenopathy.  Advanced multilevel degenerative disc disease noted within the visualized cervical spine. No acute osseus abnormality. No worrisome lytic or blastic osseous lesions.  IMPRESSION: 1. 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 There is some distal reconstitution of the V2 segments at the level of C2-3 via muscular branches. Distally, the vertebral arteries are diminutive in appearance. 2. Moderate to high-grade stenosis within the distal left V4 segment, similar as described on previous MRA. 3. Calcified plaque  at the right carotid bifurcation/proximal right ICA with associated short segment stenosis of approximately 40% by NASCET criteria. 4. No other hemodynamically significant stenosis identified within the neck.   Electronically Signed   By: Rise Mu M.D.   On: 06/09/2014 00:32   Dg Chest Portable 1 View  07/04/2014   CLINICAL DATA:  Hypotension, weakness and chest pain today. Questionable loss of consciousness today. History of hypertension and CABG surgery.  EXAM: PORTABLE CHEST - 1 VIEW  COMPARISON:  06/08/2014  FINDINGS: Changes from CABG surgery are stable. Cardiac silhouette is mildly enlarged. No mediastinal or hilar masses or evidence of adenopathy.  Clear lungs.  No pleural effusion or pneumothorax.  Bony thorax is intact.  IMPRESSION: No acute cardiopulmonary disease.   Electronically Signed   By: Amie Portland M.D.   On: 07/04/2014 16:04    Microbiology: Recent Results (from the past 240 hour(s))  Urine culture     Status: None   Collection Time: 07/04/14  4:07 PM  Result Value Ref Range Status   Specimen Description URINE, CATHETERIZED  Final   Special Requests NONE  Final   Colony Count   Final    50,000 COLONIES/ML Performed at [facility]    Culture   Final    Multiple bacterial morphotypes present, none predominant. Suggest appropriate recollection if clinically indicated. Performed at [facility]    Report Status 07/05/2014 FINAL  Final     Labs: Basic Metabolic Panel:  Recent Labs Lab 07/05/14 0858 07/05/14 1914 07/06/14 0745 07/07/14 0347 07/08/14 0433  NA 136 138 138 138 136  K 5.5* 5.1 4.6 4.4 3.5  CL 106 107 109 107 106  CO2 21 24 22 26 25   GLUCOSE 83 107* 75 76 95  BUN 50* 40* 30* 16 13  CREATININE 2.10* 1.84* 1.48* 1.37* 1.36*  CALCIUM 9.1 9.2 8.7 9.3 8.8   Liver Function Tests:  Recent Labs Lab 07/05/14 0858  AST 16  ALT 11  ALKPHOS 80  BILITOT 0.8  PROT 6.5  ALBUMIN 3.6   No results for input(s): LIPASE,  AMYLASE in the last 168 hours. No results for input(s): AMMONIA in the last 168 hours. CBC:  Recent Labs Lab 07/04/14 1400 07/05/14 0600  WBC 5.1 7.9  NEUTROABS 2.4  --   HGB 13.1 15.7  HCT 40.0 46.8  MCV 69.8* 69.5*  PLT 207 PLATELET CLUMPS NOTED ON SMEAR, COUNT APPEARS ADEQUATE   Cardiac Enzymes:  Recent Labs Lab 07/04/14 1400 07/05/14 1241 07/05/14 1914  07/05/14 2325  TROPONINI 0.09* <0.03 <0.03 <0.03   BNP: BNP (last 3 results) No results for input(s): BNP in the last 8760 hours.  ProBNP (last 3 results) No results for input(s): PROBNP in the last 8760 hours.  CBG: No results for input(s): GLUCAP in the last 168 hours.     Signed:  Tiron Suski  [facility] 07/08/2014, 11:33 AM

## 2014-07-08 DIAGNOSIS — R55 Syncope and collapse: Secondary | ICD-10-CM | POA: Diagnosis not present

## 2014-07-08 DIAGNOSIS — I679 Cerebrovascular disease, unspecified: Secondary | ICD-10-CM | POA: Diagnosis not present

## 2014-07-08 DIAGNOSIS — M6281 Muscle weakness (generalized): Secondary | ICD-10-CM | POA: Diagnosis not present

## 2014-07-08 DIAGNOSIS — Z8673 Personal history of transient ischemic attack (TIA), and cerebral infarction without residual deficits: Secondary | ICD-10-CM | POA: Diagnosis not present

## 2014-07-08 DIAGNOSIS — E139 Other specified diabetes mellitus without complications: Secondary | ICD-10-CM | POA: Diagnosis not present

## 2014-07-08 DIAGNOSIS — I10 Essential (primary) hypertension: Secondary | ICD-10-CM

## 2014-07-08 DIAGNOSIS — R41841 Cognitive communication deficit: Secondary | ICD-10-CM | POA: Diagnosis not present

## 2014-07-08 DIAGNOSIS — I6789 Other cerebrovascular disease: Secondary | ICD-10-CM | POA: Diagnosis not present

## 2014-07-08 DIAGNOSIS — R278 Other lack of coordination: Secondary | ICD-10-CM | POA: Diagnosis not present

## 2014-07-08 DIAGNOSIS — N179 Acute kidney failure, unspecified: Secondary | ICD-10-CM | POA: Diagnosis not present

## 2014-07-08 DIAGNOSIS — R488 Other symbolic dysfunctions: Secondary | ICD-10-CM | POA: Diagnosis not present

## 2014-07-08 DIAGNOSIS — G9340 Encephalopathy, unspecified: Secondary | ICD-10-CM | POA: Diagnosis not present

## 2014-07-08 DIAGNOSIS — E860 Dehydration: Secondary | ICD-10-CM | POA: Diagnosis not present

## 2014-07-08 DIAGNOSIS — R1311 Dysphagia, oral phase: Secondary | ICD-10-CM | POA: Diagnosis not present

## 2014-07-08 DIAGNOSIS — R262 Difficulty in walking, not elsewhere classified: Secondary | ICD-10-CM | POA: Diagnosis not present

## 2014-07-08 DIAGNOSIS — R001 Bradycardia, unspecified: Secondary | ICD-10-CM | POA: Diagnosis not present

## 2014-07-08 DIAGNOSIS — G459 Transient cerebral ischemic attack, unspecified: Secondary | ICD-10-CM | POA: Diagnosis not present

## 2014-07-08 DIAGNOSIS — I639 Cerebral infarction, unspecified: Secondary | ICD-10-CM | POA: Diagnosis not present

## 2014-07-08 DIAGNOSIS — F0390 Unspecified dementia without behavioral disturbance: Secondary | ICD-10-CM | POA: Diagnosis not present

## 2014-07-08 LAB — BASIC METABOLIC PANEL
Anion gap: 5 (ref 5–15)
BUN: 13 mg/dL (ref 6–23)
CO2: 25 mmol/L (ref 19–32)
Calcium: 8.8 mg/dL (ref 8.4–10.5)
Chloride: 106 mmol/L (ref 96–112)
Creatinine, Ser: 1.36 mg/dL — ABNORMAL HIGH (ref 0.50–1.35)
GFR calc Af Amer: 58 mL/min — ABNORMAL LOW (ref >90)
GFR calc non Af Amer: 50 mL/min — ABNORMAL LOW (ref >90)
Glucose, Bld: 95 mg/dL (ref 70–99)
Potassium: 3.5 mmol/L (ref 3.5–5.1)
Sodium: 136 mmol/L (ref 135–145)

## 2014-07-08 MED ORDER — CARVEDILOL 3.125 MG PO TABS: 3.1250 mg | ORAL_TABLET | Freq: Two times a day (BID) | ORAL | Status: AC

## 2014-07-08 MED ORDER — AMOXICILLIN-POT CLAVULANATE 875-125 MG PO TABS: 1.0000 | ORAL_TABLET | Freq: Two times a day (BID) | ORAL | Status: DC

## 2014-07-08 MED ORDER — GUAIFENESIN ER 600 MG PO TB12: 600.0000 mg | ORAL_TABLET | Freq: Two times a day (BID) | ORAL | Status: AC

## 2014-07-08 NOTE — Clinical Social Work Note (Signed)
[facility] Anticipated discharge date:07/08/14 Family notified:Mrs. Su HiltRoberts- pt sister Transportation by [employer]  CSW signing off.  Merlyn LotJenna Holoman, LCSWA Clinical Social Worker [phone]

## 2014-07-08 NOTE — Progress Notes (Signed)
Physical Therapy Treatment Patient Details Name: Ian [name] MRN: [id number] DOB: 1940-11-09 Today's Date: 07/08/2014    History of Present Illness 74 year old male with history of CAD, HLD, HTN, BPH, hospitalized 06/07/14-06/11/14 for acute encephalopathy related to acute CVA complicating underlying dementia and possible Wernicke's encephalopathy. He was doing relatively well at SNF until 2/18 when he had syncopal episodes 2. EMS found him to be hypotensive with blood pressures in the 77/55 mmHg. He was admitted for syncope, hypotension, dehydration and acute renal failure.    PT Comments    Patient progressing towards physical therapy goals. Requires min assist to ambulate up to 90 feet with a rolling walker for support today. HR 80s-90s, SpO2 100% on room air. Tolerating therapeutic exercises well. Patient will continue to benefit from skilled physical therapy services to further improve independence with functional mobility.   Follow Up Recommendations  SNF;Supervision/Assistance - 24 hour     Equipment Recommendations  None recommended by PT    Recommendations for Other Services       Precautions / Restrictions Precautions Precautions: Fall Restrictions Weight Bearing Restrictions: No    Mobility  Bed Mobility Overal bed mobility: Needs Assistance Bed Mobility: Supine to Sit     Supine to sit: Min guard     General bed mobility comments: Close guard for safety. Requires extra time. Performed from lowest bed setting.  Transfers Overall transfer level: Needs assistance Equipment used: Rolling walker (2 wheeled) Transfers: Sit to/from Stand Sit to Stand: Min guard         General transfer comment: Min guard for safety. VC for hand placement.  no loss of balance upon stnading with UE support from RW.  Ambulation/Gait Ambulation/Gait assistance: Min assist Ambulation Distance (Feet): 90 Feet Assistive device: Rolling walker (2 wheeled) Gait Pattern/deviations:  Decreased stride length;Step-through pattern;Decreased step length - left;Decreased stance time - right;Decreased dorsiflexion - right;Trendelenburg;Drifts right/left;Trunk flexed Gait velocity: decereased   General Gait Details: Min guard for majority of bout for safety with intermittent physical assist for walker control. Focused on Rt foot clearance with frequent cues, and attempting heel strike on Rt. No loss of balance during ambulatory bout. Very slow and requires multiple cues for instructions.   Stairs            Wheelchair Mobility    Modified Rankin (Stroke Patients Only)       Balance                                    Cognition Arousal/Alertness: Awake/alert Behavior During Therapy: Flat affect;Impulsive Overall Cognitive Status: No family/caregiver present to determine baseline cognitive functioning       Memory: Decreased recall of precautions;Decreased short-term memory              Exercises General Exercises - Lower Extremity Ankle Circles/Pumps: Both;10 reps;Seated;AAROM Quad Sets: Strengthening;Both;10 reps;Seated Long Arc Quad: Strengthening;Both;10 reps;Seated Heel Slides: AAROM;Strengthening;Both;10 reps;Seated Hip Flexion/Marching: Strengthening;Both;10 reps;Seated    General Comments General comments (skin integrity, edema, etc.): SpO2 100% on room air. HR 80s-90s      Pertinent Vitals/Pain Pain Assessment: No/denies pain  Assisted nursing with pt safety in sitting/standing while taking orthostatic vitals which are recorded in vitals section.     Home Living                      Prior Function  PT Goals (current goals can now be found in the care plan section) Acute Rehab PT Goals Patient Stated Goal: none stated PT Goal Formulation: With patient Time For Goal Achievement: 07/19/14 Potential to Achieve Goals: Fair Progress towards PT goals: Progressing toward goals    Frequency  Min  2X/week    PT Plan Current plan remains appropriate    Co-evaluation             End of Session Equipment Utilized During Treatment: Gait belt Activity Tolerance: Patient tolerated treatment well Patient left: in chair;with chair alarm set;with call bell/phone within reach     Time: 1012-1039 PT Time Calculation (min) (ACUTE ONLY): 27 min  Charges:  $Gait Training: 8-22 mins $Therapeutic Exercise: 8-22 mins                    G Codes:      Berton MountBarbour, Zilah Villaflor S 07/08/2014, 11:23 AM Charlsie MerlesLogan Secor Hollis Tuller, PT [phone]

## 2014-07-08 NOTE — Care Management Note (Unsigned)
    Page 1 of 1   07/08/2014     2:53:42 PM CARE MANAGEMENT NOTE 07/08/2014  Patient:  Rufus,Schon   Account Number:  [id number]  Date Initiated:  07/08/2014  Documentation initiated by:  Gae GallopOLE,ANGELA  Subjective/Objective Assessment:   From  [facility] admitted with syncope/fall.     Action/Plan:   [facility]   Anticipated DC Date:  07/09/2014   Anticipated DC Plan:  ASSISTED LIVING / REST HOME         Choice offered to / List presented to:             Status of service:  In process, will continue to follow Medicare Important Message given?  YES (If response is "NO", the following Medicare IM given date fields will be blank) Date Medicare IM given:  07/08/2014 Medicare IM given by:  Gae GallopOLE,ANGELA Date Additional Medicare IM given:   Additional Medicare IM given by:    Discharge Disposition:  ASSISTED LIVING  Per UR Regulation:  Reviewed for med. necessity/level of care/duration of stay  If discussed at Long Length of Stay Meetings, dates discussed:   07/09/2014    Comments:

## 2014-07-08 NOTE — Progress Notes (Signed)
Medicare Important Message given? YES (If response is "NO", the following Medicare IM given date fields will be blank) Date Medicare IM given:07/08/2014 Medicare IM given by: Gae GallopOLE,Khushbu Pippen

## 2014-07-08 NOTE — Progress Notes (Signed)
Pt discharge [facility]. [facility].

## 2014-07-25 DIAGNOSIS — F0390 Unspecified dementia without behavioral disturbance: Secondary | ICD-10-CM | POA: Diagnosis not present

## 2014-07-25 DIAGNOSIS — I639 Cerebral infarction, unspecified: Secondary | ICD-10-CM | POA: Diagnosis not present

## 2014-07-25 DIAGNOSIS — G9340 Encephalopathy, unspecified: Secondary | ICD-10-CM | POA: Diagnosis not present

## 2014-07-25 DIAGNOSIS — E139 Other specified diabetes mellitus without complications: Secondary | ICD-10-CM | POA: Diagnosis not present

## 2014-09-05 ENCOUNTER — Ambulatory Visit: Payer: Self-pay | Admitting: Neurology

## 2014-10-09 DIAGNOSIS — I69328 Other speech and language deficits following cerebral infarction: Secondary | ICD-10-CM | POA: Diagnosis not present

## 2014-10-09 DIAGNOSIS — R262 Difficulty in walking, not elsewhere classified: Secondary | ICD-10-CM | POA: Diagnosis not present

## 2014-10-09 DIAGNOSIS — M6281 Muscle weakness (generalized): Secondary | ICD-10-CM | POA: Diagnosis not present

## 2014-10-09 DIAGNOSIS — R1311 Dysphagia, oral phase: Secondary | ICD-10-CM | POA: Diagnosis not present

## 2014-10-09 DIAGNOSIS — I639 Cerebral infarction, unspecified: Secondary | ICD-10-CM | POA: Diagnosis not present

## 2014-10-09 DIAGNOSIS — R278 Other lack of coordination: Secondary | ICD-10-CM | POA: Diagnosis not present

## 2014-10-09 DIAGNOSIS — I6931 Cognitive deficits following cerebral infarction: Secondary | ICD-10-CM | POA: Diagnosis not present

## 2014-10-09 DIAGNOSIS — R488 Other symbolic dysfunctions: Secondary | ICD-10-CM | POA: Diagnosis not present

## 2014-10-09 DIAGNOSIS — G9349 Other encephalopathy: Secondary | ICD-10-CM | POA: Diagnosis not present

## 2014-10-09 DIAGNOSIS — R55 Syncope and collapse: Secondary | ICD-10-CM | POA: Diagnosis not present

## 2014-10-09 DIAGNOSIS — G459 Transient cerebral ischemic attack, unspecified: Secondary | ICD-10-CM | POA: Diagnosis not present

## 2014-10-09 DIAGNOSIS — I679 Cerebrovascular disease, unspecified: Secondary | ICD-10-CM | POA: Diagnosis not present

## 2014-10-09 DIAGNOSIS — R41841 Cognitive communication deficit: Secondary | ICD-10-CM | POA: Diagnosis not present

## 2014-10-10 DIAGNOSIS — I639 Cerebral infarction, unspecified: Secondary | ICD-10-CM | POA: Diagnosis not present

## 2014-10-10 DIAGNOSIS — R1311 Dysphagia, oral phase: Secondary | ICD-10-CM | POA: Diagnosis not present

## 2014-10-10 DIAGNOSIS — R41841 Cognitive communication deficit: Secondary | ICD-10-CM | POA: Diagnosis not present

## 2014-10-10 DIAGNOSIS — R55 Syncope and collapse: Secondary | ICD-10-CM | POA: Diagnosis not present

## 2014-10-10 DIAGNOSIS — G459 Transient cerebral ischemic attack, unspecified: Secondary | ICD-10-CM | POA: Diagnosis not present

## 2014-10-10 DIAGNOSIS — I679 Cerebrovascular disease, unspecified: Secondary | ICD-10-CM | POA: Diagnosis not present

## 2014-10-11 DIAGNOSIS — G459 Transient cerebral ischemic attack, unspecified: Secondary | ICD-10-CM | POA: Diagnosis not present

## 2014-10-11 DIAGNOSIS — R41841 Cognitive communication deficit: Secondary | ICD-10-CM | POA: Diagnosis not present

## 2014-10-11 DIAGNOSIS — R1311 Dysphagia, oral phase: Secondary | ICD-10-CM | POA: Diagnosis not present

## 2014-10-11 DIAGNOSIS — R55 Syncope and collapse: Secondary | ICD-10-CM | POA: Diagnosis not present

## 2014-10-11 DIAGNOSIS — I679 Cerebrovascular disease, unspecified: Secondary | ICD-10-CM | POA: Diagnosis not present

## 2014-10-11 DIAGNOSIS — I639 Cerebral infarction, unspecified: Secondary | ICD-10-CM | POA: Diagnosis not present

## 2014-10-14 DIAGNOSIS — R41841 Cognitive communication deficit: Secondary | ICD-10-CM | POA: Diagnosis not present

## 2014-10-14 DIAGNOSIS — I639 Cerebral infarction, unspecified: Secondary | ICD-10-CM | POA: Diagnosis not present

## 2014-10-14 DIAGNOSIS — R1311 Dysphagia, oral phase: Secondary | ICD-10-CM | POA: Diagnosis not present

## 2014-10-14 DIAGNOSIS — I679 Cerebrovascular disease, unspecified: Secondary | ICD-10-CM | POA: Diagnosis not present

## 2014-10-14 DIAGNOSIS — R55 Syncope and collapse: Secondary | ICD-10-CM | POA: Diagnosis not present

## 2014-10-14 DIAGNOSIS — G459 Transient cerebral ischemic attack, unspecified: Secondary | ICD-10-CM | POA: Diagnosis not present

## 2014-10-15 DIAGNOSIS — G459 Transient cerebral ischemic attack, unspecified: Secondary | ICD-10-CM | POA: Diagnosis not present

## 2014-10-15 DIAGNOSIS — R1311 Dysphagia, oral phase: Secondary | ICD-10-CM | POA: Diagnosis not present

## 2014-10-15 DIAGNOSIS — R55 Syncope and collapse: Secondary | ICD-10-CM | POA: Diagnosis not present

## 2014-10-15 DIAGNOSIS — R41841 Cognitive communication deficit: Secondary | ICD-10-CM | POA: Diagnosis not present

## 2014-10-15 DIAGNOSIS — I679 Cerebrovascular disease, unspecified: Secondary | ICD-10-CM | POA: Diagnosis not present

## 2014-10-15 DIAGNOSIS — I639 Cerebral infarction, unspecified: Secondary | ICD-10-CM | POA: Diagnosis not present

## 2014-10-16 DIAGNOSIS — R1311 Dysphagia, oral phase: Secondary | ICD-10-CM | POA: Diagnosis not present

## 2014-10-16 DIAGNOSIS — Z9181 History of falling: Secondary | ICD-10-CM | POA: Diagnosis not present

## 2014-10-16 DIAGNOSIS — R2689 Other abnormalities of gait and mobility: Secondary | ICD-10-CM | POA: Diagnosis not present

## 2014-10-16 DIAGNOSIS — M6281 Muscle weakness (generalized): Secondary | ICD-10-CM | POA: Diagnosis not present

## 2014-10-17 DIAGNOSIS — M6281 Muscle weakness (generalized): Secondary | ICD-10-CM | POA: Diagnosis not present

## 2014-10-17 DIAGNOSIS — R2689 Other abnormalities of gait and mobility: Secondary | ICD-10-CM | POA: Diagnosis not present

## 2014-10-17 DIAGNOSIS — R1311 Dysphagia, oral phase: Secondary | ICD-10-CM | POA: Diagnosis not present

## 2014-10-17 DIAGNOSIS — I639 Cerebral infarction, unspecified: Secondary | ICD-10-CM | POA: Diagnosis not present

## 2014-10-17 DIAGNOSIS — I2510 Atherosclerotic heart disease of native coronary artery without angina pectoris: Secondary | ICD-10-CM | POA: Diagnosis not present

## 2014-10-17 DIAGNOSIS — Z9181 History of falling: Secondary | ICD-10-CM | POA: Diagnosis not present

## 2014-10-17 DIAGNOSIS — F324 Major depressive disorder, single episode, in partial remission: Secondary | ICD-10-CM | POA: Diagnosis not present

## 2014-10-17 DIAGNOSIS — F0391 Unspecified dementia with behavioral disturbance: Secondary | ICD-10-CM | POA: Diagnosis not present

## 2014-10-18 DIAGNOSIS — R2689 Other abnormalities of gait and mobility: Secondary | ICD-10-CM | POA: Diagnosis not present

## 2014-10-18 DIAGNOSIS — Z9181 History of falling: Secondary | ICD-10-CM | POA: Diagnosis not present

## 2014-10-18 DIAGNOSIS — M6281 Muscle weakness (generalized): Secondary | ICD-10-CM | POA: Diagnosis not present

## 2014-10-18 DIAGNOSIS — R1311 Dysphagia, oral phase: Secondary | ICD-10-CM | POA: Diagnosis not present

## 2014-10-21 DIAGNOSIS — M6281 Muscle weakness (generalized): Secondary | ICD-10-CM | POA: Diagnosis not present

## 2014-10-21 DIAGNOSIS — Z9181 History of falling: Secondary | ICD-10-CM | POA: Diagnosis not present

## 2014-10-21 DIAGNOSIS — R1311 Dysphagia, oral phase: Secondary | ICD-10-CM | POA: Diagnosis not present

## 2014-10-21 DIAGNOSIS — R2689 Other abnormalities of gait and mobility: Secondary | ICD-10-CM | POA: Diagnosis not present

## 2014-10-22 DIAGNOSIS — R2689 Other abnormalities of gait and mobility: Secondary | ICD-10-CM | POA: Diagnosis not present

## 2014-10-22 DIAGNOSIS — M6281 Muscle weakness (generalized): Secondary | ICD-10-CM | POA: Diagnosis not present

## 2014-10-22 DIAGNOSIS — R1311 Dysphagia, oral phase: Secondary | ICD-10-CM | POA: Diagnosis not present

## 2014-10-22 DIAGNOSIS — Z9181 History of falling: Secondary | ICD-10-CM | POA: Diagnosis not present

## 2014-11-11 DIAGNOSIS — I10 Essential (primary) hypertension: Secondary | ICD-10-CM | POA: Diagnosis not present

## 2014-11-11 DIAGNOSIS — R2689 Other abnormalities of gait and mobility: Secondary | ICD-10-CM | POA: Diagnosis not present

## 2014-11-11 DIAGNOSIS — R1311 Dysphagia, oral phase: Secondary | ICD-10-CM | POA: Diagnosis not present

## 2014-11-11 DIAGNOSIS — M6281 Muscle weakness (generalized): Secondary | ICD-10-CM | POA: Diagnosis not present

## 2014-11-11 DIAGNOSIS — Z9181 History of falling: Secondary | ICD-10-CM | POA: Diagnosis not present

## 2014-11-12 DIAGNOSIS — Z9181 History of falling: Secondary | ICD-10-CM | POA: Diagnosis not present

## 2014-11-12 DIAGNOSIS — R1311 Dysphagia, oral phase: Secondary | ICD-10-CM | POA: Diagnosis not present

## 2014-11-12 DIAGNOSIS — M6281 Muscle weakness (generalized): Secondary | ICD-10-CM | POA: Diagnosis not present

## 2014-11-12 DIAGNOSIS — R2689 Other abnormalities of gait and mobility: Secondary | ICD-10-CM | POA: Diagnosis not present

## 2014-11-13 ENCOUNTER — Encounter: Payer: Self-pay | Admitting: Cardiovascular Disease

## 2014-11-13 ENCOUNTER — Other Ambulatory Visit: Payer: Self-pay | Admitting: Cardiovascular Disease

## 2014-11-13 ENCOUNTER — Ambulatory Visit (INDEPENDENT_AMBULATORY_CARE_PROVIDER_SITE_OTHER): Payer: Medicare Other | Admitting: Cardiovascular Disease

## 2014-11-13 VITALS — BP 102/68 | HR 68 | Ht 69.0 in | Wt 185.0 lb

## 2014-11-13 DIAGNOSIS — I639 Cerebral infarction, unspecified: Secondary | ICD-10-CM

## 2014-11-13 DIAGNOSIS — R55 Syncope and collapse: Secondary | ICD-10-CM

## 2014-11-13 DIAGNOSIS — I2510 Atherosclerotic heart disease of native coronary artery without angina pectoris: Secondary | ICD-10-CM | POA: Diagnosis not present

## 2014-11-13 DIAGNOSIS — I10 Essential (primary) hypertension: Secondary | ICD-10-CM | POA: Diagnosis not present

## 2014-11-13 DIAGNOSIS — E785 Hyperlipidemia, unspecified: Secondary | ICD-10-CM | POA: Diagnosis not present

## 2014-11-13 DIAGNOSIS — N390 Urinary tract infection, site not specified: Secondary | ICD-10-CM | POA: Diagnosis not present

## 2014-11-13 DIAGNOSIS — E860 Dehydration: Secondary | ICD-10-CM | POA: Diagnosis not present

## 2014-11-13 DIAGNOSIS — Z9181 History of falling: Secondary | ICD-10-CM | POA: Diagnosis not present

## 2014-11-13 DIAGNOSIS — M6281 Muscle weakness (generalized): Secondary | ICD-10-CM | POA: Diagnosis not present

## 2014-11-13 DIAGNOSIS — R2689 Other abnormalities of gait and mobility: Secondary | ICD-10-CM | POA: Diagnosis not present

## 2014-11-13 DIAGNOSIS — R1311 Dysphagia, oral phase: Secondary | ICD-10-CM | POA: Diagnosis not present

## 2014-11-13 NOTE — Progress Notes (Signed)
History of Present Illness: 74 yo AAM with history of CAD s/p 3 V CABG per Dr. Laneta SimmersBartle 2004,  HTN, Hyperlipidemia, CVA and BPH here today to establish with a cardiologist. I saw him once in 2012 but he had not been see by cardiology for 8 years before then and he has not been back to our office since 2012. Ad[facility] with [facility] in February 2016. He missed Neurology f/u.Marland Kitchen.   He is here today for follow up. He has been feeling well. He denies any chest pain, SOB, dizziness, near syncope or syncope. He lives in [facility]. He has stopped smoking and drinking alcohol.    Past Medical History  Diagnosis Date  . CAD (coronary artery disease)     LIMA to OM2, SVG to OM1, SVG to RCA  . HLD (hyperlipidemia)   . HTN (hypertension)   . BPH (benign prostatic hyperplasia)   . Stroke     Past Surgical History  Procedure Laterality Date  . Hernia repair    . Coronary artery bypass graft  2001    Current Outpatient Prescriptions  Medication Sig Dispense Refill  . acetaminophen (TYLENOL) 325 MG tablet Take 1 tablet (325 mg total) by mouth every 6 (six) hours as needed for mild pain (or Fever >/= 101).    Marland Kitchen. amLODipine (NORVASC) 5 MG tablet TAKE ONE TABLET BY MOUTH EVERY DAY 30 tablet 7  . aspirin EC 81 MG EC tablet Take 1 tablet (81 mg total) by mouth daily.    . carvedilol (COREG) 3.125 MG tablet Take 1 tablet (3.125 mg total) by mouth 2 (two) times daily with a meal. 60 tablet 0  . clopidogrel (PLAVIX) 75 MG tablet Take 1 tablet (75 mg total) by mouth daily.    Marland Kitchen. FLUoxetine (PROZAC) 20 MG tablet Take 1 tablet (20 mg total) by mouth daily. 90 tablet 3  . folic acid (FOLVITE) 1 MG tablet Take 1 tablet (1 mg total) by mouth daily.    Marland Kitchen. guaiFENesin (MUCINEX) 600 MG 12 hr tablet Take 1 tablet (600 mg total) by mouth 2 (two) times daily. 20 tablet 0  . megestrol (MEGACE) 40 MG tablet Take 1 tablet (40 mg total) by mouth daily. 30 tablet 0  . Multiple Vitamins-Minerals (MULTIVITAMIN) tablet  Take 1 tablet by mouth daily.    Marland Kitchen. senna-docusate (SENOKOT-S) 8.6-50 MG per tablet Take 2 tablets by mouth at bedtime. 30 tablet 0  . simvastatin (ZOCOR) 40 MG tablet Take 1 tablet (40 mg total) by mouth at bedtime. 90 tablet 3  . thiamine (VITAMIN B-1) 100 MG tablet Take 2 tablets (200 mg total) by mouth daily.     No current facility-administered medications for this visit.    No Known Allergies  History   Social History  . Marital Status: Single    Spouse Name: N/A  . Number of Children: 3  . Years of Education: N/A   Occupational History  . [place]ired, [employer]    Social History Main Topics  . Smoking status: Former Smoker -- 0.50 packs/day for 40 years    Types: Cigarettes  . Smokeless tobacco: Never Used  . Alcohol Use: 3.0 oz/week    5 Shots of liquor per week  . Drug Use: No  . Sexual Activity: Not Currently   Other Topics Concern  . Not on file   Social History Narrative    Family History  Problem Relation Age of Onset  . Arthritis    .  Hypertension    . Stroke    . Heart attack Mother   . Heart attack Father     Review of Systems:  As stated in the HPI and otherwise negative.   BP 102/68 mmHg  Pulse 68  Ht  (1.753 m)  Wt 185 lb (83.915 kg)  BMI 27.31 kg/m2  Physical Examination: General: Well developed, well nourished, NAD HEENT: OP clear, mucus membranes moist SKIN: warm, dry. No rashes. Neuro: No focal deficits Musculoskeletal: Muscle strength 5/5 all ext Psychiatric: Mood and affect normal Neck: No JVD, no carotid bruits, no thyromegaly, no lymphadenopathy. Lungs:Clear bilaterally, no wheezes, rhonci, crackles Cardiovascular: Regular rate and rhythm. No murmurs, gallops or rubs. Abdomen:Soft. Bowel sounds present. Non-tender.  Extremities: No lower extremity edema. Pulses are 2 + in the bilateral DP/PT  Echo 06/08/14: Left ventricle: The cavity size was normal. There was moderate concentric hypertrophy. Systolic function was  normal. The estimated ejection fraction was in the range of 60% to 65%. Wall motion was normal; there were no regional wall motion abnormalities. Doppler parameters are consistent with abnormal left ventricular relaxation (grade 1 diastolic dysfunction). The E/e&' ratio is between 8-15, suggesting indeterminate LV filling pressure. - Aortic valve: Mildly calcified leaflets. There was no stenosis. There was trace to mild regurgitation. - Mitral valve: Calcified annulus. There was trivial regurgitation. - Left atrium: The atrium was normal in size. - Right atrium: The atrium was mildly dilated. - Tricuspid valve: There was mild regurgitation. - Pulmonary arteries: PA peak pressure: 31 mm Hg (S). - Inferior vena cava: The vessel was normal in size. The respirophasic diameter changes were in the normal range (>= 50%), consistent with normal central venous pressure.  Impressions:  - LVEF 60-65%, normal wall motion, moderate LVH, mild RAE, trace to mild AI with calcified aortic valve but no stenosis, mild TR, RVSP 31 mmHg, diastolic dysfunction.  EKG:  EKG is not ordered today. The ekg ordered today demonstrates   Recent Labs: 06/07/2014: TSH 0.912 07/05/2014: ALT 11; Hemoglobin 15.7; Platelets PLATELET CLUMPS NOTED ON SMEAR, COUNT APPEARS ADEQUATE 07/08/2014: BUN 13; Creatinine, Ser 1.36*; Potassium 3.5; Sodium 136   Lipid Panel    Component Value Date/Time   CHOL 175 06/08/2014 0451   TRIG 85 06/08/2014 0451   HDL 25* 06/08/2014 0451   CHOLHDL 7.0 06/08/2014 0451   VLDL 17 06/08/2014 0451   LDLCALC 133* 06/08/2014 0451     Wt Readings from Last 3 Encounters:  11/13/14 185 lb (83.915 kg)  07/08/14 178 lb 9.2 oz (81 kg)  06/07/14 191 lb 3.2 oz (86.728 kg)     Other studies Reviewed: Additional studies/ records that were reviewed today include: . Review of the above records demonstrates:      Assessment and Plan:   1. CAD: s/p CABG in 2004. CAD  appears to be stable. He is on ASA, Plavix, statin, beta blocker.   2. Prior CVA: Will arrange 30 day event monitor to exclude atrial fibrillation.   3. HTN: BP controlled. No changes.   4. HLD: Continue statin.   Current medicines are reviewed at length with the patient today.  The patient does not have concerns regarding medicines.  The following changes have been made:  no change  Labs/ tests ordered today include:   No orders of the defined types were placed in this encounter.    Disposition:   FU with me in 6 months  Signed, Verne Carrow, MD 11/13/2014 3:26 PM    [facility] [address], [place], [place]  [address] Phone: [phone]; Fax: [phone]

## 2014-11-13 NOTE — Patient Instructions (Signed)
Medication Instructions:  Your physician recommends that you continue on your current medications as directed. Please refer to the Current Medication list given to you today.   Labwork: none  Testing/Procedures: Your physician has recommended that you wear an event monitor. Event monitors are medical devices that record the heart's electrical activity. Doctors most often us these monitors to diagnose arrhythmias. Arrhythmias are problems with the speed or rhythm of the heartbeat. The monitor is a small, portable device. You can wear one while you do your normal daily activities. This is usually used to diagnose what is causing palpitations/syncope (passing out).    Follow-Up: Your physician wants you to follow-up in: 6 months.  You will receive a reminder letter in the mail two months in advance. If you don't receive a letter, please call our office to schedule the follow-up appointment.

## 2014-11-14 DIAGNOSIS — R1311 Dysphagia, oral phase: Secondary | ICD-10-CM | POA: Diagnosis not present

## 2014-11-14 DIAGNOSIS — R2689 Other abnormalities of gait and mobility: Secondary | ICD-10-CM | POA: Diagnosis not present

## 2014-11-14 DIAGNOSIS — Z9181 History of falling: Secondary | ICD-10-CM | POA: Diagnosis not present

## 2014-11-14 DIAGNOSIS — E784 Other hyperlipidemia: Secondary | ICD-10-CM | POA: Diagnosis not present

## 2014-11-14 DIAGNOSIS — M6281 Muscle weakness (generalized): Secondary | ICD-10-CM | POA: Diagnosis not present

## 2014-11-14 DIAGNOSIS — I10 Essential (primary) hypertension: Secondary | ICD-10-CM | POA: Diagnosis not present

## 2014-11-15 ENCOUNTER — Ambulatory Visit (INDEPENDENT_AMBULATORY_CARE_PROVIDER_SITE_OTHER): Payer: Medicare Other

## 2014-11-15 DIAGNOSIS — I2510 Atherosclerotic heart disease of native coronary artery without angina pectoris: Secondary | ICD-10-CM | POA: Diagnosis not present

## 2014-11-15 DIAGNOSIS — I639 Cerebral infarction, unspecified: Secondary | ICD-10-CM

## 2014-11-15 DIAGNOSIS — R55 Syncope and collapse: Secondary | ICD-10-CM

## 2014-11-18 DIAGNOSIS — M6281 Muscle weakness (generalized): Secondary | ICD-10-CM | POA: Diagnosis not present

## 2014-11-18 DIAGNOSIS — Z9181 History of falling: Secondary | ICD-10-CM | POA: Diagnosis not present

## 2014-11-19 DIAGNOSIS — Z9181 History of falling: Secondary | ICD-10-CM | POA: Diagnosis not present

## 2014-11-19 DIAGNOSIS — M6281 Muscle weakness (generalized): Secondary | ICD-10-CM | POA: Diagnosis not present

## 2014-11-20 DIAGNOSIS — M6281 Muscle weakness (generalized): Secondary | ICD-10-CM | POA: Diagnosis not present

## 2014-11-20 DIAGNOSIS — Z9181 History of falling: Secondary | ICD-10-CM | POA: Diagnosis not present

## 2014-11-21 DIAGNOSIS — Z9181 History of falling: Secondary | ICD-10-CM | POA: Diagnosis not present

## 2014-11-21 DIAGNOSIS — M6281 Muscle weakness (generalized): Secondary | ICD-10-CM | POA: Diagnosis not present

## 2014-11-22 DIAGNOSIS — Z9181 History of falling: Secondary | ICD-10-CM | POA: Diagnosis not present

## 2014-11-22 DIAGNOSIS — M6281 Muscle weakness (generalized): Secondary | ICD-10-CM | POA: Diagnosis not present

## 2014-11-25 DIAGNOSIS — I10 Essential (primary) hypertension: Secondary | ICD-10-CM | POA: Diagnosis not present

## 2014-11-26 DIAGNOSIS — Z9181 History of falling: Secondary | ICD-10-CM | POA: Diagnosis not present

## 2014-11-26 DIAGNOSIS — M6281 Muscle weakness (generalized): Secondary | ICD-10-CM | POA: Diagnosis not present

## 2014-11-27 DIAGNOSIS — Z9181 History of falling: Secondary | ICD-10-CM | POA: Diagnosis not present

## 2014-11-27 DIAGNOSIS — M6281 Muscle weakness (generalized): Secondary | ICD-10-CM | POA: Diagnosis not present

## 2014-11-28 DIAGNOSIS — Z9181 History of falling: Secondary | ICD-10-CM | POA: Diagnosis not present

## 2014-11-28 DIAGNOSIS — M6281 Muscle weakness (generalized): Secondary | ICD-10-CM | POA: Diagnosis not present

## 2014-11-29 DIAGNOSIS — M6281 Muscle weakness (generalized): Secondary | ICD-10-CM | POA: Diagnosis not present

## 2014-11-29 DIAGNOSIS — Z9181 History of falling: Secondary | ICD-10-CM | POA: Diagnosis not present

## 2014-11-30 DIAGNOSIS — M6281 Muscle weakness (generalized): Secondary | ICD-10-CM | POA: Diagnosis not present

## 2014-11-30 DIAGNOSIS — Z9181 History of falling: Secondary | ICD-10-CM | POA: Diagnosis not present

## 2014-12-02 DIAGNOSIS — Z9181 History of falling: Secondary | ICD-10-CM | POA: Diagnosis not present

## 2014-12-02 DIAGNOSIS — M6281 Muscle weakness (generalized): Secondary | ICD-10-CM | POA: Diagnosis not present

## 2014-12-03 DIAGNOSIS — M6281 Muscle weakness (generalized): Secondary | ICD-10-CM | POA: Diagnosis not present

## 2014-12-03 DIAGNOSIS — Z9181 History of falling: Secondary | ICD-10-CM | POA: Diagnosis not present

## 2014-12-04 DIAGNOSIS — Z9181 History of falling: Secondary | ICD-10-CM | POA: Diagnosis not present

## 2014-12-04 DIAGNOSIS — M6281 Muscle weakness (generalized): Secondary | ICD-10-CM | POA: Diagnosis not present

## 2014-12-05 ENCOUNTER — Encounter: Payer: Self-pay | Admitting: *Deleted

## 2014-12-05 DIAGNOSIS — Z9181 History of falling: Secondary | ICD-10-CM | POA: Diagnosis not present

## 2014-12-05 DIAGNOSIS — M6281 Muscle weakness (generalized): Secondary | ICD-10-CM | POA: Diagnosis not present

## 2014-12-05 NOTE — Progress Notes (Signed)
Patient ID: Ian [name], male   DOB: [birth year], 74 y.o.   MRN: [id number] Notified by [employer] 11/28/2014, regarding 30 day cardiac event monitor.  Day 2 - no transmission notification.  [employer] had not received transmissions for [name] for more than 2 days.  Several attempts to contact patient were made, however at this time they had limitied data to report. End of Service report initiated 12/02/2014.

## 2014-12-06 DIAGNOSIS — Z9181 History of falling: Secondary | ICD-10-CM | POA: Diagnosis not present

## 2014-12-06 DIAGNOSIS — M6281 Muscle weakness (generalized): Secondary | ICD-10-CM | POA: Diagnosis not present

## 2014-12-09 DIAGNOSIS — M6281 Muscle weakness (generalized): Secondary | ICD-10-CM | POA: Diagnosis not present

## 2014-12-09 DIAGNOSIS — Z9181 History of falling: Secondary | ICD-10-CM | POA: Diagnosis not present

## 2014-12-10 DIAGNOSIS — M6281 Muscle weakness (generalized): Secondary | ICD-10-CM | POA: Diagnosis not present

## 2014-12-10 DIAGNOSIS — Z9181 History of falling: Secondary | ICD-10-CM | POA: Diagnosis not present

## 2014-12-11 DIAGNOSIS — Z9181 History of falling: Secondary | ICD-10-CM | POA: Diagnosis not present

## 2014-12-11 DIAGNOSIS — M6281 Muscle weakness (generalized): Secondary | ICD-10-CM | POA: Diagnosis not present

## 2014-12-12 DIAGNOSIS — M6281 Muscle weakness (generalized): Secondary | ICD-10-CM | POA: Diagnosis not present

## 2014-12-12 DIAGNOSIS — Z9181 History of falling: Secondary | ICD-10-CM | POA: Diagnosis not present

## 2014-12-14 DIAGNOSIS — Z9181 History of falling: Secondary | ICD-10-CM | POA: Diagnosis not present

## 2014-12-14 DIAGNOSIS — M6281 Muscle weakness (generalized): Secondary | ICD-10-CM | POA: Diagnosis not present

## 2014-12-16 DIAGNOSIS — Z9181 History of falling: Secondary | ICD-10-CM | POA: Diagnosis not present

## 2014-12-16 DIAGNOSIS — M6281 Muscle weakness (generalized): Secondary | ICD-10-CM | POA: Diagnosis not present

## 2014-12-17 DIAGNOSIS — Z9181 History of falling: Secondary | ICD-10-CM | POA: Diagnosis not present

## 2014-12-17 DIAGNOSIS — M6281 Muscle weakness (generalized): Secondary | ICD-10-CM | POA: Diagnosis not present

## 2014-12-18 DIAGNOSIS — M6281 Muscle weakness (generalized): Secondary | ICD-10-CM | POA: Diagnosis not present

## 2014-12-18 DIAGNOSIS — Z9181 History of falling: Secondary | ICD-10-CM | POA: Diagnosis not present

## 2014-12-19 DIAGNOSIS — Z9181 History of falling: Secondary | ICD-10-CM | POA: Diagnosis not present

## 2014-12-19 DIAGNOSIS — R4182 Altered mental status, unspecified: Secondary | ICD-10-CM | POA: Diagnosis not present

## 2014-12-19 DIAGNOSIS — M6281 Muscle weakness (generalized): Secondary | ICD-10-CM | POA: Diagnosis not present

## 2014-12-20 DIAGNOSIS — Z9181 History of falling: Secondary | ICD-10-CM | POA: Diagnosis not present

## 2014-12-20 DIAGNOSIS — M6281 Muscle weakness (generalized): Secondary | ICD-10-CM | POA: Diagnosis not present

## 2015-01-01 ENCOUNTER — Telehealth: Payer: Self-pay | Admitting: *Deleted

## 2015-01-01 NOTE — Telephone Encounter (Signed)
I spoke with Cala Bradford at [facility]. [phone]). She reports pt keeps taking event monitor off and is refusing to wear. I told her I would note this in his chart and she could send monitor back to company.

## 2015-01-01 NOTE — Telephone Encounter (Signed)
Thanks

## 2015-02-19 DIAGNOSIS — R2689 Other abnormalities of gait and mobility: Secondary | ICD-10-CM | POA: Diagnosis not present

## 2015-02-20 DIAGNOSIS — R2689 Other abnormalities of gait and mobility: Secondary | ICD-10-CM | POA: Diagnosis not present

## 2015-02-21 DIAGNOSIS — R2689 Other abnormalities of gait and mobility: Secondary | ICD-10-CM | POA: Diagnosis not present

## 2015-02-24 DIAGNOSIS — R2689 Other abnormalities of gait and mobility: Secondary | ICD-10-CM | POA: Diagnosis not present

## 2015-02-25 DIAGNOSIS — R2689 Other abnormalities of gait and mobility: Secondary | ICD-10-CM | POA: Diagnosis not present

## 2015-02-26 DIAGNOSIS — R2689 Other abnormalities of gait and mobility: Secondary | ICD-10-CM | POA: Diagnosis not present

## 2015-02-27 DIAGNOSIS — R2689 Other abnormalities of gait and mobility: Secondary | ICD-10-CM | POA: Diagnosis not present

## 2015-02-28 DIAGNOSIS — R2689 Other abnormalities of gait and mobility: Secondary | ICD-10-CM | POA: Diagnosis not present

## 2015-03-03 DIAGNOSIS — R2689 Other abnormalities of gait and mobility: Secondary | ICD-10-CM | POA: Diagnosis not present

## 2015-03-04 DIAGNOSIS — R2689 Other abnormalities of gait and mobility: Secondary | ICD-10-CM | POA: Diagnosis not present

## 2015-03-05 DIAGNOSIS — R2689 Other abnormalities of gait and mobility: Secondary | ICD-10-CM | POA: Diagnosis not present

## 2015-03-06 DIAGNOSIS — R2689 Other abnormalities of gait and mobility: Secondary | ICD-10-CM | POA: Diagnosis not present

## 2015-03-07 DIAGNOSIS — R2689 Other abnormalities of gait and mobility: Secondary | ICD-10-CM | POA: Diagnosis not present

## 2015-03-10 DIAGNOSIS — R2689 Other abnormalities of gait and mobility: Secondary | ICD-10-CM | POA: Diagnosis not present

## 2015-03-11 DIAGNOSIS — R2689 Other abnormalities of gait and mobility: Secondary | ICD-10-CM | POA: Diagnosis not present

## 2015-03-12 DIAGNOSIS — R2689 Other abnormalities of gait and mobility: Secondary | ICD-10-CM | POA: Diagnosis not present

## 2015-03-13 DIAGNOSIS — R2689 Other abnormalities of gait and mobility: Secondary | ICD-10-CM | POA: Diagnosis not present

## 2015-03-14 DIAGNOSIS — R2689 Other abnormalities of gait and mobility: Secondary | ICD-10-CM | POA: Diagnosis not present

## 2015-03-17 DIAGNOSIS — R2689 Other abnormalities of gait and mobility: Secondary | ICD-10-CM | POA: Diagnosis not present

## 2015-03-18 DIAGNOSIS — R2689 Other abnormalities of gait and mobility: Secondary | ICD-10-CM | POA: Diagnosis not present

## 2015-03-19 DIAGNOSIS — R2689 Other abnormalities of gait and mobility: Secondary | ICD-10-CM | POA: Diagnosis not present

## 2015-03-20 DIAGNOSIS — R2689 Other abnormalities of gait and mobility: Secondary | ICD-10-CM | POA: Diagnosis not present

## 2015-03-22 DIAGNOSIS — R2689 Other abnormalities of gait and mobility: Secondary | ICD-10-CM | POA: Diagnosis not present

## 2015-03-24 DIAGNOSIS — R2689 Other abnormalities of gait and mobility: Secondary | ICD-10-CM | POA: Diagnosis not present

## 2015-03-25 DIAGNOSIS — R2689 Other abnormalities of gait and mobility: Secondary | ICD-10-CM | POA: Diagnosis not present

## 2015-03-26 DIAGNOSIS — R2689 Other abnormalities of gait and mobility: Secondary | ICD-10-CM | POA: Diagnosis not present

## 2015-03-27 DIAGNOSIS — R2689 Other abnormalities of gait and mobility: Secondary | ICD-10-CM | POA: Diagnosis not present

## 2015-03-27 DIAGNOSIS — I2510 Atherosclerotic heart disease of native coronary artery without angina pectoris: Secondary | ICD-10-CM | POA: Diagnosis not present

## 2015-03-27 DIAGNOSIS — I10 Essential (primary) hypertension: Secondary | ICD-10-CM | POA: Diagnosis not present

## 2015-03-27 DIAGNOSIS — I639 Cerebral infarction, unspecified: Secondary | ICD-10-CM | POA: Diagnosis not present

## 2015-03-27 DIAGNOSIS — F0391 Unspecified dementia with behavioral disturbance: Secondary | ICD-10-CM | POA: Diagnosis not present

## 2015-03-27 DIAGNOSIS — N400 Enlarged prostate without lower urinary tract symptoms: Secondary | ICD-10-CM | POA: Diagnosis not present

## 2015-03-28 DIAGNOSIS — R2689 Other abnormalities of gait and mobility: Secondary | ICD-10-CM | POA: Diagnosis not present

## 2015-05-08 DIAGNOSIS — I10 Essential (primary) hypertension: Secondary | ICD-10-CM | POA: Diagnosis not present

## 2015-05-15 DIAGNOSIS — I2510 Atherosclerotic heart disease of native coronary artery without angina pectoris: Secondary | ICD-10-CM | POA: Diagnosis not present

## 2015-05-15 DIAGNOSIS — I639 Cerebral infarction, unspecified: Secondary | ICD-10-CM | POA: Diagnosis not present

## 2015-05-15 DIAGNOSIS — N400 Enlarged prostate without lower urinary tract symptoms: Secondary | ICD-10-CM | POA: Diagnosis not present

## 2015-05-15 DIAGNOSIS — F0391 Unspecified dementia with behavioral disturbance: Secondary | ICD-10-CM | POA: Diagnosis not present

## 2015-05-15 DIAGNOSIS — M6281 Muscle weakness (generalized): Secondary | ICD-10-CM | POA: Diagnosis not present

## 2015-05-15 DIAGNOSIS — I10 Essential (primary) hypertension: Secondary | ICD-10-CM | POA: Diagnosis not present

## 2015-05-16 ENCOUNTER — Ambulatory Visit (INDEPENDENT_AMBULATORY_CARE_PROVIDER_SITE_OTHER): Payer: Medicare Other | Admitting: Cardiology

## 2015-05-16 ENCOUNTER — Encounter: Payer: Self-pay | Admitting: Cardiology

## 2015-05-16 VITALS — BP 115/78 | HR 64 | Ht 69.0 in | Wt 201.4 lb

## 2015-05-16 DIAGNOSIS — R001 Bradycardia, unspecified: Secondary | ICD-10-CM

## 2015-05-16 DIAGNOSIS — E785 Hyperlipidemia, unspecified: Secondary | ICD-10-CM | POA: Diagnosis not present

## 2015-05-16 DIAGNOSIS — I639 Cerebral infarction, unspecified: Secondary | ICD-10-CM | POA: Diagnosis not present

## 2015-05-16 DIAGNOSIS — I498 Other specified cardiac arrhythmias: Secondary | ICD-10-CM | POA: Diagnosis not present

## 2015-05-16 DIAGNOSIS — M6281 Muscle weakness (generalized): Secondary | ICD-10-CM | POA: Diagnosis not present

## 2015-05-16 LAB — HEPATIC FUNCTION PANEL
ALK PHOS: 61 U/L (ref 40–115)
ALT: 18 U/L (ref 9–46)
AST: 22 U/L (ref 10–35)
Albumin: 4.1 g/dL (ref 3.6–5.1)
BILIRUBIN DIRECT: 0.1 mg/dL (ref <=0.2)
BILIRUBIN TOTAL: 0.5 mg/dL (ref 0.2–1.2)
Indirect Bilirubin: 0.4 mg/dL (ref 0.2–1.2)
Total Protein: 7.2 g/dL (ref 6.1–8.1)

## 2015-05-16 LAB — LIPID PANEL
CHOLESTEROL: 130 mg/dL (ref 125–200)
HDL: 44 mg/dL (ref >=40)
LDL Cholesterol: 72 mg/dL (ref <130)
Total CHOL/HDL Ratio: 3 Ratio (ref <=5.0)
Triglycerides: 72 mg/dL (ref <150)
VLDL: 14 mg/dL (ref <30)

## 2015-05-16 NOTE — Progress Notes (Signed)
05/16/2015 Artis Flock   1941/01/05  [id number]  Primary Physician No primary care provider on file. Primary Cardiologist: Dr. Clifton James   Reason for Visit/CC: 6 month f/u for CAD  HPI:  74 yo AAM with history of CAD s/p 3 V CABG per Dr. Laneta Simmers in 2004, HTN, Hyperlipidemia, CVA and BPH. Followed by Dr. Clifton James. He was last seen 6 months ago following his CVA. Dr. Clifton James ordered a 30 day event monitor to r/o atrial fibrillation, however there was only 1 day of recording as patient refused to wear monitor for the recommended duration. There was no documentation of atrial fibrillation at that time.   He presents to clinic today for 6 month f/u. He presents with a Agricultural engineer from his assisted living facility. He reports that he has done well since his last offive visit. No new issues/complaints. He denies any CP, dyspnea, palpitations, syncope/ near syncope or edema. He presents with his medication record and he has been receiving all of his prescribed cardiac meds. He denies any recurrent stroke-like symptoms.  He states that he would not wear the event monitor because it was uncomfortable. I discussed with the patient the option of EP referral for implantable loop recorder to assess for atrial fibrillation but he refuses.    Current Outpatient Prescriptions  Medication Sig Dispense Refill  . acetaminophen (TYLENOL) 325 MG tablet Take 1 tablet (325 mg total) by mouth every 6 (six) hours as needed for mild pain (or Fever >/= 101).    Marland Kitchen albuterol (PROVENTIL) (2.5 MG/3ML) 0.083% nebulizer solution Take 1 mg by nebulization every 6 (six) hours as needed. sob    . amLODipine (NORVASC) 5 MG tablet TAKE ONE TABLET BY MOUTH EVERY DAY 30 tablet 7  . aspirin EC 81 MG EC tablet Take 1 tablet (81 mg total) by mouth daily.    . carvedilol (COREG) 3.125 MG tablet Take 1 tablet (3.125 mg total) by mouth 2 (two) times daily with a meal. 60 tablet 0  . clopidogrel (PLAVIX) 75 MG tablet Take 1  tablet (75 mg total) by mouth daily.    Marland Kitchen FLUoxetine (PROZAC) 20 MG tablet Take 1 tablet (20 mg total) by mouth daily. 90 tablet 3  . folic acid (FOLVITE) 1 MG tablet Take 1 tablet (1 mg total) by mouth daily.    Marland Kitchen guaiFENesin (MUCINEX) 600 MG 12 hr tablet Take 1 tablet (600 mg total) by mouth 2 (two) times daily. 20 tablet 0  . megestrol (MEGACE) 40 MG/ML suspension Take 1 mg by mouth daily.    . Multiple Vitamins-Minerals (MULTIVITAMIN) tablet Take 1 tablet by mouth daily.    Marland Kitchen senna-docusate (SENOKOT-S) 8.6-50 MG per tablet Take 2 tablets by mouth at bedtime. 30 tablet 0  . simvastatin (ZOCOR) 40 MG tablet Take 1 tablet (40 mg total) by mouth at bedtime. 90 tablet 3  . thiamine (VITAMIN B-1) 100 MG tablet Take 2 tablets (200 mg total) by mouth daily.     No current facility-administered medications for this visit.    No Known Allergies  Social History   Social History  . Marital Status: Single    Spouse Name: N/A  . Number of Children: 3  . Years of Education: N/A   Occupational History  . Retired, [employer]    Social History Main Topics  . Smoking status: Former Smoker -- 0.50 packs/day for 40 years    Types: Cigarettes  . Smokeless tobacco: Never Used  . Alcohol Use: 3.0 oz/week  5 Shots of liquor per week  . Drug Use: No  . Sexual Activity: Not Currently   Other Topics Concern  . Not on file   Social History Narrative     Review of Systems: General: negative for chills, fever, night sweats or weight changes.  Cardiovascular: negative for chest pain, dyspnea on exertion, edema, orthopnea, palpitations, paroxysmal nocturnal dyspnea or shortness of breath Dermatological: negative for rash Respiratory: negative for cough or wheezing Urologic: negative for hematuria Abdominal: negative for nausea, vomiting, diarrhea, bright red blood per rectum, melena, or hematemesis Neurologic: negative for visual changes, syncope, or dizziness All other systems reviewed  and are otherwise negative except as noted above.    Blood pressure 115/78, pulse 64, height 5\' 9"  (1.753 m), weight 201 lb 6.4 oz (91.354 kg).  General appearance: alert, cooperative and no distress; in a wheelchair Neck: no carotid bruit and no JVD Lungs: clear to auscultation bilaterally Heart: regular rate and rhythm, S1, S2 normal, no murmur, click, rub or gallop Extremities: no LEE Pulses: 2+ and symmetric Skin: warm and dry Neurologic: Grossly normal  EKG NSR. No ischemia  ASSESSMENT AND PLAN:   1. CAD: s/p 3 V CABG per Dr. Laneta SimmersBartle in 2004. EKG negative for ischemia. He denies any anginal symptoms. No dyspnea. Continue ASA, Plavix, BB and statin.   2. H/o CVA: on ASA and Plavix. Patient denies palpitations. EKG shows NSR. Patient refused to wear 30 day event monitor because it was uncomfortable. I discussed with patient the option of EP referral for implantable loop recorder to assess for atrial fibrillation but he refuses.    3. HTN: BP is well controlled today at 115/78.   4. HLD: last lipid panel 06/08/14 showed poorly controlled LDL at 133. He is currently on 40 mg of simvastatin. Will recheck FLP today + HFTs. If LDL still not at goal, will need to change to Lipitor for better LDL reduction, given his CAD. Goal is <70 mg/dL.     PLAN  F/u with Dr. Clifton JamesMcAlhany in 6 months.   Robbie LisSIMMONS, Sheronda Parran PA-C 05/16/2015 8:29 AM

## 2015-05-16 NOTE — Patient Instructions (Signed)
Medication Instructions:  Your physician recommends that you continue on your current medications as directed. Please refer to the Current Medication list given to you today.   Labwork: Your physician recommends that you have FASTING lipid profile: lipid/lft   Testing/Procedures: -None  Follow-Up: Your physician wants you to follow-up in: 6 month with Dr. Clifton JamesMcAlhany.  You will receive a reminder letter in the mail two months in advance. If you don't receive a letter, please call our office to schedule the follow-up appointment.   Any Other Special Instructions Will Be Listed Below (If Applicable).     If you need a refill on your cardiac medications before your next appointment, please call your pharmacy.

## 2015-05-17 DIAGNOSIS — M6281 Muscle weakness (generalized): Secondary | ICD-10-CM | POA: Diagnosis not present

## 2015-05-17 DIAGNOSIS — I639 Cerebral infarction, unspecified: Secondary | ICD-10-CM | POA: Diagnosis not present

## 2015-05-19 DIAGNOSIS — I639 Cerebral infarction, unspecified: Secondary | ICD-10-CM | POA: Diagnosis not present

## 2015-05-19 DIAGNOSIS — M6281 Muscle weakness (generalized): Secondary | ICD-10-CM | POA: Diagnosis not present

## 2015-05-21 DIAGNOSIS — I639 Cerebral infarction, unspecified: Secondary | ICD-10-CM | POA: Diagnosis not present

## 2015-05-21 DIAGNOSIS — M6281 Muscle weakness (generalized): Secondary | ICD-10-CM | POA: Diagnosis not present

## 2015-05-22 DIAGNOSIS — I639 Cerebral infarction, unspecified: Secondary | ICD-10-CM | POA: Diagnosis not present

## 2015-05-22 DIAGNOSIS — M6281 Muscle weakness (generalized): Secondary | ICD-10-CM | POA: Diagnosis not present

## 2015-05-23 DIAGNOSIS — I639 Cerebral infarction, unspecified: Secondary | ICD-10-CM | POA: Diagnosis not present

## 2015-05-23 DIAGNOSIS — M6281 Muscle weakness (generalized): Secondary | ICD-10-CM | POA: Diagnosis not present

## 2015-05-24 DIAGNOSIS — M6281 Muscle weakness (generalized): Secondary | ICD-10-CM | POA: Diagnosis not present

## 2015-05-24 DIAGNOSIS — I639 Cerebral infarction, unspecified: Secondary | ICD-10-CM | POA: Diagnosis not present

## 2015-05-26 DIAGNOSIS — I639 Cerebral infarction, unspecified: Secondary | ICD-10-CM | POA: Diagnosis not present

## 2015-05-26 DIAGNOSIS — M6281 Muscle weakness (generalized): Secondary | ICD-10-CM | POA: Diagnosis not present

## 2015-05-27 DIAGNOSIS — I639 Cerebral infarction, unspecified: Secondary | ICD-10-CM | POA: Diagnosis not present

## 2015-05-27 DIAGNOSIS — M6281 Muscle weakness (generalized): Secondary | ICD-10-CM | POA: Diagnosis not present

## 2015-05-28 DIAGNOSIS — M6281 Muscle weakness (generalized): Secondary | ICD-10-CM | POA: Diagnosis not present

## 2015-05-28 DIAGNOSIS — I639 Cerebral infarction, unspecified: Secondary | ICD-10-CM | POA: Diagnosis not present

## 2015-06-12 DIAGNOSIS — I10 Essential (primary) hypertension: Secondary | ICD-10-CM | POA: Diagnosis not present

## 2015-06-12 DIAGNOSIS — F0391 Unspecified dementia with behavioral disturbance: Secondary | ICD-10-CM | POA: Diagnosis not present

## 2015-06-12 DIAGNOSIS — N400 Enlarged prostate without lower urinary tract symptoms: Secondary | ICD-10-CM | POA: Diagnosis not present

## 2015-06-12 DIAGNOSIS — I2510 Atherosclerotic heart disease of native coronary artery without angina pectoris: Secondary | ICD-10-CM | POA: Diagnosis not present

## 2015-06-12 DIAGNOSIS — I639 Cerebral infarction, unspecified: Secondary | ICD-10-CM | POA: Diagnosis not present

## 2015-07-10 DIAGNOSIS — N400 Enlarged prostate without lower urinary tract symptoms: Secondary | ICD-10-CM | POA: Diagnosis not present

## 2015-07-10 DIAGNOSIS — I10 Essential (primary) hypertension: Secondary | ICD-10-CM | POA: Diagnosis not present

## 2015-07-10 DIAGNOSIS — F419 Anxiety disorder, unspecified: Secondary | ICD-10-CM | POA: Diagnosis not present

## 2015-07-10 DIAGNOSIS — I639 Cerebral infarction, unspecified: Secondary | ICD-10-CM | POA: Diagnosis not present

## 2015-07-10 DIAGNOSIS — F0390 Unspecified dementia without behavioral disturbance: Secondary | ICD-10-CM | POA: Diagnosis not present

## 2015-07-10 DIAGNOSIS — F329 Major depressive disorder, single episode, unspecified: Secondary | ICD-10-CM | POA: Diagnosis not present

## 2015-07-10 DIAGNOSIS — F0391 Unspecified dementia with behavioral disturbance: Secondary | ICD-10-CM | POA: Diagnosis not present

## 2015-07-25 DIAGNOSIS — I10 Essential (primary) hypertension: Secondary | ICD-10-CM | POA: Diagnosis not present

## 2015-07-25 DIAGNOSIS — R509 Fever, unspecified: Secondary | ICD-10-CM | POA: Diagnosis not present

## 2015-07-28 DIAGNOSIS — I10 Essential (primary) hypertension: Secondary | ICD-10-CM | POA: Diagnosis not present

## 2015-08-05 DIAGNOSIS — R1311 Dysphagia, oral phase: Secondary | ICD-10-CM | POA: Diagnosis not present

## 2015-08-05 DIAGNOSIS — I679 Cerebrovascular disease, unspecified: Secondary | ICD-10-CM | POA: Diagnosis not present

## 2015-08-05 DIAGNOSIS — M6281 Muscle weakness (generalized): Secondary | ICD-10-CM | POA: Diagnosis not present

## 2015-08-05 DIAGNOSIS — G9349 Other encephalopathy: Secondary | ICD-10-CM | POA: Diagnosis not present

## 2015-08-05 DIAGNOSIS — R41841 Cognitive communication deficit: Secondary | ICD-10-CM | POA: Diagnosis not present

## 2015-08-05 DIAGNOSIS — I69328 Other speech and language deficits following cerebral infarction: Secondary | ICD-10-CM | POA: Diagnosis not present

## 2015-08-05 DIAGNOSIS — I639 Cerebral infarction, unspecified: Secondary | ICD-10-CM | POA: Diagnosis not present

## 2015-08-05 DIAGNOSIS — R488 Other symbolic dysfunctions: Secondary | ICD-10-CM | POA: Diagnosis not present

## 2015-08-05 DIAGNOSIS — R278 Other lack of coordination: Secondary | ICD-10-CM | POA: Diagnosis not present

## 2015-08-05 DIAGNOSIS — R55 Syncope and collapse: Secondary | ICD-10-CM | POA: Diagnosis not present

## 2015-08-05 DIAGNOSIS — R262 Difficulty in walking, not elsewhere classified: Secondary | ICD-10-CM | POA: Diagnosis not present

## 2015-08-05 DIAGNOSIS — G459 Transient cerebral ischemic attack, unspecified: Secondary | ICD-10-CM | POA: Diagnosis not present

## 2015-08-07 DIAGNOSIS — F0391 Unspecified dementia with behavioral disturbance: Secondary | ICD-10-CM | POA: Diagnosis not present

## 2015-08-07 DIAGNOSIS — J449 Chronic obstructive pulmonary disease, unspecified: Secondary | ICD-10-CM | POA: Diagnosis not present

## 2015-08-07 DIAGNOSIS — R55 Syncope and collapse: Secondary | ICD-10-CM | POA: Diagnosis not present

## 2015-08-07 DIAGNOSIS — N400 Enlarged prostate without lower urinary tract symptoms: Secondary | ICD-10-CM | POA: Diagnosis not present

## 2015-08-07 DIAGNOSIS — I679 Cerebrovascular disease, unspecified: Secondary | ICD-10-CM | POA: Diagnosis not present

## 2015-08-07 DIAGNOSIS — I639 Cerebral infarction, unspecified: Secondary | ICD-10-CM | POA: Diagnosis not present

## 2015-08-07 DIAGNOSIS — R41841 Cognitive communication deficit: Secondary | ICD-10-CM | POA: Diagnosis not present

## 2015-08-07 DIAGNOSIS — G459 Transient cerebral ischemic attack, unspecified: Secondary | ICD-10-CM | POA: Diagnosis not present

## 2015-08-07 DIAGNOSIS — R1311 Dysphagia, oral phase: Secondary | ICD-10-CM | POA: Diagnosis not present

## 2015-08-07 DIAGNOSIS — I10 Essential (primary) hypertension: Secondary | ICD-10-CM | POA: Diagnosis not present

## 2015-08-07 DIAGNOSIS — I2510 Atherosclerotic heart disease of native coronary artery without angina pectoris: Secondary | ICD-10-CM | POA: Diagnosis not present

## 2015-08-08 DIAGNOSIS — R41841 Cognitive communication deficit: Secondary | ICD-10-CM | POA: Diagnosis not present

## 2015-08-08 DIAGNOSIS — R55 Syncope and collapse: Secondary | ICD-10-CM | POA: Diagnosis not present

## 2015-08-08 DIAGNOSIS — G459 Transient cerebral ischemic attack, unspecified: Secondary | ICD-10-CM | POA: Diagnosis not present

## 2015-08-08 DIAGNOSIS — R1311 Dysphagia, oral phase: Secondary | ICD-10-CM | POA: Diagnosis not present

## 2015-08-08 DIAGNOSIS — I639 Cerebral infarction, unspecified: Secondary | ICD-10-CM | POA: Diagnosis not present

## 2015-08-08 DIAGNOSIS — I679 Cerebrovascular disease, unspecified: Secondary | ICD-10-CM | POA: Diagnosis not present

## 2015-08-11 DIAGNOSIS — R1311 Dysphagia, oral phase: Secondary | ICD-10-CM | POA: Diagnosis not present

## 2015-08-11 DIAGNOSIS — R41841 Cognitive communication deficit: Secondary | ICD-10-CM | POA: Diagnosis not present

## 2015-08-11 DIAGNOSIS — G459 Transient cerebral ischemic attack, unspecified: Secondary | ICD-10-CM | POA: Diagnosis not present

## 2015-08-11 DIAGNOSIS — I679 Cerebrovascular disease, unspecified: Secondary | ICD-10-CM | POA: Diagnosis not present

## 2015-08-11 DIAGNOSIS — I639 Cerebral infarction, unspecified: Secondary | ICD-10-CM | POA: Diagnosis not present

## 2015-08-11 DIAGNOSIS — R55 Syncope and collapse: Secondary | ICD-10-CM | POA: Diagnosis not present

## 2015-08-12 DIAGNOSIS — G459 Transient cerebral ischemic attack, unspecified: Secondary | ICD-10-CM | POA: Diagnosis not present

## 2015-08-12 DIAGNOSIS — R55 Syncope and collapse: Secondary | ICD-10-CM | POA: Diagnosis not present

## 2015-08-12 DIAGNOSIS — I639 Cerebral infarction, unspecified: Secondary | ICD-10-CM | POA: Diagnosis not present

## 2015-08-12 DIAGNOSIS — R1311 Dysphagia, oral phase: Secondary | ICD-10-CM | POA: Diagnosis not present

## 2015-08-12 DIAGNOSIS — R41841 Cognitive communication deficit: Secondary | ICD-10-CM | POA: Diagnosis not present

## 2015-08-12 DIAGNOSIS — I679 Cerebrovascular disease, unspecified: Secondary | ICD-10-CM | POA: Diagnosis not present

## 2015-08-13 DIAGNOSIS — R55 Syncope and collapse: Secondary | ICD-10-CM | POA: Diagnosis not present

## 2015-08-13 DIAGNOSIS — G459 Transient cerebral ischemic attack, unspecified: Secondary | ICD-10-CM | POA: Diagnosis not present

## 2015-08-13 DIAGNOSIS — R1311 Dysphagia, oral phase: Secondary | ICD-10-CM | POA: Diagnosis not present

## 2015-08-13 DIAGNOSIS — I679 Cerebrovascular disease, unspecified: Secondary | ICD-10-CM | POA: Diagnosis not present

## 2015-08-13 DIAGNOSIS — R41841 Cognitive communication deficit: Secondary | ICD-10-CM | POA: Diagnosis not present

## 2015-08-13 DIAGNOSIS — I639 Cerebral infarction, unspecified: Secondary | ICD-10-CM | POA: Diagnosis not present

## 2015-08-14 DIAGNOSIS — R55 Syncope and collapse: Secondary | ICD-10-CM | POA: Diagnosis not present

## 2015-08-14 DIAGNOSIS — R1311 Dysphagia, oral phase: Secondary | ICD-10-CM | POA: Diagnosis not present

## 2015-08-14 DIAGNOSIS — R41841 Cognitive communication deficit: Secondary | ICD-10-CM | POA: Diagnosis not present

## 2015-08-14 DIAGNOSIS — G459 Transient cerebral ischemic attack, unspecified: Secondary | ICD-10-CM | POA: Diagnosis not present

## 2015-08-14 DIAGNOSIS — I679 Cerebrovascular disease, unspecified: Secondary | ICD-10-CM | POA: Diagnosis not present

## 2015-08-14 DIAGNOSIS — I639 Cerebral infarction, unspecified: Secondary | ICD-10-CM | POA: Diagnosis not present

## 2015-08-15 DIAGNOSIS — I679 Cerebrovascular disease, unspecified: Secondary | ICD-10-CM | POA: Diagnosis not present

## 2015-08-15 DIAGNOSIS — R1311 Dysphagia, oral phase: Secondary | ICD-10-CM | POA: Diagnosis not present

## 2015-08-15 DIAGNOSIS — I639 Cerebral infarction, unspecified: Secondary | ICD-10-CM | POA: Diagnosis not present

## 2015-08-15 DIAGNOSIS — R55 Syncope and collapse: Secondary | ICD-10-CM | POA: Diagnosis not present

## 2015-08-15 DIAGNOSIS — R41841 Cognitive communication deficit: Secondary | ICD-10-CM | POA: Diagnosis not present

## 2015-08-15 DIAGNOSIS — G459 Transient cerebral ischemic attack, unspecified: Secondary | ICD-10-CM | POA: Diagnosis not present

## 2015-08-18 DIAGNOSIS — I639 Cerebral infarction, unspecified: Secondary | ICD-10-CM | POA: Diagnosis not present

## 2015-08-18 DIAGNOSIS — I679 Cerebrovascular disease, unspecified: Secondary | ICD-10-CM | POA: Diagnosis not present

## 2015-08-18 DIAGNOSIS — R488 Other symbolic dysfunctions: Secondary | ICD-10-CM | POA: Diagnosis not present

## 2015-08-19 DIAGNOSIS — I679 Cerebrovascular disease, unspecified: Secondary | ICD-10-CM | POA: Diagnosis not present

## 2015-08-19 DIAGNOSIS — I639 Cerebral infarction, unspecified: Secondary | ICD-10-CM | POA: Diagnosis not present

## 2015-08-19 DIAGNOSIS — R488 Other symbolic dysfunctions: Secondary | ICD-10-CM | POA: Diagnosis not present

## 2015-08-20 DIAGNOSIS — I679 Cerebrovascular disease, unspecified: Secondary | ICD-10-CM | POA: Diagnosis not present

## 2015-08-20 DIAGNOSIS — I639 Cerebral infarction, unspecified: Secondary | ICD-10-CM | POA: Diagnosis not present

## 2015-08-20 DIAGNOSIS — R488 Other symbolic dysfunctions: Secondary | ICD-10-CM | POA: Diagnosis not present

## 2015-08-21 DIAGNOSIS — I679 Cerebrovascular disease, unspecified: Secondary | ICD-10-CM | POA: Diagnosis not present

## 2015-08-21 DIAGNOSIS — I639 Cerebral infarction, unspecified: Secondary | ICD-10-CM | POA: Diagnosis not present

## 2015-08-21 DIAGNOSIS — R488 Other symbolic dysfunctions: Secondary | ICD-10-CM | POA: Diagnosis not present

## 2015-08-22 DIAGNOSIS — I679 Cerebrovascular disease, unspecified: Secondary | ICD-10-CM | POA: Diagnosis not present

## 2015-08-22 DIAGNOSIS — R488 Other symbolic dysfunctions: Secondary | ICD-10-CM | POA: Diagnosis not present

## 2015-08-22 DIAGNOSIS — I639 Cerebral infarction, unspecified: Secondary | ICD-10-CM | POA: Diagnosis not present

## 2015-08-25 DIAGNOSIS — R488 Other symbolic dysfunctions: Secondary | ICD-10-CM | POA: Diagnosis not present

## 2015-08-25 DIAGNOSIS — I639 Cerebral infarction, unspecified: Secondary | ICD-10-CM | POA: Diagnosis not present

## 2015-08-25 DIAGNOSIS — I679 Cerebrovascular disease, unspecified: Secondary | ICD-10-CM | POA: Diagnosis not present

## 2015-08-26 DIAGNOSIS — R488 Other symbolic dysfunctions: Secondary | ICD-10-CM | POA: Diagnosis not present

## 2015-08-26 DIAGNOSIS — I679 Cerebrovascular disease, unspecified: Secondary | ICD-10-CM | POA: Diagnosis not present

## 2015-08-26 DIAGNOSIS — I639 Cerebral infarction, unspecified: Secondary | ICD-10-CM | POA: Diagnosis not present

## 2015-08-27 DIAGNOSIS — I639 Cerebral infarction, unspecified: Secondary | ICD-10-CM | POA: Diagnosis not present

## 2015-08-27 DIAGNOSIS — I679 Cerebrovascular disease, unspecified: Secondary | ICD-10-CM | POA: Diagnosis not present

## 2015-08-27 DIAGNOSIS — R488 Other symbolic dysfunctions: Secondary | ICD-10-CM | POA: Diagnosis not present

## 2015-08-28 DIAGNOSIS — I679 Cerebrovascular disease, unspecified: Secondary | ICD-10-CM | POA: Diagnosis not present

## 2015-08-28 DIAGNOSIS — I639 Cerebral infarction, unspecified: Secondary | ICD-10-CM | POA: Diagnosis not present

## 2015-08-28 DIAGNOSIS — R488 Other symbolic dysfunctions: Secondary | ICD-10-CM | POA: Diagnosis not present

## 2015-08-29 DIAGNOSIS — I679 Cerebrovascular disease, unspecified: Secondary | ICD-10-CM | POA: Diagnosis not present

## 2015-08-29 DIAGNOSIS — R488 Other symbolic dysfunctions: Secondary | ICD-10-CM | POA: Diagnosis not present

## 2015-08-29 DIAGNOSIS — I639 Cerebral infarction, unspecified: Secondary | ICD-10-CM | POA: Diagnosis not present

## 2015-09-01 DIAGNOSIS — R488 Other symbolic dysfunctions: Secondary | ICD-10-CM | POA: Diagnosis not present

## 2015-09-01 DIAGNOSIS — I639 Cerebral infarction, unspecified: Secondary | ICD-10-CM | POA: Diagnosis not present

## 2015-09-01 DIAGNOSIS — I679 Cerebrovascular disease, unspecified: Secondary | ICD-10-CM | POA: Diagnosis not present

## 2015-09-02 DIAGNOSIS — R488 Other symbolic dysfunctions: Secondary | ICD-10-CM | POA: Diagnosis not present

## 2015-09-02 DIAGNOSIS — I679 Cerebrovascular disease, unspecified: Secondary | ICD-10-CM | POA: Diagnosis not present

## 2015-09-02 DIAGNOSIS — I639 Cerebral infarction, unspecified: Secondary | ICD-10-CM | POA: Diagnosis not present

## 2015-09-04 DIAGNOSIS — I679 Cerebrovascular disease, unspecified: Secondary | ICD-10-CM | POA: Diagnosis not present

## 2015-09-04 DIAGNOSIS — I639 Cerebral infarction, unspecified: Secondary | ICD-10-CM | POA: Diagnosis not present

## 2015-09-04 DIAGNOSIS — R488 Other symbolic dysfunctions: Secondary | ICD-10-CM | POA: Diagnosis not present

## 2015-09-05 DIAGNOSIS — I639 Cerebral infarction, unspecified: Secondary | ICD-10-CM | POA: Diagnosis not present

## 2015-09-05 DIAGNOSIS — I679 Cerebrovascular disease, unspecified: Secondary | ICD-10-CM | POA: Diagnosis not present

## 2015-09-05 DIAGNOSIS — R488 Other symbolic dysfunctions: Secondary | ICD-10-CM | POA: Diagnosis not present

## 2015-09-08 DIAGNOSIS — I639 Cerebral infarction, unspecified: Secondary | ICD-10-CM | POA: Diagnosis not present

## 2015-09-08 DIAGNOSIS — R488 Other symbolic dysfunctions: Secondary | ICD-10-CM | POA: Diagnosis not present

## 2015-09-08 DIAGNOSIS — I679 Cerebrovascular disease, unspecified: Secondary | ICD-10-CM | POA: Diagnosis not present

## 2015-09-09 DIAGNOSIS — I679 Cerebrovascular disease, unspecified: Secondary | ICD-10-CM | POA: Diagnosis not present

## 2015-09-09 DIAGNOSIS — I639 Cerebral infarction, unspecified: Secondary | ICD-10-CM | POA: Diagnosis not present

## 2015-09-09 DIAGNOSIS — R488 Other symbolic dysfunctions: Secondary | ICD-10-CM | POA: Diagnosis not present

## 2015-09-10 DIAGNOSIS — R488 Other symbolic dysfunctions: Secondary | ICD-10-CM | POA: Diagnosis not present

## 2015-09-10 DIAGNOSIS — I679 Cerebrovascular disease, unspecified: Secondary | ICD-10-CM | POA: Diagnosis not present

## 2015-09-10 DIAGNOSIS — I639 Cerebral infarction, unspecified: Secondary | ICD-10-CM | POA: Diagnosis not present

## 2015-09-11 DIAGNOSIS — I10 Essential (primary) hypertension: Secondary | ICD-10-CM | POA: Diagnosis not present

## 2015-09-11 DIAGNOSIS — I679 Cerebrovascular disease, unspecified: Secondary | ICD-10-CM | POA: Diagnosis not present

## 2015-09-11 DIAGNOSIS — R488 Other symbolic dysfunctions: Secondary | ICD-10-CM | POA: Diagnosis not present

## 2015-09-11 DIAGNOSIS — I639 Cerebral infarction, unspecified: Secondary | ICD-10-CM | POA: Diagnosis not present

## 2015-09-11 DIAGNOSIS — R269 Unspecified abnormalities of gait and mobility: Secondary | ICD-10-CM | POA: Diagnosis not present

## 2015-09-11 DIAGNOSIS — F0391 Unspecified dementia with behavioral disturbance: Secondary | ICD-10-CM | POA: Diagnosis not present

## 2015-09-12 DIAGNOSIS — R488 Other symbolic dysfunctions: Secondary | ICD-10-CM | POA: Diagnosis not present

## 2015-09-12 DIAGNOSIS — I679 Cerebrovascular disease, unspecified: Secondary | ICD-10-CM | POA: Diagnosis not present

## 2015-09-12 DIAGNOSIS — I639 Cerebral infarction, unspecified: Secondary | ICD-10-CM | POA: Diagnosis not present

## 2015-09-15 DIAGNOSIS — I679 Cerebrovascular disease, unspecified: Secondary | ICD-10-CM | POA: Diagnosis not present

## 2015-09-15 DIAGNOSIS — I639 Cerebral infarction, unspecified: Secondary | ICD-10-CM | POA: Diagnosis not present

## 2015-09-15 DIAGNOSIS — R488 Other symbolic dysfunctions: Secondary | ICD-10-CM | POA: Diagnosis not present

## 2015-09-16 DIAGNOSIS — I639 Cerebral infarction, unspecified: Secondary | ICD-10-CM | POA: Diagnosis not present

## 2015-09-16 DIAGNOSIS — R488 Other symbolic dysfunctions: Secondary | ICD-10-CM | POA: Diagnosis not present

## 2015-09-16 DIAGNOSIS — I679 Cerebrovascular disease, unspecified: Secondary | ICD-10-CM | POA: Diagnosis not present

## 2015-09-17 DIAGNOSIS — I639 Cerebral infarction, unspecified: Secondary | ICD-10-CM | POA: Diagnosis not present

## 2015-09-17 DIAGNOSIS — I679 Cerebrovascular disease, unspecified: Secondary | ICD-10-CM | POA: Diagnosis not present

## 2015-09-17 DIAGNOSIS — R488 Other symbolic dysfunctions: Secondary | ICD-10-CM | POA: Diagnosis not present

## 2015-09-18 DIAGNOSIS — I679 Cerebrovascular disease, unspecified: Secondary | ICD-10-CM | POA: Diagnosis not present

## 2015-09-18 DIAGNOSIS — R488 Other symbolic dysfunctions: Secondary | ICD-10-CM | POA: Diagnosis not present

## 2015-09-18 DIAGNOSIS — F329 Major depressive disorder, single episode, unspecified: Secondary | ICD-10-CM | POA: Diagnosis not present

## 2015-09-18 DIAGNOSIS — I639 Cerebral infarction, unspecified: Secondary | ICD-10-CM | POA: Diagnosis not present

## 2015-09-18 DIAGNOSIS — F419 Anxiety disorder, unspecified: Secondary | ICD-10-CM | POA: Diagnosis not present

## 2015-09-18 DIAGNOSIS — F0390 Unspecified dementia without behavioral disturbance: Secondary | ICD-10-CM | POA: Diagnosis not present

## 2015-09-19 DIAGNOSIS — I639 Cerebral infarction, unspecified: Secondary | ICD-10-CM | POA: Diagnosis not present

## 2015-09-19 DIAGNOSIS — I679 Cerebrovascular disease, unspecified: Secondary | ICD-10-CM | POA: Diagnosis not present

## 2015-09-19 DIAGNOSIS — R488 Other symbolic dysfunctions: Secondary | ICD-10-CM | POA: Diagnosis not present

## 2015-09-22 DIAGNOSIS — I639 Cerebral infarction, unspecified: Secondary | ICD-10-CM | POA: Diagnosis not present

## 2015-09-22 DIAGNOSIS — R488 Other symbolic dysfunctions: Secondary | ICD-10-CM | POA: Diagnosis not present

## 2015-09-22 DIAGNOSIS — I679 Cerebrovascular disease, unspecified: Secondary | ICD-10-CM | POA: Diagnosis not present

## 2015-09-23 DIAGNOSIS — R488 Other symbolic dysfunctions: Secondary | ICD-10-CM | POA: Diagnosis not present

## 2015-09-23 DIAGNOSIS — I679 Cerebrovascular disease, unspecified: Secondary | ICD-10-CM | POA: Diagnosis not present

## 2015-09-23 DIAGNOSIS — I639 Cerebral infarction, unspecified: Secondary | ICD-10-CM | POA: Diagnosis not present

## 2015-09-24 DIAGNOSIS — R488 Other symbolic dysfunctions: Secondary | ICD-10-CM | POA: Diagnosis not present

## 2015-09-24 DIAGNOSIS — I679 Cerebrovascular disease, unspecified: Secondary | ICD-10-CM | POA: Diagnosis not present

## 2015-09-24 DIAGNOSIS — I639 Cerebral infarction, unspecified: Secondary | ICD-10-CM | POA: Diagnosis not present

## 2015-09-25 DIAGNOSIS — I639 Cerebral infarction, unspecified: Secondary | ICD-10-CM | POA: Diagnosis not present

## 2015-09-25 DIAGNOSIS — R488 Other symbolic dysfunctions: Secondary | ICD-10-CM | POA: Diagnosis not present

## 2015-09-25 DIAGNOSIS — I679 Cerebrovascular disease, unspecified: Secondary | ICD-10-CM | POA: Diagnosis not present

## 2015-09-26 DIAGNOSIS — R488 Other symbolic dysfunctions: Secondary | ICD-10-CM | POA: Diagnosis not present

## 2015-09-26 DIAGNOSIS — I679 Cerebrovascular disease, unspecified: Secondary | ICD-10-CM | POA: Diagnosis not present

## 2015-09-26 DIAGNOSIS — I639 Cerebral infarction, unspecified: Secondary | ICD-10-CM | POA: Diagnosis not present

## 2015-09-29 DIAGNOSIS — I639 Cerebral infarction, unspecified: Secondary | ICD-10-CM | POA: Diagnosis not present

## 2015-09-29 DIAGNOSIS — R488 Other symbolic dysfunctions: Secondary | ICD-10-CM | POA: Diagnosis not present

## 2015-09-29 DIAGNOSIS — I679 Cerebrovascular disease, unspecified: Secondary | ICD-10-CM | POA: Diagnosis not present

## 2015-09-30 DIAGNOSIS — I639 Cerebral infarction, unspecified: Secondary | ICD-10-CM | POA: Diagnosis not present

## 2015-09-30 DIAGNOSIS — I679 Cerebrovascular disease, unspecified: Secondary | ICD-10-CM | POA: Diagnosis not present

## 2015-09-30 DIAGNOSIS — R488 Other symbolic dysfunctions: Secondary | ICD-10-CM | POA: Diagnosis not present

## 2015-10-02 DIAGNOSIS — I10 Essential (primary) hypertension: Secondary | ICD-10-CM | POA: Diagnosis not present

## 2015-10-02 DIAGNOSIS — F0391 Unspecified dementia with behavioral disturbance: Secondary | ICD-10-CM | POA: Diagnosis not present

## 2015-10-02 DIAGNOSIS — E46 Unspecified protein-calorie malnutrition: Secondary | ICD-10-CM | POA: Diagnosis not present

## 2015-10-02 DIAGNOSIS — I639 Cerebral infarction, unspecified: Secondary | ICD-10-CM | POA: Diagnosis not present

## 2015-10-16 DIAGNOSIS — R262 Difficulty in walking, not elsewhere classified: Secondary | ICD-10-CM | POA: Diagnosis not present

## 2015-10-16 DIAGNOSIS — M6281 Muscle weakness (generalized): Secondary | ICD-10-CM | POA: Diagnosis not present

## 2015-10-16 DIAGNOSIS — R278 Other lack of coordination: Secondary | ICD-10-CM | POA: Diagnosis not present

## 2015-10-16 DIAGNOSIS — I639 Cerebral infarction, unspecified: Secondary | ICD-10-CM | POA: Diagnosis not present

## 2015-10-16 DIAGNOSIS — F0280 Dementia in other diseases classified elsewhere without behavioral disturbance: Secondary | ICD-10-CM | POA: Diagnosis not present

## 2015-10-17 DIAGNOSIS — R262 Difficulty in walking, not elsewhere classified: Secondary | ICD-10-CM | POA: Diagnosis not present

## 2015-10-17 DIAGNOSIS — F0280 Dementia in other diseases classified elsewhere without behavioral disturbance: Secondary | ICD-10-CM | POA: Diagnosis not present

## 2015-10-17 DIAGNOSIS — I639 Cerebral infarction, unspecified: Secondary | ICD-10-CM | POA: Diagnosis not present

## 2015-10-17 DIAGNOSIS — R278 Other lack of coordination: Secondary | ICD-10-CM | POA: Diagnosis not present

## 2015-10-17 DIAGNOSIS — M6281 Muscle weakness (generalized): Secondary | ICD-10-CM | POA: Diagnosis not present

## 2015-10-20 DIAGNOSIS — F0280 Dementia in other diseases classified elsewhere without behavioral disturbance: Secondary | ICD-10-CM | POA: Diagnosis not present

## 2015-10-20 DIAGNOSIS — I639 Cerebral infarction, unspecified: Secondary | ICD-10-CM | POA: Diagnosis not present

## 2015-10-20 DIAGNOSIS — M6281 Muscle weakness (generalized): Secondary | ICD-10-CM | POA: Diagnosis not present

## 2015-10-20 DIAGNOSIS — B351 Tinea unguium: Secondary | ICD-10-CM | POA: Diagnosis not present

## 2015-10-20 DIAGNOSIS — R278 Other lack of coordination: Secondary | ICD-10-CM | POA: Diagnosis not present

## 2015-10-20 DIAGNOSIS — M79671 Pain in right foot: Secondary | ICD-10-CM | POA: Diagnosis not present

## 2015-10-20 DIAGNOSIS — M79672 Pain in left foot: Secondary | ICD-10-CM | POA: Diagnosis not present

## 2015-10-20 DIAGNOSIS — R262 Difficulty in walking, not elsewhere classified: Secondary | ICD-10-CM | POA: Diagnosis not present

## 2015-10-21 DIAGNOSIS — R278 Other lack of coordination: Secondary | ICD-10-CM | POA: Diagnosis not present

## 2015-10-21 DIAGNOSIS — R262 Difficulty in walking, not elsewhere classified: Secondary | ICD-10-CM | POA: Diagnosis not present

## 2015-10-21 DIAGNOSIS — F0280 Dementia in other diseases classified elsewhere without behavioral disturbance: Secondary | ICD-10-CM | POA: Diagnosis not present

## 2015-10-21 DIAGNOSIS — I639 Cerebral infarction, unspecified: Secondary | ICD-10-CM | POA: Diagnosis not present

## 2015-10-21 DIAGNOSIS — M6281 Muscle weakness (generalized): Secondary | ICD-10-CM | POA: Diagnosis not present

## 2015-10-22 DIAGNOSIS — R262 Difficulty in walking, not elsewhere classified: Secondary | ICD-10-CM | POA: Diagnosis not present

## 2015-10-22 DIAGNOSIS — F0280 Dementia in other diseases classified elsewhere without behavioral disturbance: Secondary | ICD-10-CM | POA: Diagnosis not present

## 2015-10-22 DIAGNOSIS — R278 Other lack of coordination: Secondary | ICD-10-CM | POA: Diagnosis not present

## 2015-10-22 DIAGNOSIS — M6281 Muscle weakness (generalized): Secondary | ICD-10-CM | POA: Diagnosis not present

## 2015-10-22 DIAGNOSIS — I639 Cerebral infarction, unspecified: Secondary | ICD-10-CM | POA: Diagnosis not present

## 2015-10-23 DIAGNOSIS — I639 Cerebral infarction, unspecified: Secondary | ICD-10-CM | POA: Diagnosis not present

## 2015-10-23 DIAGNOSIS — M6281 Muscle weakness (generalized): Secondary | ICD-10-CM | POA: Diagnosis not present

## 2015-10-23 DIAGNOSIS — R262 Difficulty in walking, not elsewhere classified: Secondary | ICD-10-CM | POA: Diagnosis not present

## 2015-10-23 DIAGNOSIS — F0280 Dementia in other diseases classified elsewhere without behavioral disturbance: Secondary | ICD-10-CM | POA: Diagnosis not present

## 2015-10-23 DIAGNOSIS — R278 Other lack of coordination: Secondary | ICD-10-CM | POA: Diagnosis not present

## 2015-10-24 DIAGNOSIS — R278 Other lack of coordination: Secondary | ICD-10-CM | POA: Diagnosis not present

## 2015-10-24 DIAGNOSIS — R262 Difficulty in walking, not elsewhere classified: Secondary | ICD-10-CM | POA: Diagnosis not present

## 2015-10-24 DIAGNOSIS — M6281 Muscle weakness (generalized): Secondary | ICD-10-CM | POA: Diagnosis not present

## 2015-10-24 DIAGNOSIS — F0280 Dementia in other diseases classified elsewhere without behavioral disturbance: Secondary | ICD-10-CM | POA: Diagnosis not present

## 2015-10-24 DIAGNOSIS — I639 Cerebral infarction, unspecified: Secondary | ICD-10-CM | POA: Diagnosis not present

## 2015-10-27 DIAGNOSIS — H04123 Dry eye syndrome of bilateral lacrimal glands: Secondary | ICD-10-CM | POA: Diagnosis not present

## 2015-10-27 DIAGNOSIS — H25813 Combined forms of age-related cataract, bilateral: Secondary | ICD-10-CM | POA: Diagnosis not present

## 2015-10-27 DIAGNOSIS — I639 Cerebral infarction, unspecified: Secondary | ICD-10-CM | POA: Diagnosis not present

## 2015-10-27 DIAGNOSIS — R262 Difficulty in walking, not elsewhere classified: Secondary | ICD-10-CM | POA: Diagnosis not present

## 2015-10-27 DIAGNOSIS — M6281 Muscle weakness (generalized): Secondary | ICD-10-CM | POA: Diagnosis not present

## 2015-10-27 DIAGNOSIS — F0280 Dementia in other diseases classified elsewhere without behavioral disturbance: Secondary | ICD-10-CM | POA: Diagnosis not present

## 2015-10-27 DIAGNOSIS — R278 Other lack of coordination: Secondary | ICD-10-CM | POA: Diagnosis not present

## 2015-10-28 DIAGNOSIS — R262 Difficulty in walking, not elsewhere classified: Secondary | ICD-10-CM | POA: Diagnosis not present

## 2015-10-28 DIAGNOSIS — I639 Cerebral infarction, unspecified: Secondary | ICD-10-CM | POA: Diagnosis not present

## 2015-10-28 DIAGNOSIS — R278 Other lack of coordination: Secondary | ICD-10-CM | POA: Diagnosis not present

## 2015-10-28 DIAGNOSIS — M6281 Muscle weakness (generalized): Secondary | ICD-10-CM | POA: Diagnosis not present

## 2015-10-28 DIAGNOSIS — F0280 Dementia in other diseases classified elsewhere without behavioral disturbance: Secondary | ICD-10-CM | POA: Diagnosis not present

## 2015-10-29 DIAGNOSIS — R262 Difficulty in walking, not elsewhere classified: Secondary | ICD-10-CM | POA: Diagnosis not present

## 2015-10-29 DIAGNOSIS — R278 Other lack of coordination: Secondary | ICD-10-CM | POA: Diagnosis not present

## 2015-10-29 DIAGNOSIS — F0280 Dementia in other diseases classified elsewhere without behavioral disturbance: Secondary | ICD-10-CM | POA: Diagnosis not present

## 2015-10-29 DIAGNOSIS — I639 Cerebral infarction, unspecified: Secondary | ICD-10-CM | POA: Diagnosis not present

## 2015-10-29 DIAGNOSIS — M6281 Muscle weakness (generalized): Secondary | ICD-10-CM | POA: Diagnosis not present

## 2015-10-30 DIAGNOSIS — R262 Difficulty in walking, not elsewhere classified: Secondary | ICD-10-CM | POA: Diagnosis not present

## 2015-10-30 DIAGNOSIS — R278 Other lack of coordination: Secondary | ICD-10-CM | POA: Diagnosis not present

## 2015-10-30 DIAGNOSIS — F0280 Dementia in other diseases classified elsewhere without behavioral disturbance: Secondary | ICD-10-CM | POA: Diagnosis not present

## 2015-10-30 DIAGNOSIS — I639 Cerebral infarction, unspecified: Secondary | ICD-10-CM | POA: Diagnosis not present

## 2015-10-30 DIAGNOSIS — M6281 Muscle weakness (generalized): Secondary | ICD-10-CM | POA: Diagnosis not present

## 2015-10-31 DIAGNOSIS — R262 Difficulty in walking, not elsewhere classified: Secondary | ICD-10-CM | POA: Diagnosis not present

## 2015-10-31 DIAGNOSIS — F0280 Dementia in other diseases classified elsewhere without behavioral disturbance: Secondary | ICD-10-CM | POA: Diagnosis not present

## 2015-10-31 DIAGNOSIS — R278 Other lack of coordination: Secondary | ICD-10-CM | POA: Diagnosis not present

## 2015-10-31 DIAGNOSIS — I639 Cerebral infarction, unspecified: Secondary | ICD-10-CM | POA: Diagnosis not present

## 2015-10-31 DIAGNOSIS — M6281 Muscle weakness (generalized): Secondary | ICD-10-CM | POA: Diagnosis not present

## 2015-11-04 DIAGNOSIS — F0280 Dementia in other diseases classified elsewhere without behavioral disturbance: Secondary | ICD-10-CM | POA: Diagnosis not present

## 2015-11-04 DIAGNOSIS — I639 Cerebral infarction, unspecified: Secondary | ICD-10-CM | POA: Diagnosis not present

## 2015-11-04 DIAGNOSIS — R278 Other lack of coordination: Secondary | ICD-10-CM | POA: Diagnosis not present

## 2015-11-04 DIAGNOSIS — M6281 Muscle weakness (generalized): Secondary | ICD-10-CM | POA: Diagnosis not present

## 2015-11-04 DIAGNOSIS — R262 Difficulty in walking, not elsewhere classified: Secondary | ICD-10-CM | POA: Diagnosis not present

## 2015-11-05 DIAGNOSIS — R262 Difficulty in walking, not elsewhere classified: Secondary | ICD-10-CM | POA: Diagnosis not present

## 2015-11-05 DIAGNOSIS — F0280 Dementia in other diseases classified elsewhere without behavioral disturbance: Secondary | ICD-10-CM | POA: Diagnosis not present

## 2015-11-05 DIAGNOSIS — I639 Cerebral infarction, unspecified: Secondary | ICD-10-CM | POA: Diagnosis not present

## 2015-11-05 DIAGNOSIS — M6281 Muscle weakness (generalized): Secondary | ICD-10-CM | POA: Diagnosis not present

## 2015-11-05 DIAGNOSIS — R278 Other lack of coordination: Secondary | ICD-10-CM | POA: Diagnosis not present

## 2015-11-06 DIAGNOSIS — I10 Essential (primary) hypertension: Secondary | ICD-10-CM | POA: Diagnosis not present

## 2015-11-06 DIAGNOSIS — F0280 Dementia in other diseases classified elsewhere without behavioral disturbance: Secondary | ICD-10-CM | POA: Diagnosis not present

## 2015-11-06 DIAGNOSIS — I639 Cerebral infarction, unspecified: Secondary | ICD-10-CM | POA: Diagnosis not present

## 2015-11-06 DIAGNOSIS — M6281 Muscle weakness (generalized): Secondary | ICD-10-CM | POA: Diagnosis not present

## 2015-11-06 DIAGNOSIS — R262 Difficulty in walking, not elsewhere classified: Secondary | ICD-10-CM | POA: Diagnosis not present

## 2015-11-06 DIAGNOSIS — R278 Other lack of coordination: Secondary | ICD-10-CM | POA: Diagnosis not present

## 2015-11-07 DIAGNOSIS — R278 Other lack of coordination: Secondary | ICD-10-CM | POA: Diagnosis not present

## 2015-11-07 DIAGNOSIS — F0280 Dementia in other diseases classified elsewhere without behavioral disturbance: Secondary | ICD-10-CM | POA: Diagnosis not present

## 2015-11-07 DIAGNOSIS — R262 Difficulty in walking, not elsewhere classified: Secondary | ICD-10-CM | POA: Diagnosis not present

## 2015-11-07 DIAGNOSIS — M6281 Muscle weakness (generalized): Secondary | ICD-10-CM | POA: Diagnosis not present

## 2015-11-07 DIAGNOSIS — I639 Cerebral infarction, unspecified: Secondary | ICD-10-CM | POA: Diagnosis not present

## 2015-11-10 DIAGNOSIS — M6281 Muscle weakness (generalized): Secondary | ICD-10-CM | POA: Diagnosis not present

## 2015-11-10 DIAGNOSIS — R278 Other lack of coordination: Secondary | ICD-10-CM | POA: Diagnosis not present

## 2015-11-10 DIAGNOSIS — I639 Cerebral infarction, unspecified: Secondary | ICD-10-CM | POA: Diagnosis not present

## 2015-11-10 DIAGNOSIS — F0280 Dementia in other diseases classified elsewhere without behavioral disturbance: Secondary | ICD-10-CM | POA: Diagnosis not present

## 2015-11-10 DIAGNOSIS — R262 Difficulty in walking, not elsewhere classified: Secondary | ICD-10-CM | POA: Diagnosis not present

## 2015-11-11 DIAGNOSIS — R278 Other lack of coordination: Secondary | ICD-10-CM | POA: Diagnosis not present

## 2015-11-11 DIAGNOSIS — M6281 Muscle weakness (generalized): Secondary | ICD-10-CM | POA: Diagnosis not present

## 2015-11-11 DIAGNOSIS — R262 Difficulty in walking, not elsewhere classified: Secondary | ICD-10-CM | POA: Diagnosis not present

## 2015-11-11 DIAGNOSIS — I639 Cerebral infarction, unspecified: Secondary | ICD-10-CM | POA: Diagnosis not present

## 2015-11-11 DIAGNOSIS — F0280 Dementia in other diseases classified elsewhere without behavioral disturbance: Secondary | ICD-10-CM | POA: Diagnosis not present

## 2015-11-12 DIAGNOSIS — I639 Cerebral infarction, unspecified: Secondary | ICD-10-CM | POA: Diagnosis not present

## 2015-11-12 DIAGNOSIS — F0280 Dementia in other diseases classified elsewhere without behavioral disturbance: Secondary | ICD-10-CM | POA: Diagnosis not present

## 2015-11-12 DIAGNOSIS — R262 Difficulty in walking, not elsewhere classified: Secondary | ICD-10-CM | POA: Diagnosis not present

## 2015-11-12 DIAGNOSIS — M6281 Muscle weakness (generalized): Secondary | ICD-10-CM | POA: Diagnosis not present

## 2015-11-12 DIAGNOSIS — R278 Other lack of coordination: Secondary | ICD-10-CM | POA: Diagnosis not present

## 2015-11-13 DIAGNOSIS — R262 Difficulty in walking, not elsewhere classified: Secondary | ICD-10-CM | POA: Diagnosis not present

## 2015-11-13 DIAGNOSIS — I639 Cerebral infarction, unspecified: Secondary | ICD-10-CM | POA: Diagnosis not present

## 2015-11-13 DIAGNOSIS — M6281 Muscle weakness (generalized): Secondary | ICD-10-CM | POA: Diagnosis not present

## 2015-11-13 DIAGNOSIS — F0280 Dementia in other diseases classified elsewhere without behavioral disturbance: Secondary | ICD-10-CM | POA: Diagnosis not present

## 2015-11-13 DIAGNOSIS — R278 Other lack of coordination: Secondary | ICD-10-CM | POA: Diagnosis not present

## 2015-11-15 DIAGNOSIS — I639 Cerebral infarction, unspecified: Secondary | ICD-10-CM | POA: Diagnosis not present

## 2015-11-15 DIAGNOSIS — R262 Difficulty in walking, not elsewhere classified: Secondary | ICD-10-CM | POA: Diagnosis not present

## 2015-11-15 DIAGNOSIS — M6281 Muscle weakness (generalized): Secondary | ICD-10-CM | POA: Diagnosis not present

## 2015-11-15 DIAGNOSIS — R278 Other lack of coordination: Secondary | ICD-10-CM | POA: Diagnosis not present

## 2015-11-15 DIAGNOSIS — F0280 Dementia in other diseases classified elsewhere without behavioral disturbance: Secondary | ICD-10-CM | POA: Diagnosis not present

## 2015-11-17 DIAGNOSIS — F0280 Dementia in other diseases classified elsewhere without behavioral disturbance: Secondary | ICD-10-CM | POA: Diagnosis not present

## 2015-11-17 DIAGNOSIS — M6281 Muscle weakness (generalized): Secondary | ICD-10-CM | POA: Diagnosis not present

## 2015-11-17 DIAGNOSIS — I639 Cerebral infarction, unspecified: Secondary | ICD-10-CM | POA: Diagnosis not present

## 2015-11-17 DIAGNOSIS — R262 Difficulty in walking, not elsewhere classified: Secondary | ICD-10-CM | POA: Diagnosis not present

## 2015-11-17 DIAGNOSIS — R278 Other lack of coordination: Secondary | ICD-10-CM | POA: Diagnosis not present

## 2015-11-19 DIAGNOSIS — R262 Difficulty in walking, not elsewhere classified: Secondary | ICD-10-CM | POA: Diagnosis not present

## 2015-11-19 DIAGNOSIS — I639 Cerebral infarction, unspecified: Secondary | ICD-10-CM | POA: Diagnosis not present

## 2015-11-19 DIAGNOSIS — M6281 Muscle weakness (generalized): Secondary | ICD-10-CM | POA: Diagnosis not present

## 2015-11-19 DIAGNOSIS — R278 Other lack of coordination: Secondary | ICD-10-CM | POA: Diagnosis not present

## 2015-11-19 DIAGNOSIS — F0280 Dementia in other diseases classified elsewhere without behavioral disturbance: Secondary | ICD-10-CM | POA: Diagnosis not present

## 2015-11-20 DIAGNOSIS — R262 Difficulty in walking, not elsewhere classified: Secondary | ICD-10-CM | POA: Diagnosis not present

## 2015-11-20 DIAGNOSIS — M6281 Muscle weakness (generalized): Secondary | ICD-10-CM | POA: Diagnosis not present

## 2015-11-20 DIAGNOSIS — R278 Other lack of coordination: Secondary | ICD-10-CM | POA: Diagnosis not present

## 2015-11-20 DIAGNOSIS — F419 Anxiety disorder, unspecified: Secondary | ICD-10-CM | POA: Diagnosis not present

## 2015-11-20 DIAGNOSIS — F329 Major depressive disorder, single episode, unspecified: Secondary | ICD-10-CM | POA: Diagnosis not present

## 2015-11-20 DIAGNOSIS — F0280 Dementia in other diseases classified elsewhere without behavioral disturbance: Secondary | ICD-10-CM | POA: Diagnosis not present

## 2015-11-20 DIAGNOSIS — I639 Cerebral infarction, unspecified: Secondary | ICD-10-CM | POA: Diagnosis not present

## 2015-11-20 DIAGNOSIS — F0390 Unspecified dementia without behavioral disturbance: Secondary | ICD-10-CM | POA: Diagnosis not present

## 2015-11-21 DIAGNOSIS — M6281 Muscle weakness (generalized): Secondary | ICD-10-CM | POA: Diagnosis not present

## 2015-11-21 DIAGNOSIS — R278 Other lack of coordination: Secondary | ICD-10-CM | POA: Diagnosis not present

## 2015-11-21 DIAGNOSIS — F0280 Dementia in other diseases classified elsewhere without behavioral disturbance: Secondary | ICD-10-CM | POA: Diagnosis not present

## 2015-11-21 DIAGNOSIS — R262 Difficulty in walking, not elsewhere classified: Secondary | ICD-10-CM | POA: Diagnosis not present

## 2015-11-21 DIAGNOSIS — I639 Cerebral infarction, unspecified: Secondary | ICD-10-CM | POA: Diagnosis not present

## 2015-11-22 DIAGNOSIS — R278 Other lack of coordination: Secondary | ICD-10-CM | POA: Diagnosis not present

## 2015-11-22 DIAGNOSIS — R262 Difficulty in walking, not elsewhere classified: Secondary | ICD-10-CM | POA: Diagnosis not present

## 2015-11-22 DIAGNOSIS — F0280 Dementia in other diseases classified elsewhere without behavioral disturbance: Secondary | ICD-10-CM | POA: Diagnosis not present

## 2015-11-22 DIAGNOSIS — I639 Cerebral infarction, unspecified: Secondary | ICD-10-CM | POA: Diagnosis not present

## 2015-11-22 DIAGNOSIS — M6281 Muscle weakness (generalized): Secondary | ICD-10-CM | POA: Diagnosis not present

## 2015-11-24 DIAGNOSIS — F0280 Dementia in other diseases classified elsewhere without behavioral disturbance: Secondary | ICD-10-CM | POA: Diagnosis not present

## 2015-11-24 DIAGNOSIS — I639 Cerebral infarction, unspecified: Secondary | ICD-10-CM | POA: Diagnosis not present

## 2015-11-24 DIAGNOSIS — R278 Other lack of coordination: Secondary | ICD-10-CM | POA: Diagnosis not present

## 2015-11-24 DIAGNOSIS — R262 Difficulty in walking, not elsewhere classified: Secondary | ICD-10-CM | POA: Diagnosis not present

## 2015-11-24 DIAGNOSIS — M6281 Muscle weakness (generalized): Secondary | ICD-10-CM | POA: Diagnosis not present

## 2015-11-25 DIAGNOSIS — F0280 Dementia in other diseases classified elsewhere without behavioral disturbance: Secondary | ICD-10-CM | POA: Diagnosis not present

## 2015-11-25 DIAGNOSIS — R262 Difficulty in walking, not elsewhere classified: Secondary | ICD-10-CM | POA: Diagnosis not present

## 2015-11-25 DIAGNOSIS — R278 Other lack of coordination: Secondary | ICD-10-CM | POA: Diagnosis not present

## 2015-11-25 DIAGNOSIS — I639 Cerebral infarction, unspecified: Secondary | ICD-10-CM | POA: Diagnosis not present

## 2015-11-25 DIAGNOSIS — M6281 Muscle weakness (generalized): Secondary | ICD-10-CM | POA: Diagnosis not present

## 2015-11-26 DIAGNOSIS — R278 Other lack of coordination: Secondary | ICD-10-CM | POA: Diagnosis not present

## 2015-11-26 DIAGNOSIS — M6281 Muscle weakness (generalized): Secondary | ICD-10-CM | POA: Diagnosis not present

## 2015-11-26 DIAGNOSIS — F0280 Dementia in other diseases classified elsewhere without behavioral disturbance: Secondary | ICD-10-CM | POA: Diagnosis not present

## 2015-11-26 DIAGNOSIS — I639 Cerebral infarction, unspecified: Secondary | ICD-10-CM | POA: Diagnosis not present

## 2015-11-26 DIAGNOSIS — I10 Essential (primary) hypertension: Secondary | ICD-10-CM | POA: Diagnosis not present

## 2015-11-26 DIAGNOSIS — R262 Difficulty in walking, not elsewhere classified: Secondary | ICD-10-CM | POA: Diagnosis not present

## 2015-11-27 DIAGNOSIS — N400 Enlarged prostate without lower urinary tract symptoms: Secondary | ICD-10-CM | POA: Diagnosis not present

## 2015-11-27 DIAGNOSIS — I639 Cerebral infarction, unspecified: Secondary | ICD-10-CM | POA: Diagnosis not present

## 2015-11-27 DIAGNOSIS — F0280 Dementia in other diseases classified elsewhere without behavioral disturbance: Secondary | ICD-10-CM | POA: Diagnosis not present

## 2015-11-27 DIAGNOSIS — M6281 Muscle weakness (generalized): Secondary | ICD-10-CM | POA: Diagnosis not present

## 2015-11-27 DIAGNOSIS — R262 Difficulty in walking, not elsewhere classified: Secondary | ICD-10-CM | POA: Diagnosis not present

## 2015-11-27 DIAGNOSIS — I10 Essential (primary) hypertension: Secondary | ICD-10-CM | POA: Diagnosis not present

## 2015-11-27 DIAGNOSIS — I2510 Atherosclerotic heart disease of native coronary artery without angina pectoris: Secondary | ICD-10-CM | POA: Diagnosis not present

## 2015-11-27 DIAGNOSIS — R278 Other lack of coordination: Secondary | ICD-10-CM | POA: Diagnosis not present

## 2015-11-27 DIAGNOSIS — F0391 Unspecified dementia with behavioral disturbance: Secondary | ICD-10-CM | POA: Diagnosis not present

## 2015-11-28 DIAGNOSIS — R262 Difficulty in walking, not elsewhere classified: Secondary | ICD-10-CM | POA: Diagnosis not present

## 2015-11-28 DIAGNOSIS — M6281 Muscle weakness (generalized): Secondary | ICD-10-CM | POA: Diagnosis not present

## 2015-11-28 DIAGNOSIS — R278 Other lack of coordination: Secondary | ICD-10-CM | POA: Diagnosis not present

## 2015-11-28 DIAGNOSIS — I639 Cerebral infarction, unspecified: Secondary | ICD-10-CM | POA: Diagnosis not present

## 2015-11-28 DIAGNOSIS — F0280 Dementia in other diseases classified elsewhere without behavioral disturbance: Secondary | ICD-10-CM | POA: Diagnosis not present

## 2015-12-01 DIAGNOSIS — I639 Cerebral infarction, unspecified: Secondary | ICD-10-CM | POA: Diagnosis not present

## 2015-12-01 DIAGNOSIS — M6281 Muscle weakness (generalized): Secondary | ICD-10-CM | POA: Diagnosis not present

## 2015-12-01 DIAGNOSIS — F0280 Dementia in other diseases classified elsewhere without behavioral disturbance: Secondary | ICD-10-CM | POA: Diagnosis not present

## 2015-12-01 DIAGNOSIS — R278 Other lack of coordination: Secondary | ICD-10-CM | POA: Diagnosis not present

## 2015-12-01 DIAGNOSIS — R262 Difficulty in walking, not elsewhere classified: Secondary | ICD-10-CM | POA: Diagnosis not present

## 2015-12-03 DIAGNOSIS — I639 Cerebral infarction, unspecified: Secondary | ICD-10-CM | POA: Diagnosis not present

## 2015-12-03 DIAGNOSIS — M6281 Muscle weakness (generalized): Secondary | ICD-10-CM | POA: Diagnosis not present

## 2015-12-03 DIAGNOSIS — F0280 Dementia in other diseases classified elsewhere without behavioral disturbance: Secondary | ICD-10-CM | POA: Diagnosis not present

## 2015-12-03 DIAGNOSIS — R262 Difficulty in walking, not elsewhere classified: Secondary | ICD-10-CM | POA: Diagnosis not present

## 2015-12-03 DIAGNOSIS — R278 Other lack of coordination: Secondary | ICD-10-CM | POA: Diagnosis not present

## 2015-12-04 DIAGNOSIS — F0280 Dementia in other diseases classified elsewhere without behavioral disturbance: Secondary | ICD-10-CM | POA: Diagnosis not present

## 2015-12-04 DIAGNOSIS — M6281 Muscle weakness (generalized): Secondary | ICD-10-CM | POA: Diagnosis not present

## 2015-12-04 DIAGNOSIS — R278 Other lack of coordination: Secondary | ICD-10-CM | POA: Diagnosis not present

## 2015-12-04 DIAGNOSIS — I639 Cerebral infarction, unspecified: Secondary | ICD-10-CM | POA: Diagnosis not present

## 2015-12-04 DIAGNOSIS — R262 Difficulty in walking, not elsewhere classified: Secondary | ICD-10-CM | POA: Diagnosis not present

## 2015-12-05 DIAGNOSIS — R278 Other lack of coordination: Secondary | ICD-10-CM | POA: Diagnosis not present

## 2015-12-05 DIAGNOSIS — R262 Difficulty in walking, not elsewhere classified: Secondary | ICD-10-CM | POA: Diagnosis not present

## 2015-12-05 DIAGNOSIS — M6281 Muscle weakness (generalized): Secondary | ICD-10-CM | POA: Diagnosis not present

## 2015-12-05 DIAGNOSIS — I639 Cerebral infarction, unspecified: Secondary | ICD-10-CM | POA: Diagnosis not present

## 2015-12-05 DIAGNOSIS — F0280 Dementia in other diseases classified elsewhere without behavioral disturbance: Secondary | ICD-10-CM | POA: Diagnosis not present

## 2015-12-06 DIAGNOSIS — I639 Cerebral infarction, unspecified: Secondary | ICD-10-CM | POA: Diagnosis not present

## 2015-12-06 DIAGNOSIS — M6281 Muscle weakness (generalized): Secondary | ICD-10-CM | POA: Diagnosis not present

## 2015-12-06 DIAGNOSIS — R262 Difficulty in walking, not elsewhere classified: Secondary | ICD-10-CM | POA: Diagnosis not present

## 2015-12-06 DIAGNOSIS — F0280 Dementia in other diseases classified elsewhere without behavioral disturbance: Secondary | ICD-10-CM | POA: Diagnosis not present

## 2015-12-06 DIAGNOSIS — R278 Other lack of coordination: Secondary | ICD-10-CM | POA: Diagnosis not present

## 2015-12-08 DIAGNOSIS — F0280 Dementia in other diseases classified elsewhere without behavioral disturbance: Secondary | ICD-10-CM | POA: Diagnosis not present

## 2015-12-08 DIAGNOSIS — M6281 Muscle weakness (generalized): Secondary | ICD-10-CM | POA: Diagnosis not present

## 2015-12-08 DIAGNOSIS — R262 Difficulty in walking, not elsewhere classified: Secondary | ICD-10-CM | POA: Diagnosis not present

## 2015-12-08 DIAGNOSIS — R278 Other lack of coordination: Secondary | ICD-10-CM | POA: Diagnosis not present

## 2015-12-08 DIAGNOSIS — I639 Cerebral infarction, unspecified: Secondary | ICD-10-CM | POA: Diagnosis not present

## 2015-12-09 DIAGNOSIS — R262 Difficulty in walking, not elsewhere classified: Secondary | ICD-10-CM | POA: Diagnosis not present

## 2015-12-09 DIAGNOSIS — F0280 Dementia in other diseases classified elsewhere without behavioral disturbance: Secondary | ICD-10-CM | POA: Diagnosis not present

## 2015-12-09 DIAGNOSIS — I639 Cerebral infarction, unspecified: Secondary | ICD-10-CM | POA: Diagnosis not present

## 2015-12-09 DIAGNOSIS — M6281 Muscle weakness (generalized): Secondary | ICD-10-CM | POA: Diagnosis not present

## 2015-12-09 DIAGNOSIS — R278 Other lack of coordination: Secondary | ICD-10-CM | POA: Diagnosis not present

## 2015-12-10 DIAGNOSIS — I639 Cerebral infarction, unspecified: Secondary | ICD-10-CM | POA: Diagnosis not present

## 2015-12-10 DIAGNOSIS — F0280 Dementia in other diseases classified elsewhere without behavioral disturbance: Secondary | ICD-10-CM | POA: Diagnosis not present

## 2015-12-10 DIAGNOSIS — R262 Difficulty in walking, not elsewhere classified: Secondary | ICD-10-CM | POA: Diagnosis not present

## 2015-12-10 DIAGNOSIS — M6281 Muscle weakness (generalized): Secondary | ICD-10-CM | POA: Diagnosis not present

## 2015-12-10 DIAGNOSIS — R278 Other lack of coordination: Secondary | ICD-10-CM | POA: Diagnosis not present

## 2015-12-11 DIAGNOSIS — R262 Difficulty in walking, not elsewhere classified: Secondary | ICD-10-CM | POA: Diagnosis not present

## 2015-12-11 DIAGNOSIS — R278 Other lack of coordination: Secondary | ICD-10-CM | POA: Diagnosis not present

## 2015-12-11 DIAGNOSIS — F0280 Dementia in other diseases classified elsewhere without behavioral disturbance: Secondary | ICD-10-CM | POA: Diagnosis not present

## 2015-12-11 DIAGNOSIS — M6281 Muscle weakness (generalized): Secondary | ICD-10-CM | POA: Diagnosis not present

## 2015-12-11 DIAGNOSIS — I639 Cerebral infarction, unspecified: Secondary | ICD-10-CM | POA: Diagnosis not present

## 2015-12-12 DIAGNOSIS — F0280 Dementia in other diseases classified elsewhere without behavioral disturbance: Secondary | ICD-10-CM | POA: Diagnosis not present

## 2015-12-12 DIAGNOSIS — R278 Other lack of coordination: Secondary | ICD-10-CM | POA: Diagnosis not present

## 2015-12-12 DIAGNOSIS — M6281 Muscle weakness (generalized): Secondary | ICD-10-CM | POA: Diagnosis not present

## 2015-12-12 DIAGNOSIS — R262 Difficulty in walking, not elsewhere classified: Secondary | ICD-10-CM | POA: Diagnosis not present

## 2015-12-12 DIAGNOSIS — I639 Cerebral infarction, unspecified: Secondary | ICD-10-CM | POA: Diagnosis not present

## 2015-12-15 DIAGNOSIS — I639 Cerebral infarction, unspecified: Secondary | ICD-10-CM | POA: Diagnosis not present

## 2015-12-15 DIAGNOSIS — F0280 Dementia in other diseases classified elsewhere without behavioral disturbance: Secondary | ICD-10-CM | POA: Diagnosis not present

## 2015-12-15 DIAGNOSIS — M6281 Muscle weakness (generalized): Secondary | ICD-10-CM | POA: Diagnosis not present

## 2015-12-15 DIAGNOSIS — R262 Difficulty in walking, not elsewhere classified: Secondary | ICD-10-CM | POA: Diagnosis not present

## 2015-12-15 DIAGNOSIS — R278 Other lack of coordination: Secondary | ICD-10-CM | POA: Diagnosis not present

## 2015-12-16 DIAGNOSIS — R262 Difficulty in walking, not elsewhere classified: Secondary | ICD-10-CM | POA: Diagnosis not present

## 2015-12-16 DIAGNOSIS — F0280 Dementia in other diseases classified elsewhere without behavioral disturbance: Secondary | ICD-10-CM | POA: Diagnosis not present

## 2015-12-17 DIAGNOSIS — F0280 Dementia in other diseases classified elsewhere without behavioral disturbance: Secondary | ICD-10-CM | POA: Diagnosis not present

## 2015-12-17 DIAGNOSIS — R262 Difficulty in walking, not elsewhere classified: Secondary | ICD-10-CM | POA: Diagnosis not present

## 2015-12-18 DIAGNOSIS — F0280 Dementia in other diseases classified elsewhere without behavioral disturbance: Secondary | ICD-10-CM | POA: Diagnosis not present

## 2015-12-18 DIAGNOSIS — R262 Difficulty in walking, not elsewhere classified: Secondary | ICD-10-CM | POA: Diagnosis not present

## 2015-12-19 DIAGNOSIS — F0280 Dementia in other diseases classified elsewhere without behavioral disturbance: Secondary | ICD-10-CM | POA: Diagnosis not present

## 2015-12-19 DIAGNOSIS — R262 Difficulty in walking, not elsewhere classified: Secondary | ICD-10-CM | POA: Diagnosis not present

## 2015-12-22 DIAGNOSIS — R262 Difficulty in walking, not elsewhere classified: Secondary | ICD-10-CM | POA: Diagnosis not present

## 2015-12-22 DIAGNOSIS — F0280 Dementia in other diseases classified elsewhere without behavioral disturbance: Secondary | ICD-10-CM | POA: Diagnosis not present

## 2015-12-23 DIAGNOSIS — F0280 Dementia in other diseases classified elsewhere without behavioral disturbance: Secondary | ICD-10-CM | POA: Diagnosis not present

## 2015-12-23 DIAGNOSIS — R262 Difficulty in walking, not elsewhere classified: Secondary | ICD-10-CM | POA: Diagnosis not present

## 2015-12-24 DIAGNOSIS — F0280 Dementia in other diseases classified elsewhere without behavioral disturbance: Secondary | ICD-10-CM | POA: Diagnosis not present

## 2015-12-24 DIAGNOSIS — R262 Difficulty in walking, not elsewhere classified: Secondary | ICD-10-CM | POA: Diagnosis not present

## 2015-12-25 DIAGNOSIS — F0280 Dementia in other diseases classified elsewhere without behavioral disturbance: Secondary | ICD-10-CM | POA: Diagnosis not present

## 2015-12-25 DIAGNOSIS — I2510 Atherosclerotic heart disease of native coronary artery without angina pectoris: Secondary | ICD-10-CM | POA: Diagnosis not present

## 2015-12-25 DIAGNOSIS — N400 Enlarged prostate without lower urinary tract symptoms: Secondary | ICD-10-CM | POA: Diagnosis not present

## 2015-12-25 DIAGNOSIS — F0391 Unspecified dementia with behavioral disturbance: Secondary | ICD-10-CM | POA: Diagnosis not present

## 2015-12-25 DIAGNOSIS — R262 Difficulty in walking, not elsewhere classified: Secondary | ICD-10-CM | POA: Diagnosis not present

## 2015-12-25 DIAGNOSIS — I10 Essential (primary) hypertension: Secondary | ICD-10-CM | POA: Diagnosis not present

## 2015-12-25 DIAGNOSIS — I639 Cerebral infarction, unspecified: Secondary | ICD-10-CM | POA: Diagnosis not present

## 2016-01-29 DIAGNOSIS — F419 Anxiety disorder, unspecified: Secondary | ICD-10-CM | POA: Diagnosis not present

## 2016-01-29 DIAGNOSIS — F329 Major depressive disorder, single episode, unspecified: Secondary | ICD-10-CM | POA: Diagnosis not present

## 2016-01-29 DIAGNOSIS — F0390 Unspecified dementia without behavioral disturbance: Secondary | ICD-10-CM | POA: Diagnosis not present

## 2016-02-19 DIAGNOSIS — B351 Tinea unguium: Secondary | ICD-10-CM | POA: Diagnosis not present

## 2016-02-19 DIAGNOSIS — I70203 Unspecified atherosclerosis of native arteries of extremities, bilateral legs: Secondary | ICD-10-CM | POA: Diagnosis not present

## 2016-02-19 DIAGNOSIS — M79674 Pain in right toe(s): Secondary | ICD-10-CM | POA: Diagnosis not present

## 2016-02-19 DIAGNOSIS — I10 Essential (primary) hypertension: Secondary | ICD-10-CM | POA: Diagnosis not present

## 2016-02-19 DIAGNOSIS — M79675 Pain in left toe(s): Secondary | ICD-10-CM | POA: Diagnosis not present

## 2016-02-19 DIAGNOSIS — N400 Enlarged prostate without lower urinary tract symptoms: Secondary | ICD-10-CM | POA: Diagnosis not present

## 2016-02-19 DIAGNOSIS — I639 Cerebral infarction, unspecified: Secondary | ICD-10-CM | POA: Diagnosis not present

## 2016-02-19 DIAGNOSIS — F0391 Unspecified dementia with behavioral disturbance: Secondary | ICD-10-CM | POA: Diagnosis not present

## 2016-03-11 DIAGNOSIS — I639 Cerebral infarction, unspecified: Secondary | ICD-10-CM | POA: Diagnosis not present

## 2016-03-11 DIAGNOSIS — R278 Other lack of coordination: Secondary | ICD-10-CM | POA: Diagnosis not present

## 2016-03-11 DIAGNOSIS — R410 Disorientation, unspecified: Secondary | ICD-10-CM | POA: Diagnosis not present

## 2016-03-11 DIAGNOSIS — R41841 Cognitive communication deficit: Secondary | ICD-10-CM | POA: Diagnosis not present

## 2016-03-11 DIAGNOSIS — G9340 Encephalopathy, unspecified: Secondary | ICD-10-CM | POA: Diagnosis not present

## 2016-03-12 DIAGNOSIS — R41841 Cognitive communication deficit: Secondary | ICD-10-CM | POA: Diagnosis not present

## 2016-03-12 DIAGNOSIS — G9340 Encephalopathy, unspecified: Secondary | ICD-10-CM | POA: Diagnosis not present

## 2016-03-12 DIAGNOSIS — I639 Cerebral infarction, unspecified: Secondary | ICD-10-CM | POA: Diagnosis not present

## 2016-03-12 DIAGNOSIS — R278 Other lack of coordination: Secondary | ICD-10-CM | POA: Diagnosis not present

## 2016-03-12 DIAGNOSIS — R410 Disorientation, unspecified: Secondary | ICD-10-CM | POA: Diagnosis not present

## 2016-03-15 DIAGNOSIS — I639 Cerebral infarction, unspecified: Secondary | ICD-10-CM | POA: Diagnosis not present

## 2016-03-15 DIAGNOSIS — R278 Other lack of coordination: Secondary | ICD-10-CM | POA: Diagnosis not present

## 2016-03-15 DIAGNOSIS — R41841 Cognitive communication deficit: Secondary | ICD-10-CM | POA: Diagnosis not present

## 2016-03-15 DIAGNOSIS — G9340 Encephalopathy, unspecified: Secondary | ICD-10-CM | POA: Diagnosis not present

## 2016-03-15 DIAGNOSIS — R410 Disorientation, unspecified: Secondary | ICD-10-CM | POA: Diagnosis not present

## 2016-03-17 DIAGNOSIS — R410 Disorientation, unspecified: Secondary | ICD-10-CM | POA: Diagnosis not present

## 2016-03-17 DIAGNOSIS — G9340 Encephalopathy, unspecified: Secondary | ICD-10-CM | POA: Diagnosis not present

## 2016-03-17 DIAGNOSIS — R41841 Cognitive communication deficit: Secondary | ICD-10-CM | POA: Diagnosis not present

## 2016-03-17 DIAGNOSIS — I639 Cerebral infarction, unspecified: Secondary | ICD-10-CM | POA: Diagnosis not present

## 2016-03-17 DIAGNOSIS — R278 Other lack of coordination: Secondary | ICD-10-CM | POA: Diagnosis not present

## 2016-03-19 DIAGNOSIS — R410 Disorientation, unspecified: Secondary | ICD-10-CM | POA: Diagnosis not present

## 2016-03-19 DIAGNOSIS — R278 Other lack of coordination: Secondary | ICD-10-CM | POA: Diagnosis not present

## 2016-03-19 DIAGNOSIS — I639 Cerebral infarction, unspecified: Secondary | ICD-10-CM | POA: Diagnosis not present

## 2016-03-19 DIAGNOSIS — G9340 Encephalopathy, unspecified: Secondary | ICD-10-CM | POA: Diagnosis not present

## 2016-03-19 DIAGNOSIS — R41841 Cognitive communication deficit: Secondary | ICD-10-CM | POA: Diagnosis not present

## 2016-03-22 DIAGNOSIS — R41841 Cognitive communication deficit: Secondary | ICD-10-CM | POA: Diagnosis not present

## 2016-03-22 DIAGNOSIS — R410 Disorientation, unspecified: Secondary | ICD-10-CM | POA: Diagnosis not present

## 2016-03-22 DIAGNOSIS — R278 Other lack of coordination: Secondary | ICD-10-CM | POA: Diagnosis not present

## 2016-03-22 DIAGNOSIS — G9340 Encephalopathy, unspecified: Secondary | ICD-10-CM | POA: Diagnosis not present

## 2016-03-22 DIAGNOSIS — I639 Cerebral infarction, unspecified: Secondary | ICD-10-CM | POA: Diagnosis not present

## 2016-03-23 DIAGNOSIS — I639 Cerebral infarction, unspecified: Secondary | ICD-10-CM | POA: Diagnosis not present

## 2016-03-23 DIAGNOSIS — G9340 Encephalopathy, unspecified: Secondary | ICD-10-CM | POA: Diagnosis not present

## 2016-03-23 DIAGNOSIS — R410 Disorientation, unspecified: Secondary | ICD-10-CM | POA: Diagnosis not present

## 2016-03-23 DIAGNOSIS — R278 Other lack of coordination: Secondary | ICD-10-CM | POA: Diagnosis not present

## 2016-03-23 DIAGNOSIS — R41841 Cognitive communication deficit: Secondary | ICD-10-CM | POA: Diagnosis not present

## 2016-03-24 DIAGNOSIS — R410 Disorientation, unspecified: Secondary | ICD-10-CM | POA: Diagnosis not present

## 2016-03-24 DIAGNOSIS — I639 Cerebral infarction, unspecified: Secondary | ICD-10-CM | POA: Diagnosis not present

## 2016-03-24 DIAGNOSIS — R278 Other lack of coordination: Secondary | ICD-10-CM | POA: Diagnosis not present

## 2016-03-24 DIAGNOSIS — G9340 Encephalopathy, unspecified: Secondary | ICD-10-CM | POA: Diagnosis not present

## 2016-03-24 DIAGNOSIS — R41841 Cognitive communication deficit: Secondary | ICD-10-CM | POA: Diagnosis not present

## 2016-03-29 DIAGNOSIS — I639 Cerebral infarction, unspecified: Secondary | ICD-10-CM | POA: Diagnosis not present

## 2016-03-29 DIAGNOSIS — R278 Other lack of coordination: Secondary | ICD-10-CM | POA: Diagnosis not present

## 2016-03-29 DIAGNOSIS — G9340 Encephalopathy, unspecified: Secondary | ICD-10-CM | POA: Diagnosis not present

## 2016-03-29 DIAGNOSIS — R410 Disorientation, unspecified: Secondary | ICD-10-CM | POA: Diagnosis not present

## 2016-03-29 DIAGNOSIS — R41841 Cognitive communication deficit: Secondary | ICD-10-CM | POA: Diagnosis not present

## 2016-03-30 DIAGNOSIS — R278 Other lack of coordination: Secondary | ICD-10-CM | POA: Diagnosis not present

## 2016-03-30 DIAGNOSIS — G9340 Encephalopathy, unspecified: Secondary | ICD-10-CM | POA: Diagnosis not present

## 2016-03-30 DIAGNOSIS — R41841 Cognitive communication deficit: Secondary | ICD-10-CM | POA: Diagnosis not present

## 2016-03-30 DIAGNOSIS — R410 Disorientation, unspecified: Secondary | ICD-10-CM | POA: Diagnosis not present

## 2016-03-30 DIAGNOSIS — I639 Cerebral infarction, unspecified: Secondary | ICD-10-CM | POA: Diagnosis not present

## 2016-03-31 DIAGNOSIS — R410 Disorientation, unspecified: Secondary | ICD-10-CM | POA: Diagnosis not present

## 2016-03-31 DIAGNOSIS — G9340 Encephalopathy, unspecified: Secondary | ICD-10-CM | POA: Diagnosis not present

## 2016-03-31 DIAGNOSIS — I639 Cerebral infarction, unspecified: Secondary | ICD-10-CM | POA: Diagnosis not present

## 2016-03-31 DIAGNOSIS — R278 Other lack of coordination: Secondary | ICD-10-CM | POA: Diagnosis not present

## 2016-03-31 DIAGNOSIS — R41841 Cognitive communication deficit: Secondary | ICD-10-CM | POA: Diagnosis not present

## 2016-04-01 DIAGNOSIS — R2689 Other abnormalities of gait and mobility: Secondary | ICD-10-CM | POA: Diagnosis not present

## 2016-04-01 DIAGNOSIS — N400 Enlarged prostate without lower urinary tract symptoms: Secondary | ICD-10-CM | POA: Diagnosis not present

## 2016-04-01 DIAGNOSIS — R410 Disorientation, unspecified: Secondary | ICD-10-CM | POA: Diagnosis not present

## 2016-04-01 DIAGNOSIS — R41841 Cognitive communication deficit: Secondary | ICD-10-CM | POA: Diagnosis not present

## 2016-04-01 DIAGNOSIS — I639 Cerebral infarction, unspecified: Secondary | ICD-10-CM | POA: Diagnosis not present

## 2016-04-01 DIAGNOSIS — G9340 Encephalopathy, unspecified: Secondary | ICD-10-CM | POA: Diagnosis not present

## 2016-04-01 DIAGNOSIS — R278 Other lack of coordination: Secondary | ICD-10-CM | POA: Diagnosis not present

## 2016-04-01 DIAGNOSIS — F0390 Unspecified dementia without behavioral disturbance: Secondary | ICD-10-CM | POA: Diagnosis not present

## 2016-04-02 DIAGNOSIS — G9340 Encephalopathy, unspecified: Secondary | ICD-10-CM | POA: Diagnosis not present

## 2016-04-02 DIAGNOSIS — R278 Other lack of coordination: Secondary | ICD-10-CM | POA: Diagnosis not present

## 2016-04-02 DIAGNOSIS — R41841 Cognitive communication deficit: Secondary | ICD-10-CM | POA: Diagnosis not present

## 2016-04-02 DIAGNOSIS — R410 Disorientation, unspecified: Secondary | ICD-10-CM | POA: Diagnosis not present

## 2016-04-02 DIAGNOSIS — I639 Cerebral infarction, unspecified: Secondary | ICD-10-CM | POA: Diagnosis not present

## 2016-04-04 DIAGNOSIS — R410 Disorientation, unspecified: Secondary | ICD-10-CM | POA: Diagnosis not present

## 2016-04-04 DIAGNOSIS — R278 Other lack of coordination: Secondary | ICD-10-CM | POA: Diagnosis not present

## 2016-04-04 DIAGNOSIS — I639 Cerebral infarction, unspecified: Secondary | ICD-10-CM | POA: Diagnosis not present

## 2016-04-04 DIAGNOSIS — G9340 Encephalopathy, unspecified: Secondary | ICD-10-CM | POA: Diagnosis not present

## 2016-04-04 DIAGNOSIS — R41841 Cognitive communication deficit: Secondary | ICD-10-CM | POA: Diagnosis not present

## 2016-04-05 DIAGNOSIS — I639 Cerebral infarction, unspecified: Secondary | ICD-10-CM | POA: Diagnosis not present

## 2016-04-05 DIAGNOSIS — R278 Other lack of coordination: Secondary | ICD-10-CM | POA: Diagnosis not present

## 2016-04-05 DIAGNOSIS — R410 Disorientation, unspecified: Secondary | ICD-10-CM | POA: Diagnosis not present

## 2016-04-05 DIAGNOSIS — G9340 Encephalopathy, unspecified: Secondary | ICD-10-CM | POA: Diagnosis not present

## 2016-04-05 DIAGNOSIS — R41841 Cognitive communication deficit: Secondary | ICD-10-CM | POA: Diagnosis not present

## 2016-04-06 DIAGNOSIS — R41841 Cognitive communication deficit: Secondary | ICD-10-CM | POA: Diagnosis not present

## 2016-04-06 DIAGNOSIS — G9340 Encephalopathy, unspecified: Secondary | ICD-10-CM | POA: Diagnosis not present

## 2016-04-06 DIAGNOSIS — R410 Disorientation, unspecified: Secondary | ICD-10-CM | POA: Diagnosis not present

## 2016-04-06 DIAGNOSIS — I639 Cerebral infarction, unspecified: Secondary | ICD-10-CM | POA: Diagnosis not present

## 2016-04-06 DIAGNOSIS — R278 Other lack of coordination: Secondary | ICD-10-CM | POA: Diagnosis not present

## 2016-04-21 DIAGNOSIS — F329 Major depressive disorder, single episode, unspecified: Secondary | ICD-10-CM | POA: Diagnosis not present

## 2016-04-21 DIAGNOSIS — F419 Anxiety disorder, unspecified: Secondary | ICD-10-CM | POA: Diagnosis not present

## 2016-04-21 DIAGNOSIS — F0390 Unspecified dementia without behavioral disturbance: Secondary | ICD-10-CM | POA: Diagnosis not present

## 2016-04-29 DIAGNOSIS — F0391 Unspecified dementia with behavioral disturbance: Secondary | ICD-10-CM | POA: Diagnosis not present

## 2016-04-29 DIAGNOSIS — I639 Cerebral infarction, unspecified: Secondary | ICD-10-CM | POA: Diagnosis not present

## 2016-04-29 DIAGNOSIS — I10 Essential (primary) hypertension: Secondary | ICD-10-CM | POA: Diagnosis not present

## 2016-04-29 DIAGNOSIS — F322 Major depressive disorder, single episode, severe without psychotic features: Secondary | ICD-10-CM | POA: Diagnosis not present

## 2016-04-29 DIAGNOSIS — I739 Peripheral vascular disease, unspecified: Secondary | ICD-10-CM | POA: Diagnosis not present

## 2016-04-29 DIAGNOSIS — I2510 Atherosclerotic heart disease of native coronary artery without angina pectoris: Secondary | ICD-10-CM | POA: Diagnosis not present

## 2016-05-07 DIAGNOSIS — I10 Essential (primary) hypertension: Secondary | ICD-10-CM | POA: Diagnosis not present

## 2016-05-20 DIAGNOSIS — R279 Unspecified lack of coordination: Secondary | ICD-10-CM | POA: Diagnosis not present

## 2016-05-20 DIAGNOSIS — R2689 Other abnormalities of gait and mobility: Secondary | ICD-10-CM | POA: Diagnosis not present

## 2016-05-20 DIAGNOSIS — I10 Essential (primary) hypertension: Secondary | ICD-10-CM | POA: Diagnosis not present

## 2016-05-20 DIAGNOSIS — M6281 Muscle weakness (generalized): Secondary | ICD-10-CM | POA: Diagnosis not present

## 2016-05-20 DIAGNOSIS — I639 Cerebral infarction, unspecified: Secondary | ICD-10-CM | POA: Diagnosis not present

## 2016-05-21 DIAGNOSIS — I10 Essential (primary) hypertension: Secondary | ICD-10-CM | POA: Diagnosis not present

## 2016-05-21 DIAGNOSIS — R2689 Other abnormalities of gait and mobility: Secondary | ICD-10-CM | POA: Diagnosis not present

## 2016-05-21 DIAGNOSIS — I639 Cerebral infarction, unspecified: Secondary | ICD-10-CM | POA: Diagnosis not present

## 2016-05-21 DIAGNOSIS — M6281 Muscle weakness (generalized): Secondary | ICD-10-CM | POA: Diagnosis not present

## 2016-05-21 DIAGNOSIS — R279 Unspecified lack of coordination: Secondary | ICD-10-CM | POA: Diagnosis not present

## 2016-05-25 DIAGNOSIS — M6281 Muscle weakness (generalized): Secondary | ICD-10-CM | POA: Diagnosis not present

## 2016-05-25 DIAGNOSIS — I639 Cerebral infarction, unspecified: Secondary | ICD-10-CM | POA: Diagnosis not present

## 2016-05-25 DIAGNOSIS — R279 Unspecified lack of coordination: Secondary | ICD-10-CM | POA: Diagnosis not present

## 2016-05-25 DIAGNOSIS — I10 Essential (primary) hypertension: Secondary | ICD-10-CM | POA: Diagnosis not present

## 2016-05-25 DIAGNOSIS — R2689 Other abnormalities of gait and mobility: Secondary | ICD-10-CM | POA: Diagnosis not present

## 2016-05-26 DIAGNOSIS — R2689 Other abnormalities of gait and mobility: Secondary | ICD-10-CM | POA: Diagnosis not present

## 2016-05-26 DIAGNOSIS — I10 Essential (primary) hypertension: Secondary | ICD-10-CM | POA: Diagnosis not present

## 2016-05-26 DIAGNOSIS — I639 Cerebral infarction, unspecified: Secondary | ICD-10-CM | POA: Diagnosis not present

## 2016-05-26 DIAGNOSIS — R279 Unspecified lack of coordination: Secondary | ICD-10-CM | POA: Diagnosis not present

## 2016-05-26 DIAGNOSIS — M6281 Muscle weakness (generalized): Secondary | ICD-10-CM | POA: Diagnosis not present

## 2016-05-27 DIAGNOSIS — F0391 Unspecified dementia with behavioral disturbance: Secondary | ICD-10-CM | POA: Diagnosis not present

## 2016-05-27 DIAGNOSIS — N400 Enlarged prostate without lower urinary tract symptoms: Secondary | ICD-10-CM | POA: Diagnosis not present

## 2016-05-27 DIAGNOSIS — M6281 Muscle weakness (generalized): Secondary | ICD-10-CM | POA: Diagnosis not present

## 2016-05-27 DIAGNOSIS — I2510 Atherosclerotic heart disease of native coronary artery without angina pectoris: Secondary | ICD-10-CM | POA: Diagnosis not present

## 2016-05-27 DIAGNOSIS — I639 Cerebral infarction, unspecified: Secondary | ICD-10-CM | POA: Diagnosis not present

## 2016-05-27 DIAGNOSIS — R279 Unspecified lack of coordination: Secondary | ICD-10-CM | POA: Diagnosis not present

## 2016-05-27 DIAGNOSIS — F322 Major depressive disorder, single episode, severe without psychotic features: Secondary | ICD-10-CM | POA: Diagnosis not present

## 2016-05-27 DIAGNOSIS — R2689 Other abnormalities of gait and mobility: Secondary | ICD-10-CM | POA: Diagnosis not present

## 2016-05-27 DIAGNOSIS — I10 Essential (primary) hypertension: Secondary | ICD-10-CM | POA: Diagnosis not present

## 2016-05-28 DIAGNOSIS — I10 Essential (primary) hypertension: Secondary | ICD-10-CM | POA: Diagnosis not present

## 2016-05-28 DIAGNOSIS — M6281 Muscle weakness (generalized): Secondary | ICD-10-CM | POA: Diagnosis not present

## 2016-05-28 DIAGNOSIS — R279 Unspecified lack of coordination: Secondary | ICD-10-CM | POA: Diagnosis not present

## 2016-05-28 DIAGNOSIS — R2689 Other abnormalities of gait and mobility: Secondary | ICD-10-CM | POA: Diagnosis not present

## 2016-05-28 DIAGNOSIS — I639 Cerebral infarction, unspecified: Secondary | ICD-10-CM | POA: Diagnosis not present

## 2016-05-29 DIAGNOSIS — I639 Cerebral infarction, unspecified: Secondary | ICD-10-CM | POA: Diagnosis not present

## 2016-05-29 DIAGNOSIS — R279 Unspecified lack of coordination: Secondary | ICD-10-CM | POA: Diagnosis not present

## 2016-05-29 DIAGNOSIS — M6281 Muscle weakness (generalized): Secondary | ICD-10-CM | POA: Diagnosis not present

## 2016-05-29 DIAGNOSIS — I10 Essential (primary) hypertension: Secondary | ICD-10-CM | POA: Diagnosis not present

## 2016-05-29 DIAGNOSIS — R2689 Other abnormalities of gait and mobility: Secondary | ICD-10-CM | POA: Diagnosis not present

## 2016-05-31 DIAGNOSIS — M6281 Muscle weakness (generalized): Secondary | ICD-10-CM | POA: Diagnosis not present

## 2016-05-31 DIAGNOSIS — I639 Cerebral infarction, unspecified: Secondary | ICD-10-CM | POA: Diagnosis not present

## 2016-05-31 DIAGNOSIS — I10 Essential (primary) hypertension: Secondary | ICD-10-CM | POA: Diagnosis not present

## 2016-05-31 DIAGNOSIS — R2689 Other abnormalities of gait and mobility: Secondary | ICD-10-CM | POA: Diagnosis not present

## 2016-05-31 DIAGNOSIS — R279 Unspecified lack of coordination: Secondary | ICD-10-CM | POA: Diagnosis not present

## 2016-06-01 DIAGNOSIS — I639 Cerebral infarction, unspecified: Secondary | ICD-10-CM | POA: Diagnosis not present

## 2016-06-01 DIAGNOSIS — R279 Unspecified lack of coordination: Secondary | ICD-10-CM | POA: Diagnosis not present

## 2016-06-01 DIAGNOSIS — M6281 Muscle weakness (generalized): Secondary | ICD-10-CM | POA: Diagnosis not present

## 2016-06-01 DIAGNOSIS — I10 Essential (primary) hypertension: Secondary | ICD-10-CM | POA: Diagnosis not present

## 2016-06-01 DIAGNOSIS — R2689 Other abnormalities of gait and mobility: Secondary | ICD-10-CM | POA: Diagnosis not present

## 2016-06-03 DIAGNOSIS — M6281 Muscle weakness (generalized): Secondary | ICD-10-CM | POA: Diagnosis not present

## 2016-06-03 DIAGNOSIS — I10 Essential (primary) hypertension: Secondary | ICD-10-CM | POA: Diagnosis not present

## 2016-06-03 DIAGNOSIS — I639 Cerebral infarction, unspecified: Secondary | ICD-10-CM | POA: Diagnosis not present

## 2016-06-03 DIAGNOSIS — R2689 Other abnormalities of gait and mobility: Secondary | ICD-10-CM | POA: Diagnosis not present

## 2016-06-03 DIAGNOSIS — R279 Unspecified lack of coordination: Secondary | ICD-10-CM | POA: Diagnosis not present

## 2016-06-04 DIAGNOSIS — I639 Cerebral infarction, unspecified: Secondary | ICD-10-CM | POA: Diagnosis not present

## 2016-06-04 DIAGNOSIS — R2689 Other abnormalities of gait and mobility: Secondary | ICD-10-CM | POA: Diagnosis not present

## 2016-06-04 DIAGNOSIS — M6281 Muscle weakness (generalized): Secondary | ICD-10-CM | POA: Diagnosis not present

## 2016-06-04 DIAGNOSIS — R279 Unspecified lack of coordination: Secondary | ICD-10-CM | POA: Diagnosis not present

## 2016-06-04 DIAGNOSIS — I10 Essential (primary) hypertension: Secondary | ICD-10-CM | POA: Diagnosis not present

## 2016-06-05 DIAGNOSIS — R2689 Other abnormalities of gait and mobility: Secondary | ICD-10-CM | POA: Diagnosis not present

## 2016-06-05 DIAGNOSIS — I10 Essential (primary) hypertension: Secondary | ICD-10-CM | POA: Diagnosis not present

## 2016-06-05 DIAGNOSIS — I639 Cerebral infarction, unspecified: Secondary | ICD-10-CM | POA: Diagnosis not present

## 2016-06-05 DIAGNOSIS — R279 Unspecified lack of coordination: Secondary | ICD-10-CM | POA: Diagnosis not present

## 2016-06-05 DIAGNOSIS — M6281 Muscle weakness (generalized): Secondary | ICD-10-CM | POA: Diagnosis not present

## 2016-06-07 DIAGNOSIS — R279 Unspecified lack of coordination: Secondary | ICD-10-CM | POA: Diagnosis not present

## 2016-06-07 DIAGNOSIS — I639 Cerebral infarction, unspecified: Secondary | ICD-10-CM | POA: Diagnosis not present

## 2016-06-07 DIAGNOSIS — R2689 Other abnormalities of gait and mobility: Secondary | ICD-10-CM | POA: Diagnosis not present

## 2016-06-07 DIAGNOSIS — M6281 Muscle weakness (generalized): Secondary | ICD-10-CM | POA: Diagnosis not present

## 2016-06-07 DIAGNOSIS — I10 Essential (primary) hypertension: Secondary | ICD-10-CM | POA: Diagnosis not present

## 2016-06-08 DIAGNOSIS — I639 Cerebral infarction, unspecified: Secondary | ICD-10-CM | POA: Diagnosis not present

## 2016-06-08 DIAGNOSIS — R2689 Other abnormalities of gait and mobility: Secondary | ICD-10-CM | POA: Diagnosis not present

## 2016-06-08 DIAGNOSIS — R279 Unspecified lack of coordination: Secondary | ICD-10-CM | POA: Diagnosis not present

## 2016-06-08 DIAGNOSIS — M6281 Muscle weakness (generalized): Secondary | ICD-10-CM | POA: Diagnosis not present

## 2016-06-08 DIAGNOSIS — I10 Essential (primary) hypertension: Secondary | ICD-10-CM | POA: Diagnosis not present

## 2016-06-09 DIAGNOSIS — I639 Cerebral infarction, unspecified: Secondary | ICD-10-CM | POA: Diagnosis not present

## 2016-06-09 DIAGNOSIS — M6281 Muscle weakness (generalized): Secondary | ICD-10-CM | POA: Diagnosis not present

## 2016-06-09 DIAGNOSIS — R279 Unspecified lack of coordination: Secondary | ICD-10-CM | POA: Diagnosis not present

## 2016-06-09 DIAGNOSIS — I10 Essential (primary) hypertension: Secondary | ICD-10-CM | POA: Diagnosis not present

## 2016-06-09 DIAGNOSIS — R2689 Other abnormalities of gait and mobility: Secondary | ICD-10-CM | POA: Diagnosis not present

## 2016-06-10 DIAGNOSIS — R279 Unspecified lack of coordination: Secondary | ICD-10-CM | POA: Diagnosis not present

## 2016-06-10 DIAGNOSIS — M6281 Muscle weakness (generalized): Secondary | ICD-10-CM | POA: Diagnosis not present

## 2016-06-10 DIAGNOSIS — R2689 Other abnormalities of gait and mobility: Secondary | ICD-10-CM | POA: Diagnosis not present

## 2016-06-10 DIAGNOSIS — I639 Cerebral infarction, unspecified: Secondary | ICD-10-CM | POA: Diagnosis not present

## 2016-06-10 DIAGNOSIS — I10 Essential (primary) hypertension: Secondary | ICD-10-CM | POA: Diagnosis not present

## 2016-06-11 DIAGNOSIS — I639 Cerebral infarction, unspecified: Secondary | ICD-10-CM | POA: Diagnosis not present

## 2016-06-11 DIAGNOSIS — I10 Essential (primary) hypertension: Secondary | ICD-10-CM | POA: Diagnosis not present

## 2016-06-11 DIAGNOSIS — R279 Unspecified lack of coordination: Secondary | ICD-10-CM | POA: Diagnosis not present

## 2016-06-11 DIAGNOSIS — R2689 Other abnormalities of gait and mobility: Secondary | ICD-10-CM | POA: Diagnosis not present

## 2016-06-11 DIAGNOSIS — M6281 Muscle weakness (generalized): Secondary | ICD-10-CM | POA: Diagnosis not present

## 2016-06-12 DIAGNOSIS — I639 Cerebral infarction, unspecified: Secondary | ICD-10-CM | POA: Diagnosis not present

## 2016-06-12 DIAGNOSIS — M6281 Muscle weakness (generalized): Secondary | ICD-10-CM | POA: Diagnosis not present

## 2016-06-12 DIAGNOSIS — R279 Unspecified lack of coordination: Secondary | ICD-10-CM | POA: Diagnosis not present

## 2016-06-12 DIAGNOSIS — R2689 Other abnormalities of gait and mobility: Secondary | ICD-10-CM | POA: Diagnosis not present

## 2016-06-12 DIAGNOSIS — I10 Essential (primary) hypertension: Secondary | ICD-10-CM | POA: Diagnosis not present

## 2016-06-14 DIAGNOSIS — I639 Cerebral infarction, unspecified: Secondary | ICD-10-CM | POA: Diagnosis not present

## 2016-06-14 DIAGNOSIS — R279 Unspecified lack of coordination: Secondary | ICD-10-CM | POA: Diagnosis not present

## 2016-06-14 DIAGNOSIS — I10 Essential (primary) hypertension: Secondary | ICD-10-CM | POA: Diagnosis not present

## 2016-06-14 DIAGNOSIS — R2689 Other abnormalities of gait and mobility: Secondary | ICD-10-CM | POA: Diagnosis not present

## 2016-06-14 DIAGNOSIS — M6281 Muscle weakness (generalized): Secondary | ICD-10-CM | POA: Diagnosis not present

## 2016-06-15 DIAGNOSIS — I639 Cerebral infarction, unspecified: Secondary | ICD-10-CM | POA: Diagnosis not present

## 2016-06-15 DIAGNOSIS — M6281 Muscle weakness (generalized): Secondary | ICD-10-CM | POA: Diagnosis not present

## 2016-06-15 DIAGNOSIS — R279 Unspecified lack of coordination: Secondary | ICD-10-CM | POA: Diagnosis not present

## 2016-06-15 DIAGNOSIS — R2689 Other abnormalities of gait and mobility: Secondary | ICD-10-CM | POA: Diagnosis not present

## 2016-06-15 DIAGNOSIS — I10 Essential (primary) hypertension: Secondary | ICD-10-CM | POA: Diagnosis not present

## 2016-06-16 DIAGNOSIS — I10 Essential (primary) hypertension: Secondary | ICD-10-CM | POA: Diagnosis not present

## 2016-06-16 DIAGNOSIS — I639 Cerebral infarction, unspecified: Secondary | ICD-10-CM | POA: Diagnosis not present

## 2016-06-16 DIAGNOSIS — R2689 Other abnormalities of gait and mobility: Secondary | ICD-10-CM | POA: Diagnosis not present

## 2016-06-16 DIAGNOSIS — M6281 Muscle weakness (generalized): Secondary | ICD-10-CM | POA: Diagnosis not present

## 2016-06-16 DIAGNOSIS — R279 Unspecified lack of coordination: Secondary | ICD-10-CM | POA: Diagnosis not present

## 2016-06-18 DIAGNOSIS — B351 Tinea unguium: Secondary | ICD-10-CM | POA: Diagnosis not present

## 2016-06-18 DIAGNOSIS — G608 Other hereditary and idiopathic neuropathies: Secondary | ICD-10-CM | POA: Diagnosis not present

## 2016-06-18 DIAGNOSIS — I70203 Unspecified atherosclerosis of native arteries of extremities, bilateral legs: Secondary | ICD-10-CM | POA: Diagnosis not present

## 2016-06-18 DIAGNOSIS — M79674 Pain in right toe(s): Secondary | ICD-10-CM | POA: Diagnosis not present

## 2016-06-20 DIAGNOSIS — M6281 Muscle weakness (generalized): Secondary | ICD-10-CM | POA: Diagnosis not present

## 2016-06-20 DIAGNOSIS — I639 Cerebral infarction, unspecified: Secondary | ICD-10-CM | POA: Diagnosis not present

## 2016-06-20 DIAGNOSIS — R279 Unspecified lack of coordination: Secondary | ICD-10-CM | POA: Diagnosis not present

## 2016-06-20 DIAGNOSIS — R2689 Other abnormalities of gait and mobility: Secondary | ICD-10-CM | POA: Diagnosis not present

## 2016-06-20 DIAGNOSIS — I10 Essential (primary) hypertension: Secondary | ICD-10-CM | POA: Diagnosis not present

## 2016-06-21 DIAGNOSIS — I10 Essential (primary) hypertension: Secondary | ICD-10-CM | POA: Diagnosis not present

## 2016-06-21 DIAGNOSIS — I639 Cerebral infarction, unspecified: Secondary | ICD-10-CM | POA: Diagnosis not present

## 2016-06-21 DIAGNOSIS — R2689 Other abnormalities of gait and mobility: Secondary | ICD-10-CM | POA: Diagnosis not present

## 2016-06-21 DIAGNOSIS — M6281 Muscle weakness (generalized): Secondary | ICD-10-CM | POA: Diagnosis not present

## 2016-06-21 DIAGNOSIS — R279 Unspecified lack of coordination: Secondary | ICD-10-CM | POA: Diagnosis not present

## 2016-06-22 DIAGNOSIS — I10 Essential (primary) hypertension: Secondary | ICD-10-CM | POA: Diagnosis not present

## 2016-06-22 DIAGNOSIS — R2689 Other abnormalities of gait and mobility: Secondary | ICD-10-CM | POA: Diagnosis not present

## 2016-06-22 DIAGNOSIS — I639 Cerebral infarction, unspecified: Secondary | ICD-10-CM | POA: Diagnosis not present

## 2016-06-22 DIAGNOSIS — M6281 Muscle weakness (generalized): Secondary | ICD-10-CM | POA: Diagnosis not present

## 2016-06-22 DIAGNOSIS — R279 Unspecified lack of coordination: Secondary | ICD-10-CM | POA: Diagnosis not present

## 2016-06-23 DIAGNOSIS — R279 Unspecified lack of coordination: Secondary | ICD-10-CM | POA: Diagnosis not present

## 2016-06-23 DIAGNOSIS — R2689 Other abnormalities of gait and mobility: Secondary | ICD-10-CM | POA: Diagnosis not present

## 2016-06-23 DIAGNOSIS — M6281 Muscle weakness (generalized): Secondary | ICD-10-CM | POA: Diagnosis not present

## 2016-06-23 DIAGNOSIS — I10 Essential (primary) hypertension: Secondary | ICD-10-CM | POA: Diagnosis not present

## 2016-06-23 DIAGNOSIS — I639 Cerebral infarction, unspecified: Secondary | ICD-10-CM | POA: Diagnosis not present

## 2016-06-24 DIAGNOSIS — I10 Essential (primary) hypertension: Secondary | ICD-10-CM | POA: Diagnosis not present

## 2016-06-24 DIAGNOSIS — N400 Enlarged prostate without lower urinary tract symptoms: Secondary | ICD-10-CM | POA: Diagnosis not present

## 2016-06-24 DIAGNOSIS — I639 Cerebral infarction, unspecified: Secondary | ICD-10-CM | POA: Diagnosis not present

## 2016-06-24 DIAGNOSIS — R2689 Other abnormalities of gait and mobility: Secondary | ICD-10-CM | POA: Diagnosis not present

## 2016-06-24 DIAGNOSIS — F0391 Unspecified dementia with behavioral disturbance: Secondary | ICD-10-CM | POA: Diagnosis not present

## 2016-06-24 DIAGNOSIS — R279 Unspecified lack of coordination: Secondary | ICD-10-CM | POA: Diagnosis not present

## 2016-06-24 DIAGNOSIS — M6281 Muscle weakness (generalized): Secondary | ICD-10-CM | POA: Diagnosis not present

## 2016-06-24 DIAGNOSIS — I2510 Atherosclerotic heart disease of native coronary artery without angina pectoris: Secondary | ICD-10-CM | POA: Diagnosis not present

## 2016-06-25 DIAGNOSIS — R279 Unspecified lack of coordination: Secondary | ICD-10-CM | POA: Diagnosis not present

## 2016-06-25 DIAGNOSIS — M6281 Muscle weakness (generalized): Secondary | ICD-10-CM | POA: Diagnosis not present

## 2016-06-25 DIAGNOSIS — R2689 Other abnormalities of gait and mobility: Secondary | ICD-10-CM | POA: Diagnosis not present

## 2016-06-25 DIAGNOSIS — I10 Essential (primary) hypertension: Secondary | ICD-10-CM | POA: Diagnosis not present

## 2016-06-25 DIAGNOSIS — I639 Cerebral infarction, unspecified: Secondary | ICD-10-CM | POA: Diagnosis not present

## 2016-06-28 DIAGNOSIS — R279 Unspecified lack of coordination: Secondary | ICD-10-CM | POA: Diagnosis not present

## 2016-06-28 DIAGNOSIS — I639 Cerebral infarction, unspecified: Secondary | ICD-10-CM | POA: Diagnosis not present

## 2016-06-28 DIAGNOSIS — M6281 Muscle weakness (generalized): Secondary | ICD-10-CM | POA: Diagnosis not present

## 2016-06-28 DIAGNOSIS — I10 Essential (primary) hypertension: Secondary | ICD-10-CM | POA: Diagnosis not present

## 2016-06-28 DIAGNOSIS — R2689 Other abnormalities of gait and mobility: Secondary | ICD-10-CM | POA: Diagnosis not present

## 2016-06-29 DIAGNOSIS — R2689 Other abnormalities of gait and mobility: Secondary | ICD-10-CM | POA: Diagnosis not present

## 2016-06-29 DIAGNOSIS — I639 Cerebral infarction, unspecified: Secondary | ICD-10-CM | POA: Diagnosis not present

## 2016-06-29 DIAGNOSIS — I10 Essential (primary) hypertension: Secondary | ICD-10-CM | POA: Diagnosis not present

## 2016-06-29 DIAGNOSIS — R279 Unspecified lack of coordination: Secondary | ICD-10-CM | POA: Diagnosis not present

## 2016-06-29 DIAGNOSIS — M6281 Muscle weakness (generalized): Secondary | ICD-10-CM | POA: Diagnosis not present

## 2016-06-30 DIAGNOSIS — R279 Unspecified lack of coordination: Secondary | ICD-10-CM | POA: Diagnosis not present

## 2016-06-30 DIAGNOSIS — R2689 Other abnormalities of gait and mobility: Secondary | ICD-10-CM | POA: Diagnosis not present

## 2016-06-30 DIAGNOSIS — I10 Essential (primary) hypertension: Secondary | ICD-10-CM | POA: Diagnosis not present

## 2016-06-30 DIAGNOSIS — I639 Cerebral infarction, unspecified: Secondary | ICD-10-CM | POA: Diagnosis not present

## 2016-06-30 DIAGNOSIS — M6281 Muscle weakness (generalized): Secondary | ICD-10-CM | POA: Diagnosis not present

## 2016-07-01 DIAGNOSIS — F419 Anxiety disorder, unspecified: Secondary | ICD-10-CM | POA: Diagnosis not present

## 2016-07-01 DIAGNOSIS — R2689 Other abnormalities of gait and mobility: Secondary | ICD-10-CM | POA: Diagnosis not present

## 2016-07-01 DIAGNOSIS — F0390 Unspecified dementia without behavioral disturbance: Secondary | ICD-10-CM | POA: Diagnosis not present

## 2016-07-01 DIAGNOSIS — I639 Cerebral infarction, unspecified: Secondary | ICD-10-CM | POA: Diagnosis not present

## 2016-07-01 DIAGNOSIS — R279 Unspecified lack of coordination: Secondary | ICD-10-CM | POA: Diagnosis not present

## 2016-07-01 DIAGNOSIS — F329 Major depressive disorder, single episode, unspecified: Secondary | ICD-10-CM | POA: Diagnosis not present

## 2016-07-01 DIAGNOSIS — M6281 Muscle weakness (generalized): Secondary | ICD-10-CM | POA: Diagnosis not present

## 2016-07-01 DIAGNOSIS — I10 Essential (primary) hypertension: Secondary | ICD-10-CM | POA: Diagnosis not present

## 2016-07-02 DIAGNOSIS — I639 Cerebral infarction, unspecified: Secondary | ICD-10-CM | POA: Diagnosis not present

## 2016-07-02 DIAGNOSIS — R2689 Other abnormalities of gait and mobility: Secondary | ICD-10-CM | POA: Diagnosis not present

## 2016-07-02 DIAGNOSIS — M6281 Muscle weakness (generalized): Secondary | ICD-10-CM | POA: Diagnosis not present

## 2016-07-02 DIAGNOSIS — I10 Essential (primary) hypertension: Secondary | ICD-10-CM | POA: Diagnosis not present

## 2016-07-02 DIAGNOSIS — R279 Unspecified lack of coordination: Secondary | ICD-10-CM | POA: Diagnosis not present

## 2016-07-05 DIAGNOSIS — I639 Cerebral infarction, unspecified: Secondary | ICD-10-CM | POA: Diagnosis not present

## 2016-07-05 DIAGNOSIS — R279 Unspecified lack of coordination: Secondary | ICD-10-CM | POA: Diagnosis not present

## 2016-07-05 DIAGNOSIS — M6281 Muscle weakness (generalized): Secondary | ICD-10-CM | POA: Diagnosis not present

## 2016-07-05 DIAGNOSIS — R2689 Other abnormalities of gait and mobility: Secondary | ICD-10-CM | POA: Diagnosis not present

## 2016-07-05 DIAGNOSIS — I10 Essential (primary) hypertension: Secondary | ICD-10-CM | POA: Diagnosis not present

## 2016-07-06 DIAGNOSIS — M6281 Muscle weakness (generalized): Secondary | ICD-10-CM | POA: Diagnosis not present

## 2016-07-06 DIAGNOSIS — I639 Cerebral infarction, unspecified: Secondary | ICD-10-CM | POA: Diagnosis not present

## 2016-07-06 DIAGNOSIS — R2689 Other abnormalities of gait and mobility: Secondary | ICD-10-CM | POA: Diagnosis not present

## 2016-07-06 DIAGNOSIS — I10 Essential (primary) hypertension: Secondary | ICD-10-CM | POA: Diagnosis not present

## 2016-07-06 DIAGNOSIS — R279 Unspecified lack of coordination: Secondary | ICD-10-CM | POA: Diagnosis not present

## 2016-07-07 DIAGNOSIS — M6281 Muscle weakness (generalized): Secondary | ICD-10-CM | POA: Diagnosis not present

## 2016-07-07 DIAGNOSIS — I639 Cerebral infarction, unspecified: Secondary | ICD-10-CM | POA: Diagnosis not present

## 2016-07-07 DIAGNOSIS — R2689 Other abnormalities of gait and mobility: Secondary | ICD-10-CM | POA: Diagnosis not present

## 2016-07-07 DIAGNOSIS — I10 Essential (primary) hypertension: Secondary | ICD-10-CM | POA: Diagnosis not present

## 2016-07-07 DIAGNOSIS — R279 Unspecified lack of coordination: Secondary | ICD-10-CM | POA: Diagnosis not present

## 2016-07-08 DIAGNOSIS — M6281 Muscle weakness (generalized): Secondary | ICD-10-CM | POA: Diagnosis not present

## 2016-07-08 DIAGNOSIS — I639 Cerebral infarction, unspecified: Secondary | ICD-10-CM | POA: Diagnosis not present

## 2016-07-08 DIAGNOSIS — I10 Essential (primary) hypertension: Secondary | ICD-10-CM | POA: Diagnosis not present

## 2016-07-08 DIAGNOSIS — R2689 Other abnormalities of gait and mobility: Secondary | ICD-10-CM | POA: Diagnosis not present

## 2016-07-08 DIAGNOSIS — R279 Unspecified lack of coordination: Secondary | ICD-10-CM | POA: Diagnosis not present

## 2016-07-13 DIAGNOSIS — I10 Essential (primary) hypertension: Secondary | ICD-10-CM | POA: Diagnosis not present

## 2016-07-13 DIAGNOSIS — R279 Unspecified lack of coordination: Secondary | ICD-10-CM | POA: Diagnosis not present

## 2016-07-13 DIAGNOSIS — R2689 Other abnormalities of gait and mobility: Secondary | ICD-10-CM | POA: Diagnosis not present

## 2016-07-13 DIAGNOSIS — M6281 Muscle weakness (generalized): Secondary | ICD-10-CM | POA: Diagnosis not present

## 2016-07-13 DIAGNOSIS — I639 Cerebral infarction, unspecified: Secondary | ICD-10-CM | POA: Diagnosis not present

## 2016-07-14 DIAGNOSIS — R279 Unspecified lack of coordination: Secondary | ICD-10-CM | POA: Diagnosis not present

## 2016-07-14 DIAGNOSIS — I10 Essential (primary) hypertension: Secondary | ICD-10-CM | POA: Diagnosis not present

## 2016-07-14 DIAGNOSIS — R2689 Other abnormalities of gait and mobility: Secondary | ICD-10-CM | POA: Diagnosis not present

## 2016-07-14 DIAGNOSIS — I639 Cerebral infarction, unspecified: Secondary | ICD-10-CM | POA: Diagnosis not present

## 2016-07-14 DIAGNOSIS — M6281 Muscle weakness (generalized): Secondary | ICD-10-CM | POA: Diagnosis not present

## 2016-07-15 DIAGNOSIS — I639 Cerebral infarction, unspecified: Secondary | ICD-10-CM | POA: Diagnosis not present

## 2016-07-15 DIAGNOSIS — R2689 Other abnormalities of gait and mobility: Secondary | ICD-10-CM | POA: Diagnosis not present

## 2016-07-15 DIAGNOSIS — R279 Unspecified lack of coordination: Secondary | ICD-10-CM | POA: Diagnosis not present

## 2016-07-15 DIAGNOSIS — R41841 Cognitive communication deficit: Secondary | ICD-10-CM | POA: Diagnosis not present

## 2016-07-15 DIAGNOSIS — M6281 Muscle weakness (generalized): Secondary | ICD-10-CM | POA: Diagnosis not present

## 2016-07-16 DIAGNOSIS — R279 Unspecified lack of coordination: Secondary | ICD-10-CM | POA: Diagnosis not present

## 2016-07-16 DIAGNOSIS — I639 Cerebral infarction, unspecified: Secondary | ICD-10-CM | POA: Diagnosis not present

## 2016-07-16 DIAGNOSIS — M6281 Muscle weakness (generalized): Secondary | ICD-10-CM | POA: Diagnosis not present

## 2016-07-16 DIAGNOSIS — R41841 Cognitive communication deficit: Secondary | ICD-10-CM | POA: Diagnosis not present

## 2016-07-16 DIAGNOSIS — R2689 Other abnormalities of gait and mobility: Secondary | ICD-10-CM | POA: Diagnosis not present

## 2016-07-19 DIAGNOSIS — R279 Unspecified lack of coordination: Secondary | ICD-10-CM | POA: Diagnosis not present

## 2016-07-19 DIAGNOSIS — R41841 Cognitive communication deficit: Secondary | ICD-10-CM | POA: Diagnosis not present

## 2016-07-19 DIAGNOSIS — I639 Cerebral infarction, unspecified: Secondary | ICD-10-CM | POA: Diagnosis not present

## 2016-07-19 DIAGNOSIS — R2689 Other abnormalities of gait and mobility: Secondary | ICD-10-CM | POA: Diagnosis not present

## 2016-07-19 DIAGNOSIS — M6281 Muscle weakness (generalized): Secondary | ICD-10-CM | POA: Diagnosis not present

## 2016-07-20 DIAGNOSIS — R2689 Other abnormalities of gait and mobility: Secondary | ICD-10-CM | POA: Diagnosis not present

## 2016-07-20 DIAGNOSIS — R41841 Cognitive communication deficit: Secondary | ICD-10-CM | POA: Diagnosis not present

## 2016-07-20 DIAGNOSIS — I639 Cerebral infarction, unspecified: Secondary | ICD-10-CM | POA: Diagnosis not present

## 2016-07-20 DIAGNOSIS — R279 Unspecified lack of coordination: Secondary | ICD-10-CM | POA: Diagnosis not present

## 2016-07-20 DIAGNOSIS — M6281 Muscle weakness (generalized): Secondary | ICD-10-CM | POA: Diagnosis not present

## 2016-07-21 DIAGNOSIS — R41841 Cognitive communication deficit: Secondary | ICD-10-CM | POA: Diagnosis not present

## 2016-07-21 DIAGNOSIS — R279 Unspecified lack of coordination: Secondary | ICD-10-CM | POA: Diagnosis not present

## 2016-07-21 DIAGNOSIS — I639 Cerebral infarction, unspecified: Secondary | ICD-10-CM | POA: Diagnosis not present

## 2016-07-21 DIAGNOSIS — M6281 Muscle weakness (generalized): Secondary | ICD-10-CM | POA: Diagnosis not present

## 2016-07-21 DIAGNOSIS — R2689 Other abnormalities of gait and mobility: Secondary | ICD-10-CM | POA: Diagnosis not present

## 2016-07-22 DIAGNOSIS — I10 Essential (primary) hypertension: Secondary | ICD-10-CM | POA: Diagnosis not present

## 2016-07-22 DIAGNOSIS — R2689 Other abnormalities of gait and mobility: Secondary | ICD-10-CM | POA: Diagnosis not present

## 2016-07-22 DIAGNOSIS — I2510 Atherosclerotic heart disease of native coronary artery without angina pectoris: Secondary | ICD-10-CM | POA: Diagnosis not present

## 2016-07-22 DIAGNOSIS — F0391 Unspecified dementia with behavioral disturbance: Secondary | ICD-10-CM | POA: Diagnosis not present

## 2016-07-22 DIAGNOSIS — M6281 Muscle weakness (generalized): Secondary | ICD-10-CM | POA: Diagnosis not present

## 2016-07-22 DIAGNOSIS — I639 Cerebral infarction, unspecified: Secondary | ICD-10-CM | POA: Diagnosis not present

## 2016-07-22 DIAGNOSIS — R41841 Cognitive communication deficit: Secondary | ICD-10-CM | POA: Diagnosis not present

## 2016-07-22 DIAGNOSIS — R279 Unspecified lack of coordination: Secondary | ICD-10-CM | POA: Diagnosis not present

## 2016-07-22 DIAGNOSIS — N400 Enlarged prostate without lower urinary tract symptoms: Secondary | ICD-10-CM | POA: Diagnosis not present

## 2016-07-23 DIAGNOSIS — R2689 Other abnormalities of gait and mobility: Secondary | ICD-10-CM | POA: Diagnosis not present

## 2016-07-23 DIAGNOSIS — M6281 Muscle weakness (generalized): Secondary | ICD-10-CM | POA: Diagnosis not present

## 2016-07-23 DIAGNOSIS — I639 Cerebral infarction, unspecified: Secondary | ICD-10-CM | POA: Diagnosis not present

## 2016-07-23 DIAGNOSIS — R279 Unspecified lack of coordination: Secondary | ICD-10-CM | POA: Diagnosis not present

## 2016-07-23 DIAGNOSIS — R41841 Cognitive communication deficit: Secondary | ICD-10-CM | POA: Diagnosis not present

## 2016-07-26 DIAGNOSIS — I639 Cerebral infarction, unspecified: Secondary | ICD-10-CM | POA: Diagnosis not present

## 2016-07-26 DIAGNOSIS — M6281 Muscle weakness (generalized): Secondary | ICD-10-CM | POA: Diagnosis not present

## 2016-07-26 DIAGNOSIS — R279 Unspecified lack of coordination: Secondary | ICD-10-CM | POA: Diagnosis not present

## 2016-07-26 DIAGNOSIS — R41841 Cognitive communication deficit: Secondary | ICD-10-CM | POA: Diagnosis not present

## 2016-07-26 DIAGNOSIS — R2689 Other abnormalities of gait and mobility: Secondary | ICD-10-CM | POA: Diagnosis not present

## 2016-07-27 DIAGNOSIS — R279 Unspecified lack of coordination: Secondary | ICD-10-CM | POA: Diagnosis not present

## 2016-07-27 DIAGNOSIS — R2689 Other abnormalities of gait and mobility: Secondary | ICD-10-CM | POA: Diagnosis not present

## 2016-07-27 DIAGNOSIS — R41841 Cognitive communication deficit: Secondary | ICD-10-CM | POA: Diagnosis not present

## 2016-07-27 DIAGNOSIS — I639 Cerebral infarction, unspecified: Secondary | ICD-10-CM | POA: Diagnosis not present

## 2016-07-27 DIAGNOSIS — M6281 Muscle weakness (generalized): Secondary | ICD-10-CM | POA: Diagnosis not present

## 2016-07-28 DIAGNOSIS — I639 Cerebral infarction, unspecified: Secondary | ICD-10-CM | POA: Diagnosis not present

## 2016-07-28 DIAGNOSIS — R279 Unspecified lack of coordination: Secondary | ICD-10-CM | POA: Diagnosis not present

## 2016-07-28 DIAGNOSIS — R41841 Cognitive communication deficit: Secondary | ICD-10-CM | POA: Diagnosis not present

## 2016-07-28 DIAGNOSIS — M6281 Muscle weakness (generalized): Secondary | ICD-10-CM | POA: Diagnosis not present

## 2016-07-28 DIAGNOSIS — R2689 Other abnormalities of gait and mobility: Secondary | ICD-10-CM | POA: Diagnosis not present

## 2016-07-29 DIAGNOSIS — R2689 Other abnormalities of gait and mobility: Secondary | ICD-10-CM | POA: Diagnosis not present

## 2016-07-29 DIAGNOSIS — M6281 Muscle weakness (generalized): Secondary | ICD-10-CM | POA: Diagnosis not present

## 2016-07-29 DIAGNOSIS — R279 Unspecified lack of coordination: Secondary | ICD-10-CM | POA: Diagnosis not present

## 2016-07-29 DIAGNOSIS — R41841 Cognitive communication deficit: Secondary | ICD-10-CM | POA: Diagnosis not present

## 2016-07-29 DIAGNOSIS — I639 Cerebral infarction, unspecified: Secondary | ICD-10-CM | POA: Diagnosis not present

## 2016-07-30 DIAGNOSIS — R2689 Other abnormalities of gait and mobility: Secondary | ICD-10-CM | POA: Diagnosis not present

## 2016-07-30 DIAGNOSIS — R41841 Cognitive communication deficit: Secondary | ICD-10-CM | POA: Diagnosis not present

## 2016-07-30 DIAGNOSIS — I639 Cerebral infarction, unspecified: Secondary | ICD-10-CM | POA: Diagnosis not present

## 2016-07-30 DIAGNOSIS — M6281 Muscle weakness (generalized): Secondary | ICD-10-CM | POA: Diagnosis not present

## 2016-07-30 DIAGNOSIS — R279 Unspecified lack of coordination: Secondary | ICD-10-CM | POA: Diagnosis not present

## 2016-08-02 DIAGNOSIS — R279 Unspecified lack of coordination: Secondary | ICD-10-CM | POA: Diagnosis not present

## 2016-08-02 DIAGNOSIS — R41841 Cognitive communication deficit: Secondary | ICD-10-CM | POA: Diagnosis not present

## 2016-08-02 DIAGNOSIS — I639 Cerebral infarction, unspecified: Secondary | ICD-10-CM | POA: Diagnosis not present

## 2016-08-02 DIAGNOSIS — R2689 Other abnormalities of gait and mobility: Secondary | ICD-10-CM | POA: Diagnosis not present

## 2016-08-02 DIAGNOSIS — M6281 Muscle weakness (generalized): Secondary | ICD-10-CM | POA: Diagnosis not present

## 2016-08-03 DIAGNOSIS — R279 Unspecified lack of coordination: Secondary | ICD-10-CM | POA: Diagnosis not present

## 2016-08-03 DIAGNOSIS — I639 Cerebral infarction, unspecified: Secondary | ICD-10-CM | POA: Diagnosis not present

## 2016-08-03 DIAGNOSIS — R2689 Other abnormalities of gait and mobility: Secondary | ICD-10-CM | POA: Diagnosis not present

## 2016-08-03 DIAGNOSIS — M6281 Muscle weakness (generalized): Secondary | ICD-10-CM | POA: Diagnosis not present

## 2016-08-03 DIAGNOSIS — R41841 Cognitive communication deficit: Secondary | ICD-10-CM | POA: Diagnosis not present

## 2016-08-04 DIAGNOSIS — R279 Unspecified lack of coordination: Secondary | ICD-10-CM | POA: Diagnosis not present

## 2016-08-04 DIAGNOSIS — R41841 Cognitive communication deficit: Secondary | ICD-10-CM | POA: Diagnosis not present

## 2016-08-04 DIAGNOSIS — I639 Cerebral infarction, unspecified: Secondary | ICD-10-CM | POA: Diagnosis not present

## 2016-08-04 DIAGNOSIS — M6281 Muscle weakness (generalized): Secondary | ICD-10-CM | POA: Diagnosis not present

## 2016-08-04 DIAGNOSIS — R2689 Other abnormalities of gait and mobility: Secondary | ICD-10-CM | POA: Diagnosis not present

## 2016-08-05 DIAGNOSIS — M6281 Muscle weakness (generalized): Secondary | ICD-10-CM | POA: Diagnosis not present

## 2016-08-05 DIAGNOSIS — R279 Unspecified lack of coordination: Secondary | ICD-10-CM | POA: Diagnosis not present

## 2016-08-05 DIAGNOSIS — R2689 Other abnormalities of gait and mobility: Secondary | ICD-10-CM | POA: Diagnosis not present

## 2016-08-05 DIAGNOSIS — R41841 Cognitive communication deficit: Secondary | ICD-10-CM | POA: Diagnosis not present

## 2016-08-05 DIAGNOSIS — I639 Cerebral infarction, unspecified: Secondary | ICD-10-CM | POA: Diagnosis not present

## 2016-08-06 DIAGNOSIS — I639 Cerebral infarction, unspecified: Secondary | ICD-10-CM | POA: Diagnosis not present

## 2016-08-06 DIAGNOSIS — R2689 Other abnormalities of gait and mobility: Secondary | ICD-10-CM | POA: Diagnosis not present

## 2016-08-06 DIAGNOSIS — R279 Unspecified lack of coordination: Secondary | ICD-10-CM | POA: Diagnosis not present

## 2016-08-06 DIAGNOSIS — R41841 Cognitive communication deficit: Secondary | ICD-10-CM | POA: Diagnosis not present

## 2016-08-06 DIAGNOSIS — M6281 Muscle weakness (generalized): Secondary | ICD-10-CM | POA: Diagnosis not present

## 2016-08-09 DIAGNOSIS — R41841 Cognitive communication deficit: Secondary | ICD-10-CM | POA: Diagnosis not present

## 2016-08-09 DIAGNOSIS — R2689 Other abnormalities of gait and mobility: Secondary | ICD-10-CM | POA: Diagnosis not present

## 2016-08-09 DIAGNOSIS — I639 Cerebral infarction, unspecified: Secondary | ICD-10-CM | POA: Diagnosis not present

## 2016-08-09 DIAGNOSIS — M6281 Muscle weakness (generalized): Secondary | ICD-10-CM | POA: Diagnosis not present

## 2016-08-09 DIAGNOSIS — R279 Unspecified lack of coordination: Secondary | ICD-10-CM | POA: Diagnosis not present

## 2016-08-10 DIAGNOSIS — R41841 Cognitive communication deficit: Secondary | ICD-10-CM | POA: Diagnosis not present

## 2016-08-10 DIAGNOSIS — R2689 Other abnormalities of gait and mobility: Secondary | ICD-10-CM | POA: Diagnosis not present

## 2016-08-10 DIAGNOSIS — R279 Unspecified lack of coordination: Secondary | ICD-10-CM | POA: Diagnosis not present

## 2016-08-10 DIAGNOSIS — M6281 Muscle weakness (generalized): Secondary | ICD-10-CM | POA: Diagnosis not present

## 2016-08-10 DIAGNOSIS — I639 Cerebral infarction, unspecified: Secondary | ICD-10-CM | POA: Diagnosis not present

## 2016-08-11 DIAGNOSIS — R279 Unspecified lack of coordination: Secondary | ICD-10-CM | POA: Diagnosis not present

## 2016-08-11 DIAGNOSIS — R2689 Other abnormalities of gait and mobility: Secondary | ICD-10-CM | POA: Diagnosis not present

## 2016-08-11 DIAGNOSIS — R41841 Cognitive communication deficit: Secondary | ICD-10-CM | POA: Diagnosis not present

## 2016-08-11 DIAGNOSIS — M6281 Muscle weakness (generalized): Secondary | ICD-10-CM | POA: Diagnosis not present

## 2016-08-11 DIAGNOSIS — I639 Cerebral infarction, unspecified: Secondary | ICD-10-CM | POA: Diagnosis not present

## 2016-08-12 DIAGNOSIS — R279 Unspecified lack of coordination: Secondary | ICD-10-CM | POA: Diagnosis not present

## 2016-08-12 DIAGNOSIS — R2689 Other abnormalities of gait and mobility: Secondary | ICD-10-CM | POA: Diagnosis not present

## 2016-08-12 DIAGNOSIS — M6281 Muscle weakness (generalized): Secondary | ICD-10-CM | POA: Diagnosis not present

## 2016-08-12 DIAGNOSIS — R41841 Cognitive communication deficit: Secondary | ICD-10-CM | POA: Diagnosis not present

## 2016-08-12 DIAGNOSIS — I639 Cerebral infarction, unspecified: Secondary | ICD-10-CM | POA: Diagnosis not present

## 2016-08-13 DIAGNOSIS — M6281 Muscle weakness (generalized): Secondary | ICD-10-CM | POA: Diagnosis not present

## 2016-08-13 DIAGNOSIS — R41841 Cognitive communication deficit: Secondary | ICD-10-CM | POA: Diagnosis not present

## 2016-08-13 DIAGNOSIS — I639 Cerebral infarction, unspecified: Secondary | ICD-10-CM | POA: Diagnosis not present

## 2016-08-13 DIAGNOSIS — R2689 Other abnormalities of gait and mobility: Secondary | ICD-10-CM | POA: Diagnosis not present

## 2016-08-13 DIAGNOSIS — R279 Unspecified lack of coordination: Secondary | ICD-10-CM | POA: Diagnosis not present

## 2016-08-16 DIAGNOSIS — R279 Unspecified lack of coordination: Secondary | ICD-10-CM | POA: Diagnosis not present

## 2016-08-16 DIAGNOSIS — R2689 Other abnormalities of gait and mobility: Secondary | ICD-10-CM | POA: Diagnosis not present

## 2016-08-16 DIAGNOSIS — I639 Cerebral infarction, unspecified: Secondary | ICD-10-CM | POA: Diagnosis not present

## 2016-08-16 DIAGNOSIS — M6281 Muscle weakness (generalized): Secondary | ICD-10-CM | POA: Diagnosis not present

## 2016-08-16 DIAGNOSIS — R41841 Cognitive communication deficit: Secondary | ICD-10-CM | POA: Diagnosis not present

## 2016-08-17 DIAGNOSIS — R279 Unspecified lack of coordination: Secondary | ICD-10-CM | POA: Diagnosis not present

## 2016-08-17 DIAGNOSIS — M6281 Muscle weakness (generalized): Secondary | ICD-10-CM | POA: Diagnosis not present

## 2016-08-17 DIAGNOSIS — R2689 Other abnormalities of gait and mobility: Secondary | ICD-10-CM | POA: Diagnosis not present

## 2016-08-17 DIAGNOSIS — I639 Cerebral infarction, unspecified: Secondary | ICD-10-CM | POA: Diagnosis not present

## 2016-08-17 DIAGNOSIS — R41841 Cognitive communication deficit: Secondary | ICD-10-CM | POA: Diagnosis not present

## 2016-08-18 DIAGNOSIS — R279 Unspecified lack of coordination: Secondary | ICD-10-CM | POA: Diagnosis not present

## 2016-08-18 DIAGNOSIS — R41841 Cognitive communication deficit: Secondary | ICD-10-CM | POA: Diagnosis not present

## 2016-08-18 DIAGNOSIS — M6281 Muscle weakness (generalized): Secondary | ICD-10-CM | POA: Diagnosis not present

## 2016-08-18 DIAGNOSIS — I639 Cerebral infarction, unspecified: Secondary | ICD-10-CM | POA: Diagnosis not present

## 2016-08-18 DIAGNOSIS — R2689 Other abnormalities of gait and mobility: Secondary | ICD-10-CM | POA: Diagnosis not present

## 2016-08-19 DIAGNOSIS — M6281 Muscle weakness (generalized): Secondary | ICD-10-CM | POA: Diagnosis not present

## 2016-08-19 DIAGNOSIS — R2689 Other abnormalities of gait and mobility: Secondary | ICD-10-CM | POA: Diagnosis not present

## 2016-08-19 DIAGNOSIS — R41841 Cognitive communication deficit: Secondary | ICD-10-CM | POA: Diagnosis not present

## 2016-08-19 DIAGNOSIS — R279 Unspecified lack of coordination: Secondary | ICD-10-CM | POA: Diagnosis not present

## 2016-08-19 DIAGNOSIS — I639 Cerebral infarction, unspecified: Secondary | ICD-10-CM | POA: Diagnosis not present

## 2016-08-20 DIAGNOSIS — I639 Cerebral infarction, unspecified: Secondary | ICD-10-CM | POA: Diagnosis not present

## 2016-08-20 DIAGNOSIS — R2689 Other abnormalities of gait and mobility: Secondary | ICD-10-CM | POA: Diagnosis not present

## 2016-08-20 DIAGNOSIS — R279 Unspecified lack of coordination: Secondary | ICD-10-CM | POA: Diagnosis not present

## 2016-08-20 DIAGNOSIS — R41841 Cognitive communication deficit: Secondary | ICD-10-CM | POA: Diagnosis not present

## 2016-08-20 DIAGNOSIS — M6281 Muscle weakness (generalized): Secondary | ICD-10-CM | POA: Diagnosis not present

## 2016-08-23 DIAGNOSIS — R41841 Cognitive communication deficit: Secondary | ICD-10-CM | POA: Diagnosis not present

## 2016-08-23 DIAGNOSIS — I639 Cerebral infarction, unspecified: Secondary | ICD-10-CM | POA: Diagnosis not present

## 2016-08-23 DIAGNOSIS — M6281 Muscle weakness (generalized): Secondary | ICD-10-CM | POA: Diagnosis not present

## 2016-08-23 DIAGNOSIS — R2689 Other abnormalities of gait and mobility: Secondary | ICD-10-CM | POA: Diagnosis not present

## 2016-08-23 DIAGNOSIS — R279 Unspecified lack of coordination: Secondary | ICD-10-CM | POA: Diagnosis not present

## 2016-08-25 DIAGNOSIS — I639 Cerebral infarction, unspecified: Secondary | ICD-10-CM | POA: Diagnosis not present

## 2016-08-25 DIAGNOSIS — R279 Unspecified lack of coordination: Secondary | ICD-10-CM | POA: Diagnosis not present

## 2016-08-25 DIAGNOSIS — R41841 Cognitive communication deficit: Secondary | ICD-10-CM | POA: Diagnosis not present

## 2016-08-25 DIAGNOSIS — R2689 Other abnormalities of gait and mobility: Secondary | ICD-10-CM | POA: Diagnosis not present

## 2016-08-25 DIAGNOSIS — M6281 Muscle weakness (generalized): Secondary | ICD-10-CM | POA: Diagnosis not present

## 2016-08-26 DIAGNOSIS — R41841 Cognitive communication deficit: Secondary | ICD-10-CM | POA: Diagnosis not present

## 2016-08-26 DIAGNOSIS — F0391 Unspecified dementia with behavioral disturbance: Secondary | ICD-10-CM | POA: Diagnosis not present

## 2016-08-26 DIAGNOSIS — N400 Enlarged prostate without lower urinary tract symptoms: Secondary | ICD-10-CM | POA: Diagnosis not present

## 2016-08-26 DIAGNOSIS — R279 Unspecified lack of coordination: Secondary | ICD-10-CM | POA: Diagnosis not present

## 2016-08-26 DIAGNOSIS — F322 Major depressive disorder, single episode, severe without psychotic features: Secondary | ICD-10-CM | POA: Diagnosis not present

## 2016-08-26 DIAGNOSIS — I10 Essential (primary) hypertension: Secondary | ICD-10-CM | POA: Diagnosis not present

## 2016-08-26 DIAGNOSIS — I639 Cerebral infarction, unspecified: Secondary | ICD-10-CM | POA: Diagnosis not present

## 2016-08-26 DIAGNOSIS — R2689 Other abnormalities of gait and mobility: Secondary | ICD-10-CM | POA: Diagnosis not present

## 2016-08-26 DIAGNOSIS — I2510 Atherosclerotic heart disease of native coronary artery without angina pectoris: Secondary | ICD-10-CM | POA: Diagnosis not present

## 2016-08-26 DIAGNOSIS — M6281 Muscle weakness (generalized): Secondary | ICD-10-CM | POA: Diagnosis not present

## 2016-08-30 DIAGNOSIS — M6281 Muscle weakness (generalized): Secondary | ICD-10-CM | POA: Diagnosis not present

## 2016-08-30 DIAGNOSIS — R41841 Cognitive communication deficit: Secondary | ICD-10-CM | POA: Diagnosis not present

## 2016-08-30 DIAGNOSIS — R2689 Other abnormalities of gait and mobility: Secondary | ICD-10-CM | POA: Diagnosis not present

## 2016-08-30 DIAGNOSIS — R279 Unspecified lack of coordination: Secondary | ICD-10-CM | POA: Diagnosis not present

## 2016-08-30 DIAGNOSIS — I639 Cerebral infarction, unspecified: Secondary | ICD-10-CM | POA: Diagnosis not present

## 2016-09-01 DIAGNOSIS — R2689 Other abnormalities of gait and mobility: Secondary | ICD-10-CM | POA: Diagnosis not present

## 2016-09-01 DIAGNOSIS — R279 Unspecified lack of coordination: Secondary | ICD-10-CM | POA: Diagnosis not present

## 2016-09-01 DIAGNOSIS — I639 Cerebral infarction, unspecified: Secondary | ICD-10-CM | POA: Diagnosis not present

## 2016-09-01 DIAGNOSIS — M6281 Muscle weakness (generalized): Secondary | ICD-10-CM | POA: Diagnosis not present

## 2016-09-01 DIAGNOSIS — R41841 Cognitive communication deficit: Secondary | ICD-10-CM | POA: Diagnosis not present

## 2016-09-02 DIAGNOSIS — R279 Unspecified lack of coordination: Secondary | ICD-10-CM | POA: Diagnosis not present

## 2016-09-02 DIAGNOSIS — M6281 Muscle weakness (generalized): Secondary | ICD-10-CM | POA: Diagnosis not present

## 2016-09-02 DIAGNOSIS — R41841 Cognitive communication deficit: Secondary | ICD-10-CM | POA: Diagnosis not present

## 2016-09-02 DIAGNOSIS — R2689 Other abnormalities of gait and mobility: Secondary | ICD-10-CM | POA: Diagnosis not present

## 2016-09-02 DIAGNOSIS — I639 Cerebral infarction, unspecified: Secondary | ICD-10-CM | POA: Diagnosis not present

## 2016-09-06 DIAGNOSIS — R279 Unspecified lack of coordination: Secondary | ICD-10-CM | POA: Diagnosis not present

## 2016-09-06 DIAGNOSIS — M6281 Muscle weakness (generalized): Secondary | ICD-10-CM | POA: Diagnosis not present

## 2016-09-06 DIAGNOSIS — R41841 Cognitive communication deficit: Secondary | ICD-10-CM | POA: Diagnosis not present

## 2016-09-06 DIAGNOSIS — R2689 Other abnormalities of gait and mobility: Secondary | ICD-10-CM | POA: Diagnosis not present

## 2016-09-06 DIAGNOSIS — I639 Cerebral infarction, unspecified: Secondary | ICD-10-CM | POA: Diagnosis not present

## 2016-09-08 DIAGNOSIS — M6281 Muscle weakness (generalized): Secondary | ICD-10-CM | POA: Diagnosis not present

## 2016-09-08 DIAGNOSIS — R279 Unspecified lack of coordination: Secondary | ICD-10-CM | POA: Diagnosis not present

## 2016-09-08 DIAGNOSIS — R2689 Other abnormalities of gait and mobility: Secondary | ICD-10-CM | POA: Diagnosis not present

## 2016-09-08 DIAGNOSIS — I639 Cerebral infarction, unspecified: Secondary | ICD-10-CM | POA: Diagnosis not present

## 2016-09-08 DIAGNOSIS — R41841 Cognitive communication deficit: Secondary | ICD-10-CM | POA: Diagnosis not present

## 2016-09-09 DIAGNOSIS — I639 Cerebral infarction, unspecified: Secondary | ICD-10-CM | POA: Diagnosis not present

## 2016-09-09 DIAGNOSIS — M6281 Muscle weakness (generalized): Secondary | ICD-10-CM | POA: Diagnosis not present

## 2016-09-09 DIAGNOSIS — R2689 Other abnormalities of gait and mobility: Secondary | ICD-10-CM | POA: Diagnosis not present

## 2016-09-09 DIAGNOSIS — F0390 Unspecified dementia without behavioral disturbance: Secondary | ICD-10-CM | POA: Diagnosis not present

## 2016-09-09 DIAGNOSIS — R279 Unspecified lack of coordination: Secondary | ICD-10-CM | POA: Diagnosis not present

## 2016-09-09 DIAGNOSIS — F329 Major depressive disorder, single episode, unspecified: Secondary | ICD-10-CM | POA: Diagnosis not present

## 2016-09-09 DIAGNOSIS — R41841 Cognitive communication deficit: Secondary | ICD-10-CM | POA: Diagnosis not present

## 2016-09-09 DIAGNOSIS — F419 Anxiety disorder, unspecified: Secondary | ICD-10-CM | POA: Diagnosis not present

## 2016-09-13 DIAGNOSIS — R41841 Cognitive communication deficit: Secondary | ICD-10-CM | POA: Diagnosis not present

## 2016-09-13 DIAGNOSIS — I639 Cerebral infarction, unspecified: Secondary | ICD-10-CM | POA: Diagnosis not present

## 2016-09-13 DIAGNOSIS — R2689 Other abnormalities of gait and mobility: Secondary | ICD-10-CM | POA: Diagnosis not present

## 2016-09-13 DIAGNOSIS — M6281 Muscle weakness (generalized): Secondary | ICD-10-CM | POA: Diagnosis not present

## 2016-09-13 DIAGNOSIS — R279 Unspecified lack of coordination: Secondary | ICD-10-CM | POA: Diagnosis not present

## 2016-09-15 DIAGNOSIS — R2689 Other abnormalities of gait and mobility: Secondary | ICD-10-CM | POA: Diagnosis not present

## 2016-09-15 DIAGNOSIS — I639 Cerebral infarction, unspecified: Secondary | ICD-10-CM | POA: Diagnosis not present

## 2016-09-15 DIAGNOSIS — M79674 Pain in right toe(s): Secondary | ICD-10-CM | POA: Diagnosis not present

## 2016-09-15 DIAGNOSIS — I70203 Unspecified atherosclerosis of native arteries of extremities, bilateral legs: Secondary | ICD-10-CM | POA: Diagnosis not present

## 2016-09-15 DIAGNOSIS — M6281 Muscle weakness (generalized): Secondary | ICD-10-CM | POA: Diagnosis not present

## 2016-09-15 DIAGNOSIS — R279 Unspecified lack of coordination: Secondary | ICD-10-CM | POA: Diagnosis not present

## 2016-09-15 DIAGNOSIS — R41841 Cognitive communication deficit: Secondary | ICD-10-CM | POA: Diagnosis not present

## 2016-09-15 DIAGNOSIS — B351 Tinea unguium: Secondary | ICD-10-CM | POA: Diagnosis not present

## 2016-09-15 DIAGNOSIS — I739 Peripheral vascular disease, unspecified: Secondary | ICD-10-CM | POA: Diagnosis not present

## 2016-09-23 DIAGNOSIS — I639 Cerebral infarction, unspecified: Secondary | ICD-10-CM | POA: Diagnosis not present

## 2016-09-23 DIAGNOSIS — R05 Cough: Secondary | ICD-10-CM | POA: Diagnosis not present

## 2016-09-23 DIAGNOSIS — F0391 Unspecified dementia with behavioral disturbance: Secondary | ICD-10-CM | POA: Diagnosis not present

## 2016-09-23 DIAGNOSIS — I2510 Atherosclerotic heart disease of native coronary artery without angina pectoris: Secondary | ICD-10-CM | POA: Diagnosis not present

## 2016-09-23 DIAGNOSIS — I10 Essential (primary) hypertension: Secondary | ICD-10-CM | POA: Diagnosis not present

## 2016-09-23 DIAGNOSIS — N400 Enlarged prostate without lower urinary tract symptoms: Secondary | ICD-10-CM | POA: Diagnosis not present

## 2016-09-23 DIAGNOSIS — N390 Urinary tract infection, site not specified: Secondary | ICD-10-CM | POA: Diagnosis not present

## 2016-10-12 DIAGNOSIS — R41841 Cognitive communication deficit: Secondary | ICD-10-CM | POA: Diagnosis not present

## 2016-10-12 DIAGNOSIS — I639 Cerebral infarction, unspecified: Secondary | ICD-10-CM | POA: Diagnosis not present

## 2016-10-12 DIAGNOSIS — R279 Unspecified lack of coordination: Secondary | ICD-10-CM | POA: Diagnosis not present

## 2016-10-12 DIAGNOSIS — M6281 Muscle weakness (generalized): Secondary | ICD-10-CM | POA: Diagnosis not present

## 2016-10-12 DIAGNOSIS — R2689 Other abnormalities of gait and mobility: Secondary | ICD-10-CM | POA: Diagnosis not present

## 2016-10-13 DIAGNOSIS — M6281 Muscle weakness (generalized): Secondary | ICD-10-CM | POA: Diagnosis not present

## 2016-10-13 DIAGNOSIS — I639 Cerebral infarction, unspecified: Secondary | ICD-10-CM | POA: Diagnosis not present

## 2016-10-13 DIAGNOSIS — R279 Unspecified lack of coordination: Secondary | ICD-10-CM | POA: Diagnosis not present

## 2016-10-13 DIAGNOSIS — R41841 Cognitive communication deficit: Secondary | ICD-10-CM | POA: Diagnosis not present

## 2016-10-13 DIAGNOSIS — R2689 Other abnormalities of gait and mobility: Secondary | ICD-10-CM | POA: Diagnosis not present

## 2016-10-15 DIAGNOSIS — M6281 Muscle weakness (generalized): Secondary | ICD-10-CM | POA: Diagnosis not present

## 2016-10-15 DIAGNOSIS — I679 Cerebrovascular disease, unspecified: Secondary | ICD-10-CM | POA: Diagnosis not present

## 2016-10-15 IMAGING — MR MR MRA HEAD W/O CM
9 of 12 series · 31 of 48 positions shown · non-contrast
Comparison: Head CT from earlier the same day

CLINICAL DATA: Acute encephalopathy.

EXAM:
MRI HEAD WITHOUT CONTRAST
MRA HEAD WITHOUT CONTRAST
TECHNIQUE: Multiplanar, multiecho pulse sequences of the brain and surrounding
structures were obtained without intravenous contrast. Angiographic
images of the head were obtained using MRA technique without
contrast.

[Series 4: T1 · sagittal · 5.0mm · 0.47mm/px · 1 of 23 slices shown]
[im 1/23]
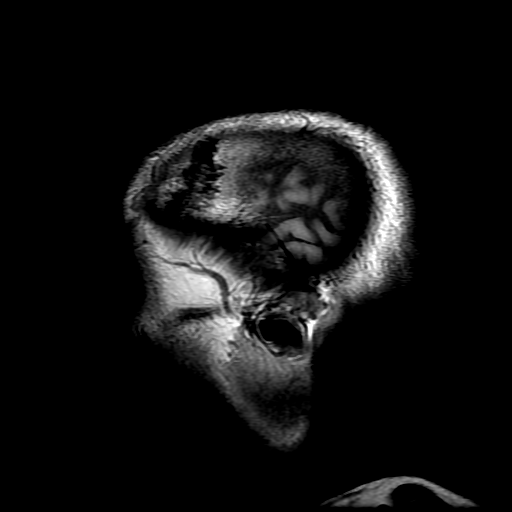

[Series 5: (id) mt fs · axial · 1.4mm · 0.43mm/px · z∈[-68,-45]mm · 3 of 136 slices shown]
[im 1/136]
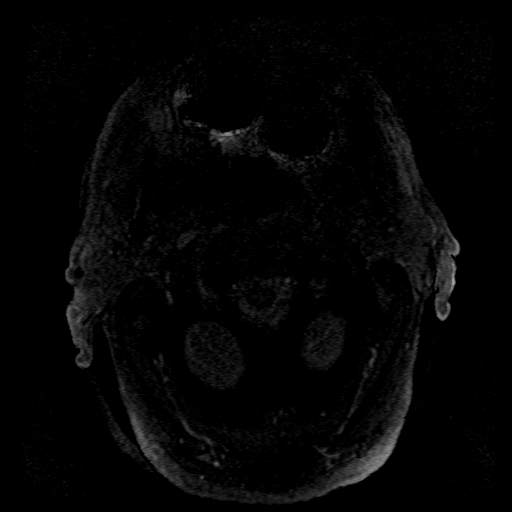
[im 17/136]
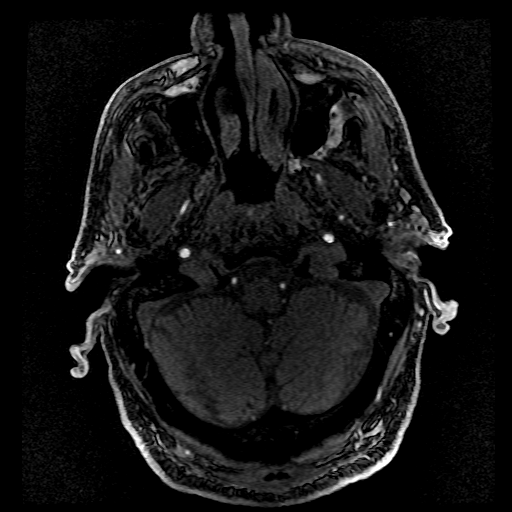
[im 34/136]
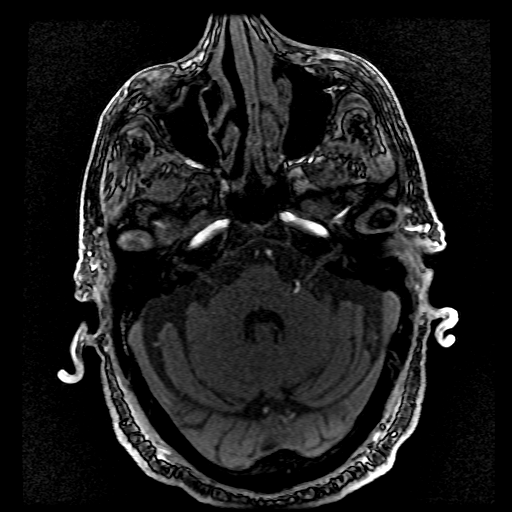

[Series 6: DWI · axial · 3.0mm · 1.09mm/px · z∈[-98,+49]mm · 8 of 102 slices shown (1 of 4)]
[im 1/102]
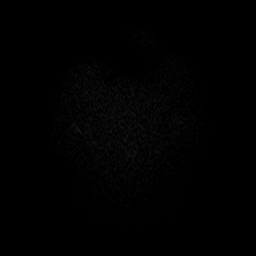
[im 15/102]
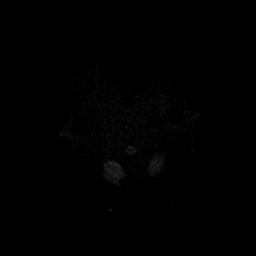
[im 29/102]
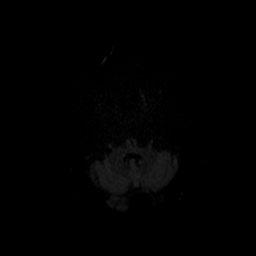
[im 44/102]
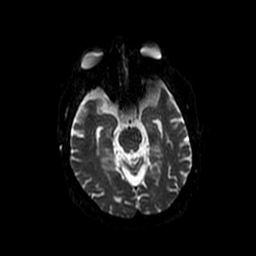
[im 58/102]
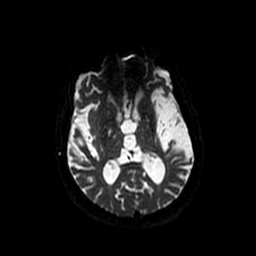
[im 73/102]
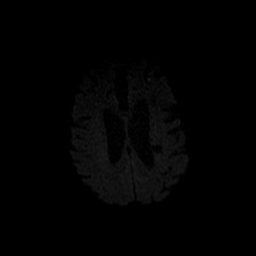
[im 87/102]
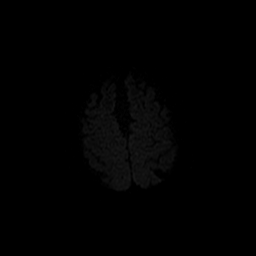
[im 102/102]
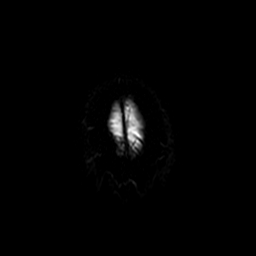

[Series 7: T2 · axial · 5.0mm · 0.43mm/px · z∈[-81,+61]mm · 2 of 25 slices shown (1 of 2)]
[im 1/25]
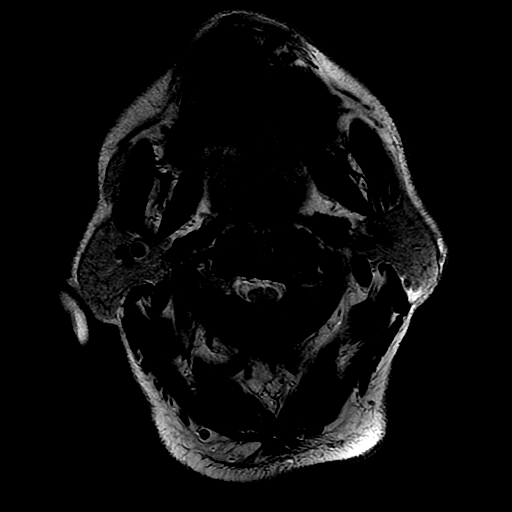
[im 25/25]
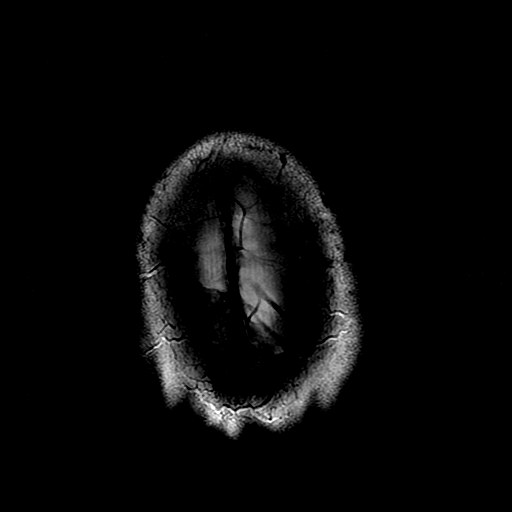

[Series 8: DWI · coronal · 5.0mm · 1.09mm/px · 6 of 74 slices shown (2 of 4)]
[im 1/74]
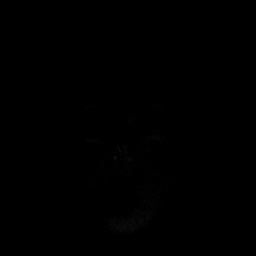
[im 15/74]
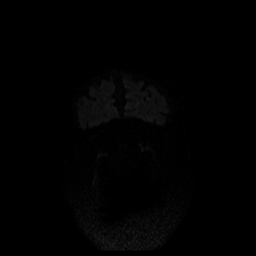
[im 30/74]
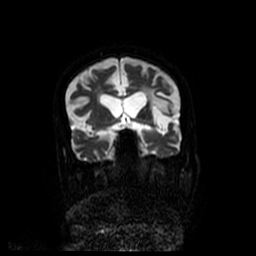
[im 44/74]
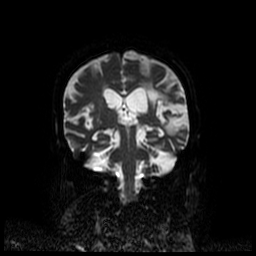
[im 59/74]
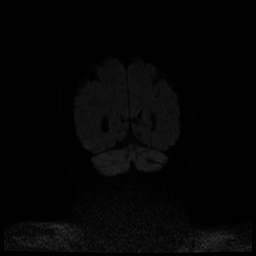
[im 74/74]
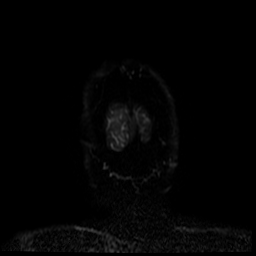

[Series 9: FLAIR · axial · 5.0mm · 0.43mm/px · z∈[-73,+73]mm · 2 of 22 slices shown]
[im 1/22]
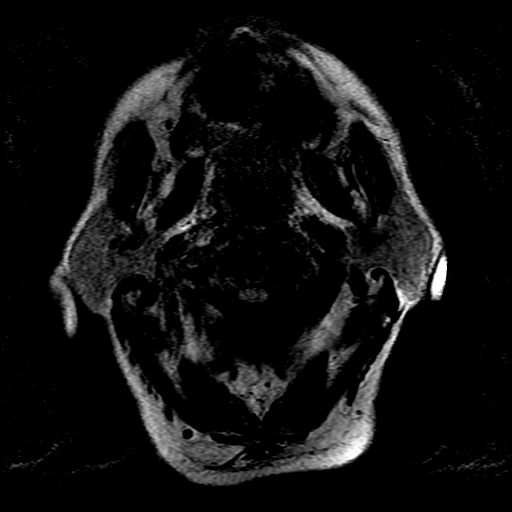
[im 22/22]
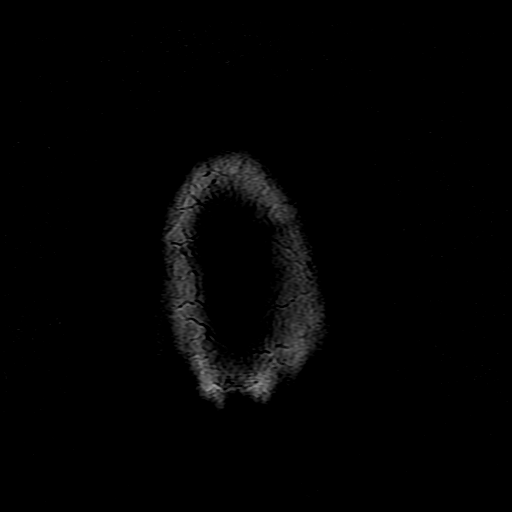

[Series 12: T2 · coronal · 5.0mm · 0.39mm/px · 2 of 29 slices shown (2 of 2)]
[im 1/29]
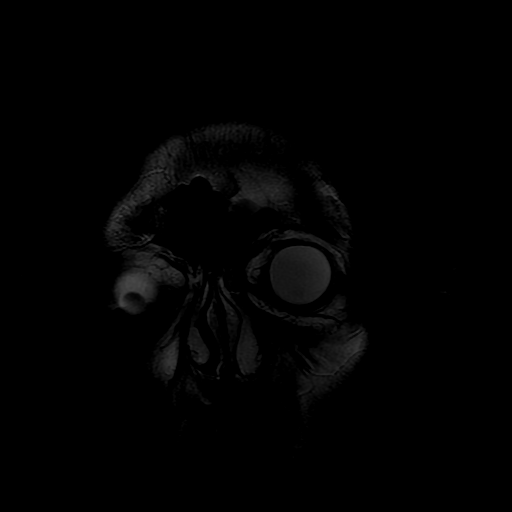
[im 29/29]
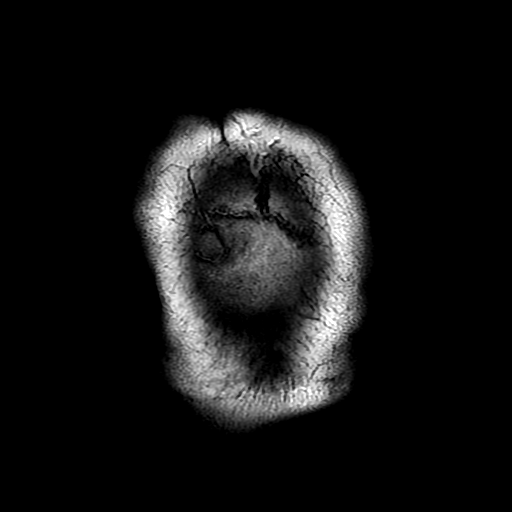

[Series 600: DWI · axial · 3.0mm · 1.09mm/px · z∈[-98,+49]mm · 4 of 51 slices shown (3 of 4)]
[im 1/51]
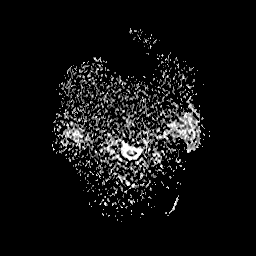
[im 17/51]
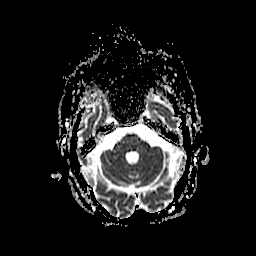
[im 34/51]
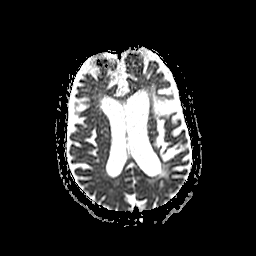
[im 51/51]
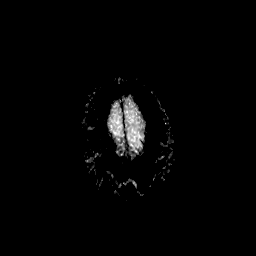

[Series 800: DWI · coronal · 5.0mm · 1.09mm/px · 3 of 37 slices shown (4 of 4)]
[im 1/37]
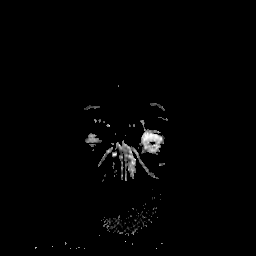
[im 19/37]
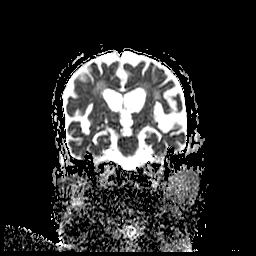
[im 37/37]
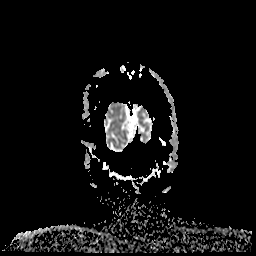

[31 of 48 positions shown; findings below may reference images not displayed]

FINDINGS: MRI HEAD FINDINGS

Calvarium and upper cervical spine: No focal marrow signal
abnormality.

The posterior ring of C1 is hypoplastic, resulting in deformity of
the posterior spinal cord, similar to increased from 6663.
Degenerative endplate ridging C3-4 also deforms the ventral cord at
this level.

Orbits: No significant findings.

Sinuses: Clear. Mastoid and middle ears are clear.

Brain: Numerous punctate acute infarcts in the infra and
supratentorial brain. Infratentorially, the infarcts are distributed
throughout the bilateral cerebellum, up to 9 mm in diameter on the
right. There could be a punctate infarct in the left upper pons, but
small size limits certainty. The cerebral infarcts are clustered in
the occipital and parietal cortex, although small infarcts are
present in the bilateral frontal lobes. No hemorrhagic conversion.
Arterial findings discussed below.

There is extensive remote ischemic injury. In the posterior fossa,
there have been remote infarcts of the bilateral pons and cerebellar
hemispheres. There has been a large central MCA territory infarct on
the left with loss of the insula and opercular cortex. An associated
lacune in the left corona radiata is also a remote. Ischemic gliosis
is confluent throughout the bilateral cerebral white matter.

There 2 small foci of remote hemorrhage in the right parietal
cortex.

No hydrocephalus, mass lesion, or shift.

MRA HEAD FINDINGS

Posterior circulation: The vertebral arteries are balanced and
small. There is a moderate to high-grade stenosis of the left V4
segment proximal to the patent PICA. Afterward, the left vertebral
artery is diminutive with poor flow. The majority of the basilar
flow comes from the right vertebral artery which has a hypoplastic
PICA and dominant AICA. The basilar artery is hypoplastic in the
setting of large posterior communicating arteries, likely
exacerbated by inflow disease. On conventional imaging there is no
signal change to suggest superimposed dissection. There is a
high-grade stenosis of the left P1 segment, well compensated by
large posterior communicating artery. Moderate multi focal
atherosclerotic narrowing of the bilateral PCAs.

Anterior circulation: Notable anatomic variants include large
posterior communicating arteries, hypoplastic right A1, and
duplicated anterior communicating arteries.

The right cavernous carotid has atherosclerosis along its entirety,
with at least moderate stenosis at its mid portion. The upper
division of the right MCA has a high-grade short-segment stenosis
just beyond the bifurcation.

There is atherosclerotic irregularity of the left cavernous carotid
without notable narrowing. The left A1 and MCA branches are widely
patent.

There is no evidence of aneurysm (when accounting for duplicated
anterior communicating artery).

Unusual prominence of right mesencephalic and prepontine vein with
time-of-flight signal. Despite this, there is no distention typical
of an arterial venous malformation or fistula.
IMPRESSION: 1. Multiple acute punctate infarcts in the anterior and posterior
circulation. This pattern suggests a central embolic process.
2. Remote infarcts of the brainstem, bilateral cerebellum, and
central left MCA territory.
3. Extensive intracranial atherosclerosis with advanced stenoses of
the left V4, upper division right MCA branch, right cavernous
carotid, and left P1.
4. Chronic upper spinal canal stenosis secondary to hypoplastic C1
ring.

## 2016-10-18 DIAGNOSIS — M6281 Muscle weakness (generalized): Secondary | ICD-10-CM | POA: Diagnosis not present

## 2016-10-18 DIAGNOSIS — I679 Cerebrovascular disease, unspecified: Secondary | ICD-10-CM | POA: Diagnosis not present

## 2016-10-20 DIAGNOSIS — M6281 Muscle weakness (generalized): Secondary | ICD-10-CM | POA: Diagnosis not present

## 2016-10-20 DIAGNOSIS — I679 Cerebrovascular disease, unspecified: Secondary | ICD-10-CM | POA: Diagnosis not present

## 2016-10-22 DIAGNOSIS — I679 Cerebrovascular disease, unspecified: Secondary | ICD-10-CM | POA: Diagnosis not present

## 2016-10-22 DIAGNOSIS — M6281 Muscle weakness (generalized): Secondary | ICD-10-CM | POA: Diagnosis not present

## 2016-10-24 DIAGNOSIS — M6281 Muscle weakness (generalized): Secondary | ICD-10-CM | POA: Diagnosis not present

## 2016-10-24 DIAGNOSIS — I679 Cerebrovascular disease, unspecified: Secondary | ICD-10-CM | POA: Diagnosis not present

## 2016-10-26 DIAGNOSIS — I679 Cerebrovascular disease, unspecified: Secondary | ICD-10-CM | POA: Diagnosis not present

## 2016-10-26 DIAGNOSIS — M6281 Muscle weakness (generalized): Secondary | ICD-10-CM | POA: Diagnosis not present

## 2016-10-28 DIAGNOSIS — I679 Cerebrovascular disease, unspecified: Secondary | ICD-10-CM | POA: Diagnosis not present

## 2016-10-28 DIAGNOSIS — M6281 Muscle weakness (generalized): Secondary | ICD-10-CM | POA: Diagnosis not present

## 2016-10-28 DIAGNOSIS — N400 Enlarged prostate without lower urinary tract symptoms: Secondary | ICD-10-CM | POA: Diagnosis not present

## 2016-10-28 DIAGNOSIS — I639 Cerebral infarction, unspecified: Secondary | ICD-10-CM | POA: Diagnosis not present

## 2016-10-28 DIAGNOSIS — I10 Essential (primary) hypertension: Secondary | ICD-10-CM | POA: Diagnosis not present

## 2016-10-28 DIAGNOSIS — F0391 Unspecified dementia with behavioral disturbance: Secondary | ICD-10-CM | POA: Diagnosis not present

## 2016-11-02 DIAGNOSIS — M6281 Muscle weakness (generalized): Secondary | ICD-10-CM | POA: Diagnosis not present

## 2016-11-02 DIAGNOSIS — I679 Cerebrovascular disease, unspecified: Secondary | ICD-10-CM | POA: Diagnosis not present

## 2016-11-04 DIAGNOSIS — M6281 Muscle weakness (generalized): Secondary | ICD-10-CM | POA: Diagnosis not present

## 2016-11-04 DIAGNOSIS — I679 Cerebrovascular disease, unspecified: Secondary | ICD-10-CM | POA: Diagnosis not present

## 2016-11-05 DIAGNOSIS — I10 Essential (primary) hypertension: Secondary | ICD-10-CM | POA: Diagnosis not present

## 2016-11-06 DIAGNOSIS — I679 Cerebrovascular disease, unspecified: Secondary | ICD-10-CM | POA: Diagnosis not present

## 2016-11-06 DIAGNOSIS — M6281 Muscle weakness (generalized): Secondary | ICD-10-CM | POA: Diagnosis not present

## 2016-11-08 DIAGNOSIS — M6281 Muscle weakness (generalized): Secondary | ICD-10-CM | POA: Diagnosis not present

## 2016-11-08 DIAGNOSIS — I679 Cerebrovascular disease, unspecified: Secondary | ICD-10-CM | POA: Diagnosis not present

## 2016-11-09 DIAGNOSIS — M6281 Muscle weakness (generalized): Secondary | ICD-10-CM | POA: Diagnosis not present

## 2016-11-09 DIAGNOSIS — I679 Cerebrovascular disease, unspecified: Secondary | ICD-10-CM | POA: Diagnosis not present

## 2016-11-11 DIAGNOSIS — M6281 Muscle weakness (generalized): Secondary | ICD-10-CM | POA: Diagnosis not present

## 2016-11-11 DIAGNOSIS — I679 Cerebrovascular disease, unspecified: Secondary | ICD-10-CM | POA: Diagnosis not present

## 2016-11-12 DIAGNOSIS — M6281 Muscle weakness (generalized): Secondary | ICD-10-CM | POA: Diagnosis not present

## 2016-11-12 DIAGNOSIS — I679 Cerebrovascular disease, unspecified: Secondary | ICD-10-CM | POA: Diagnosis not present

## 2016-11-15 DIAGNOSIS — I679 Cerebrovascular disease, unspecified: Secondary | ICD-10-CM | POA: Diagnosis not present

## 2016-11-15 DIAGNOSIS — M6281 Muscle weakness (generalized): Secondary | ICD-10-CM | POA: Diagnosis not present

## 2016-11-16 DIAGNOSIS — M6281 Muscle weakness (generalized): Secondary | ICD-10-CM | POA: Diagnosis not present

## 2016-11-16 DIAGNOSIS — I679 Cerebrovascular disease, unspecified: Secondary | ICD-10-CM | POA: Diagnosis not present

## 2016-11-17 DIAGNOSIS — I679 Cerebrovascular disease, unspecified: Secondary | ICD-10-CM | POA: Diagnosis not present

## 2016-11-17 DIAGNOSIS — M6281 Muscle weakness (generalized): Secondary | ICD-10-CM | POA: Diagnosis not present

## 2016-11-22 DIAGNOSIS — I679 Cerebrovascular disease, unspecified: Secondary | ICD-10-CM | POA: Diagnosis not present

## 2016-11-22 DIAGNOSIS — M6281 Muscle weakness (generalized): Secondary | ICD-10-CM | POA: Diagnosis not present

## 2016-11-22 DIAGNOSIS — I10 Essential (primary) hypertension: Secondary | ICD-10-CM | POA: Diagnosis not present

## 2016-11-23 DIAGNOSIS — I679 Cerebrovascular disease, unspecified: Secondary | ICD-10-CM | POA: Diagnosis not present

## 2016-11-23 DIAGNOSIS — M6281 Muscle weakness (generalized): Secondary | ICD-10-CM | POA: Diagnosis not present

## 2016-11-25 DIAGNOSIS — I679 Cerebrovascular disease, unspecified: Secondary | ICD-10-CM | POA: Diagnosis not present

## 2016-11-25 DIAGNOSIS — I10 Essential (primary) hypertension: Secondary | ICD-10-CM | POA: Diagnosis not present

## 2016-11-25 DIAGNOSIS — I2510 Atherosclerotic heart disease of native coronary artery without angina pectoris: Secondary | ICD-10-CM | POA: Diagnosis not present

## 2016-11-25 DIAGNOSIS — F0391 Unspecified dementia with behavioral disturbance: Secondary | ICD-10-CM | POA: Diagnosis not present

## 2016-11-25 DIAGNOSIS — I639 Cerebral infarction, unspecified: Secondary | ICD-10-CM | POA: Diagnosis not present

## 2016-11-25 DIAGNOSIS — M6281 Muscle weakness (generalized): Secondary | ICD-10-CM | POA: Diagnosis not present

## 2016-11-25 DIAGNOSIS — E669 Obesity, unspecified: Secondary | ICD-10-CM | POA: Diagnosis not present

## 2016-11-25 DIAGNOSIS — N400 Enlarged prostate without lower urinary tract symptoms: Secondary | ICD-10-CM | POA: Diagnosis not present

## 2016-11-30 DIAGNOSIS — I679 Cerebrovascular disease, unspecified: Secondary | ICD-10-CM | POA: Diagnosis not present

## 2016-11-30 DIAGNOSIS — M6281 Muscle weakness (generalized): Secondary | ICD-10-CM | POA: Diagnosis not present

## 2016-12-01 DIAGNOSIS — I679 Cerebrovascular disease, unspecified: Secondary | ICD-10-CM | POA: Diagnosis not present

## 2016-12-01 DIAGNOSIS — M6281 Muscle weakness (generalized): Secondary | ICD-10-CM | POA: Diagnosis not present

## 2016-12-03 DIAGNOSIS — M6281 Muscle weakness (generalized): Secondary | ICD-10-CM | POA: Diagnosis not present

## 2016-12-03 DIAGNOSIS — I679 Cerebrovascular disease, unspecified: Secondary | ICD-10-CM | POA: Diagnosis not present

## 2016-12-06 DIAGNOSIS — I679 Cerebrovascular disease, unspecified: Secondary | ICD-10-CM | POA: Diagnosis not present

## 2016-12-06 DIAGNOSIS — M6281 Muscle weakness (generalized): Secondary | ICD-10-CM | POA: Diagnosis not present

## 2016-12-07 DIAGNOSIS — I679 Cerebrovascular disease, unspecified: Secondary | ICD-10-CM | POA: Diagnosis not present

## 2016-12-07 DIAGNOSIS — M6281 Muscle weakness (generalized): Secondary | ICD-10-CM | POA: Diagnosis not present

## 2016-12-09 DIAGNOSIS — F0390 Unspecified dementia without behavioral disturbance: Secondary | ICD-10-CM | POA: Diagnosis not present

## 2016-12-09 DIAGNOSIS — F329 Major depressive disorder, single episode, unspecified: Secondary | ICD-10-CM | POA: Diagnosis not present

## 2016-12-09 DIAGNOSIS — I679 Cerebrovascular disease, unspecified: Secondary | ICD-10-CM | POA: Diagnosis not present

## 2016-12-09 DIAGNOSIS — M6281 Muscle weakness (generalized): Secondary | ICD-10-CM | POA: Diagnosis not present

## 2016-12-09 DIAGNOSIS — F419 Anxiety disorder, unspecified: Secondary | ICD-10-CM | POA: Diagnosis not present

## 2016-12-13 DIAGNOSIS — I679 Cerebrovascular disease, unspecified: Secondary | ICD-10-CM | POA: Diagnosis not present

## 2016-12-13 DIAGNOSIS — M6281 Muscle weakness (generalized): Secondary | ICD-10-CM | POA: Diagnosis not present

## 2016-12-14 DIAGNOSIS — M6281 Muscle weakness (generalized): Secondary | ICD-10-CM | POA: Diagnosis not present

## 2016-12-14 DIAGNOSIS — I679 Cerebrovascular disease, unspecified: Secondary | ICD-10-CM | POA: Diagnosis not present

## 2016-12-16 DIAGNOSIS — I679 Cerebrovascular disease, unspecified: Secondary | ICD-10-CM | POA: Diagnosis not present

## 2016-12-16 DIAGNOSIS — M6281 Muscle weakness (generalized): Secondary | ICD-10-CM | POA: Diagnosis not present

## 2016-12-20 DIAGNOSIS — M6281 Muscle weakness (generalized): Secondary | ICD-10-CM | POA: Diagnosis not present

## 2016-12-20 DIAGNOSIS — I679 Cerebrovascular disease, unspecified: Secondary | ICD-10-CM | POA: Diagnosis not present

## 2016-12-21 DIAGNOSIS — I679 Cerebrovascular disease, unspecified: Secondary | ICD-10-CM | POA: Diagnosis not present

## 2016-12-21 DIAGNOSIS — M6281 Muscle weakness (generalized): Secondary | ICD-10-CM | POA: Diagnosis not present

## 2016-12-23 DIAGNOSIS — I2510 Atherosclerotic heart disease of native coronary artery without angina pectoris: Secondary | ICD-10-CM | POA: Diagnosis not present

## 2016-12-23 DIAGNOSIS — N400 Enlarged prostate without lower urinary tract symptoms: Secondary | ICD-10-CM | POA: Diagnosis not present

## 2016-12-23 DIAGNOSIS — I10 Essential (primary) hypertension: Secondary | ICD-10-CM | POA: Diagnosis not present

## 2016-12-23 DIAGNOSIS — I679 Cerebrovascular disease, unspecified: Secondary | ICD-10-CM | POA: Diagnosis not present

## 2016-12-23 DIAGNOSIS — I639 Cerebral infarction, unspecified: Secondary | ICD-10-CM | POA: Diagnosis not present

## 2016-12-23 DIAGNOSIS — M6281 Muscle weakness (generalized): Secondary | ICD-10-CM | POA: Diagnosis not present

## 2016-12-23 DIAGNOSIS — F0391 Unspecified dementia with behavioral disturbance: Secondary | ICD-10-CM | POA: Diagnosis not present

## 2016-12-27 DIAGNOSIS — I679 Cerebrovascular disease, unspecified: Secondary | ICD-10-CM | POA: Diagnosis not present

## 2016-12-27 DIAGNOSIS — M6281 Muscle weakness (generalized): Secondary | ICD-10-CM | POA: Diagnosis not present

## 2016-12-28 DIAGNOSIS — M6281 Muscle weakness (generalized): Secondary | ICD-10-CM | POA: Diagnosis not present

## 2016-12-28 DIAGNOSIS — I679 Cerebrovascular disease, unspecified: Secondary | ICD-10-CM | POA: Diagnosis not present

## 2016-12-30 DIAGNOSIS — M6281 Muscle weakness (generalized): Secondary | ICD-10-CM | POA: Diagnosis not present

## 2016-12-30 DIAGNOSIS — I679 Cerebrovascular disease, unspecified: Secondary | ICD-10-CM | POA: Diagnosis not present

## 2017-01-04 DIAGNOSIS — M6281 Muscle weakness (generalized): Secondary | ICD-10-CM | POA: Diagnosis not present

## 2017-01-04 DIAGNOSIS — I679 Cerebrovascular disease, unspecified: Secondary | ICD-10-CM | POA: Diagnosis not present

## 2017-01-06 DIAGNOSIS — I679 Cerebrovascular disease, unspecified: Secondary | ICD-10-CM | POA: Diagnosis not present

## 2017-01-06 DIAGNOSIS — M6281 Muscle weakness (generalized): Secondary | ICD-10-CM | POA: Diagnosis not present

## 2017-01-07 DIAGNOSIS — I679 Cerebrovascular disease, unspecified: Secondary | ICD-10-CM | POA: Diagnosis not present

## 2017-01-07 DIAGNOSIS — M6281 Muscle weakness (generalized): Secondary | ICD-10-CM | POA: Diagnosis not present

## 2017-01-10 DIAGNOSIS — M6281 Muscle weakness (generalized): Secondary | ICD-10-CM | POA: Diagnosis not present

## 2017-01-10 DIAGNOSIS — I679 Cerebrovascular disease, unspecified: Secondary | ICD-10-CM | POA: Diagnosis not present

## 2017-01-11 DIAGNOSIS — M6281 Muscle weakness (generalized): Secondary | ICD-10-CM | POA: Diagnosis not present

## 2017-01-11 DIAGNOSIS — I679 Cerebrovascular disease, unspecified: Secondary | ICD-10-CM | POA: Diagnosis not present

## 2017-01-13 DIAGNOSIS — I679 Cerebrovascular disease, unspecified: Secondary | ICD-10-CM | POA: Diagnosis not present

## 2017-01-13 DIAGNOSIS — M6281 Muscle weakness (generalized): Secondary | ICD-10-CM | POA: Diagnosis not present

## 2017-01-18 DIAGNOSIS — I679 Cerebrovascular disease, unspecified: Secondary | ICD-10-CM | POA: Diagnosis not present

## 2017-01-18 DIAGNOSIS — M6281 Muscle weakness (generalized): Secondary | ICD-10-CM | POA: Diagnosis not present

## 2017-01-20 DIAGNOSIS — M6281 Muscle weakness (generalized): Secondary | ICD-10-CM | POA: Diagnosis not present

## 2017-01-20 DIAGNOSIS — I679 Cerebrovascular disease, unspecified: Secondary | ICD-10-CM | POA: Diagnosis not present

## 2017-01-21 DIAGNOSIS — I679 Cerebrovascular disease, unspecified: Secondary | ICD-10-CM | POA: Diagnosis not present

## 2017-01-21 DIAGNOSIS — M6281 Muscle weakness (generalized): Secondary | ICD-10-CM | POA: Diagnosis not present

## 2017-01-24 DIAGNOSIS — I679 Cerebrovascular disease, unspecified: Secondary | ICD-10-CM | POA: Diagnosis not present

## 2017-01-24 DIAGNOSIS — M6281 Muscle weakness (generalized): Secondary | ICD-10-CM | POA: Diagnosis not present

## 2017-01-25 DIAGNOSIS — M6281 Muscle weakness (generalized): Secondary | ICD-10-CM | POA: Diagnosis not present

## 2017-01-25 DIAGNOSIS — I679 Cerebrovascular disease, unspecified: Secondary | ICD-10-CM | POA: Diagnosis not present

## 2017-01-27 DIAGNOSIS — M6281 Muscle weakness (generalized): Secondary | ICD-10-CM | POA: Diagnosis not present

## 2017-01-27 DIAGNOSIS — I639 Cerebral infarction, unspecified: Secondary | ICD-10-CM | POA: Diagnosis not present

## 2017-01-27 DIAGNOSIS — F0391 Unspecified dementia with behavioral disturbance: Secondary | ICD-10-CM | POA: Diagnosis not present

## 2017-01-27 DIAGNOSIS — E669 Obesity, unspecified: Secondary | ICD-10-CM | POA: Diagnosis not present

## 2017-01-27 DIAGNOSIS — N400 Enlarged prostate without lower urinary tract symptoms: Secondary | ICD-10-CM | POA: Diagnosis not present

## 2017-01-27 DIAGNOSIS — I10 Essential (primary) hypertension: Secondary | ICD-10-CM | POA: Diagnosis not present

## 2017-01-27 DIAGNOSIS — I679 Cerebrovascular disease, unspecified: Secondary | ICD-10-CM | POA: Diagnosis not present

## 2017-01-27 DIAGNOSIS — I2510 Atherosclerotic heart disease of native coronary artery without angina pectoris: Secondary | ICD-10-CM | POA: Diagnosis not present

## 2017-01-31 DIAGNOSIS — M6281 Muscle weakness (generalized): Secondary | ICD-10-CM | POA: Diagnosis not present

## 2017-01-31 DIAGNOSIS — I679 Cerebrovascular disease, unspecified: Secondary | ICD-10-CM | POA: Diagnosis not present

## 2017-02-02 DIAGNOSIS — I679 Cerebrovascular disease, unspecified: Secondary | ICD-10-CM | POA: Diagnosis not present

## 2017-02-02 DIAGNOSIS — M6281 Muscle weakness (generalized): Secondary | ICD-10-CM | POA: Diagnosis not present

## 2017-02-03 DIAGNOSIS — M6281 Muscle weakness (generalized): Secondary | ICD-10-CM | POA: Diagnosis not present

## 2017-02-03 DIAGNOSIS — I679 Cerebrovascular disease, unspecified: Secondary | ICD-10-CM | POA: Diagnosis not present

## 2017-02-07 DIAGNOSIS — M6281 Muscle weakness (generalized): Secondary | ICD-10-CM | POA: Diagnosis not present

## 2017-02-07 DIAGNOSIS — I679 Cerebrovascular disease, unspecified: Secondary | ICD-10-CM | POA: Diagnosis not present

## 2017-02-08 DIAGNOSIS — I679 Cerebrovascular disease, unspecified: Secondary | ICD-10-CM | POA: Diagnosis not present

## 2017-02-08 DIAGNOSIS — M6281 Muscle weakness (generalized): Secondary | ICD-10-CM | POA: Diagnosis not present

## 2017-02-09 DIAGNOSIS — M6281 Muscle weakness (generalized): Secondary | ICD-10-CM | POA: Diagnosis not present

## 2017-02-09 DIAGNOSIS — I679 Cerebrovascular disease, unspecified: Secondary | ICD-10-CM | POA: Diagnosis not present

## 2017-02-10 DIAGNOSIS — M6281 Muscle weakness (generalized): Secondary | ICD-10-CM | POA: Diagnosis not present

## 2017-02-10 DIAGNOSIS — I679 Cerebrovascular disease, unspecified: Secondary | ICD-10-CM | POA: Diagnosis not present

## 2017-02-11 DIAGNOSIS — I679 Cerebrovascular disease, unspecified: Secondary | ICD-10-CM | POA: Diagnosis not present

## 2017-02-11 DIAGNOSIS — M6281 Muscle weakness (generalized): Secondary | ICD-10-CM | POA: Diagnosis not present

## 2017-02-14 DIAGNOSIS — M6281 Muscle weakness (generalized): Secondary | ICD-10-CM | POA: Diagnosis not present

## 2017-02-14 DIAGNOSIS — I679 Cerebrovascular disease, unspecified: Secondary | ICD-10-CM | POA: Diagnosis not present

## 2017-02-16 DIAGNOSIS — M6281 Muscle weakness (generalized): Secondary | ICD-10-CM | POA: Diagnosis not present

## 2017-02-16 DIAGNOSIS — I679 Cerebrovascular disease, unspecified: Secondary | ICD-10-CM | POA: Diagnosis not present

## 2017-02-21 DIAGNOSIS — B351 Tinea unguium: Secondary | ICD-10-CM | POA: Diagnosis not present

## 2017-02-21 DIAGNOSIS — R600 Localized edema: Secondary | ICD-10-CM | POA: Diagnosis not present

## 2017-02-21 DIAGNOSIS — I739 Peripheral vascular disease, unspecified: Secondary | ICD-10-CM | POA: Diagnosis not present

## 2017-02-24 DIAGNOSIS — I10 Essential (primary) hypertension: Secondary | ICD-10-CM | POA: Diagnosis not present

## 2017-02-24 DIAGNOSIS — F0391 Unspecified dementia with behavioral disturbance: Secondary | ICD-10-CM | POA: Diagnosis not present

## 2017-02-24 DIAGNOSIS — I639 Cerebral infarction, unspecified: Secondary | ICD-10-CM | POA: Diagnosis not present

## 2017-02-24 DIAGNOSIS — I2510 Atherosclerotic heart disease of native coronary artery without angina pectoris: Secondary | ICD-10-CM | POA: Diagnosis not present

## 2017-02-24 DIAGNOSIS — N400 Enlarged prostate without lower urinary tract symptoms: Secondary | ICD-10-CM | POA: Diagnosis not present

## 2017-02-24 DIAGNOSIS — M1990 Unspecified osteoarthritis, unspecified site: Secondary | ICD-10-CM | POA: Diagnosis not present

## 2017-03-03 DIAGNOSIS — F329 Major depressive disorder, single episode, unspecified: Secondary | ICD-10-CM | POA: Diagnosis not present

## 2017-03-03 DIAGNOSIS — G4700 Insomnia, unspecified: Secondary | ICD-10-CM | POA: Diagnosis not present

## 2017-03-03 DIAGNOSIS — F411 Generalized anxiety disorder: Secondary | ICD-10-CM | POA: Diagnosis not present

## 2017-03-03 DIAGNOSIS — F0391 Unspecified dementia with behavioral disturbance: Secondary | ICD-10-CM | POA: Diagnosis not present

## 2017-03-24 DIAGNOSIS — F0391 Unspecified dementia with behavioral disturbance: Secondary | ICD-10-CM | POA: Diagnosis not present

## 2017-03-24 DIAGNOSIS — I2510 Atherosclerotic heart disease of native coronary artery without angina pectoris: Secondary | ICD-10-CM | POA: Diagnosis not present

## 2017-03-24 DIAGNOSIS — I639 Cerebral infarction, unspecified: Secondary | ICD-10-CM | POA: Diagnosis not present

## 2017-03-24 DIAGNOSIS — N400 Enlarged prostate without lower urinary tract symptoms: Secondary | ICD-10-CM | POA: Diagnosis not present

## 2017-03-24 DIAGNOSIS — I10 Essential (primary) hypertension: Secondary | ICD-10-CM | POA: Diagnosis not present

## 2017-04-15 DIAGNOSIS — Z23 Encounter for immunization: Secondary | ICD-10-CM | POA: Diagnosis not present

## 2017-04-21 DIAGNOSIS — F0391 Unspecified dementia with behavioral disturbance: Secondary | ICD-10-CM | POA: Diagnosis not present

## 2017-04-21 DIAGNOSIS — I2510 Atherosclerotic heart disease of native coronary artery without angina pectoris: Secondary | ICD-10-CM | POA: Diagnosis not present

## 2017-04-21 DIAGNOSIS — G4700 Insomnia, unspecified: Secondary | ICD-10-CM | POA: Diagnosis not present

## 2017-04-21 DIAGNOSIS — N400 Enlarged prostate without lower urinary tract symptoms: Secondary | ICD-10-CM | POA: Diagnosis not present

## 2017-04-21 DIAGNOSIS — I10 Essential (primary) hypertension: Secondary | ICD-10-CM | POA: Diagnosis not present

## 2017-04-21 DIAGNOSIS — I639 Cerebral infarction, unspecified: Secondary | ICD-10-CM | POA: Diagnosis not present

## 2017-04-21 DIAGNOSIS — F411 Generalized anxiety disorder: Secondary | ICD-10-CM | POA: Diagnosis not present

## 2017-04-21 DIAGNOSIS — F329 Major depressive disorder, single episode, unspecified: Secondary | ICD-10-CM | POA: Diagnosis not present

## 2017-05-09 DIAGNOSIS — I10 Essential (primary) hypertension: Secondary | ICD-10-CM | POA: Diagnosis not present

## 2017-05-19 DIAGNOSIS — R262 Difficulty in walking, not elsewhere classified: Secondary | ICD-10-CM | POA: Diagnosis not present

## 2017-05-19 DIAGNOSIS — I639 Cerebral infarction, unspecified: Secondary | ICD-10-CM | POA: Diagnosis not present

## 2017-05-20 DIAGNOSIS — R262 Difficulty in walking, not elsewhere classified: Secondary | ICD-10-CM | POA: Diagnosis not present

## 2017-05-20 DIAGNOSIS — I639 Cerebral infarction, unspecified: Secondary | ICD-10-CM | POA: Diagnosis not present

## 2017-05-23 DIAGNOSIS — R262 Difficulty in walking, not elsewhere classified: Secondary | ICD-10-CM | POA: Diagnosis not present

## 2017-05-23 DIAGNOSIS — I639 Cerebral infarction, unspecified: Secondary | ICD-10-CM | POA: Diagnosis not present

## 2017-05-24 DIAGNOSIS — R262 Difficulty in walking, not elsewhere classified: Secondary | ICD-10-CM | POA: Diagnosis not present

## 2017-05-24 DIAGNOSIS — I639 Cerebral infarction, unspecified: Secondary | ICD-10-CM | POA: Diagnosis not present

## 2017-05-25 DIAGNOSIS — I639 Cerebral infarction, unspecified: Secondary | ICD-10-CM | POA: Diagnosis not present

## 2017-05-25 DIAGNOSIS — R262 Difficulty in walking, not elsewhere classified: Secondary | ICD-10-CM | POA: Diagnosis not present

## 2017-05-25 DIAGNOSIS — I739 Peripheral vascular disease, unspecified: Secondary | ICD-10-CM | POA: Diagnosis not present

## 2017-05-25 DIAGNOSIS — B351 Tinea unguium: Secondary | ICD-10-CM | POA: Diagnosis not present

## 2017-05-26 DIAGNOSIS — N400 Enlarged prostate without lower urinary tract symptoms: Secondary | ICD-10-CM | POA: Diagnosis not present

## 2017-05-26 DIAGNOSIS — I639 Cerebral infarction, unspecified: Secondary | ICD-10-CM | POA: Diagnosis not present

## 2017-05-26 DIAGNOSIS — R262 Difficulty in walking, not elsewhere classified: Secondary | ICD-10-CM | POA: Diagnosis not present

## 2017-05-26 DIAGNOSIS — F0391 Unspecified dementia with behavioral disturbance: Secondary | ICD-10-CM | POA: Diagnosis not present

## 2017-05-26 DIAGNOSIS — F329 Major depressive disorder, single episode, unspecified: Secondary | ICD-10-CM | POA: Diagnosis not present

## 2017-05-26 DIAGNOSIS — F411 Generalized anxiety disorder: Secondary | ICD-10-CM | POA: Diagnosis not present

## 2017-05-26 DIAGNOSIS — I2510 Atherosclerotic heart disease of native coronary artery without angina pectoris: Secondary | ICD-10-CM | POA: Diagnosis not present

## 2017-05-26 DIAGNOSIS — I10 Essential (primary) hypertension: Secondary | ICD-10-CM | POA: Diagnosis not present

## 2017-05-26 DIAGNOSIS — G4700 Insomnia, unspecified: Secondary | ICD-10-CM | POA: Diagnosis not present

## 2017-05-27 DIAGNOSIS — I639 Cerebral infarction, unspecified: Secondary | ICD-10-CM | POA: Diagnosis not present

## 2017-05-27 DIAGNOSIS — R262 Difficulty in walking, not elsewhere classified: Secondary | ICD-10-CM | POA: Diagnosis not present

## 2017-05-30 DIAGNOSIS — R262 Difficulty in walking, not elsewhere classified: Secondary | ICD-10-CM | POA: Diagnosis not present

## 2017-05-30 DIAGNOSIS — I639 Cerebral infarction, unspecified: Secondary | ICD-10-CM | POA: Diagnosis not present

## 2017-05-31 DIAGNOSIS — I639 Cerebral infarction, unspecified: Secondary | ICD-10-CM | POA: Diagnosis not present

## 2017-05-31 DIAGNOSIS — R262 Difficulty in walking, not elsewhere classified: Secondary | ICD-10-CM | POA: Diagnosis not present

## 2017-06-02 DIAGNOSIS — R262 Difficulty in walking, not elsewhere classified: Secondary | ICD-10-CM | POA: Diagnosis not present

## 2017-06-02 DIAGNOSIS — I639 Cerebral infarction, unspecified: Secondary | ICD-10-CM | POA: Diagnosis not present

## 2017-06-03 DIAGNOSIS — I639 Cerebral infarction, unspecified: Secondary | ICD-10-CM | POA: Diagnosis not present

## 2017-06-03 DIAGNOSIS — R262 Difficulty in walking, not elsewhere classified: Secondary | ICD-10-CM | POA: Diagnosis not present

## 2017-06-04 DIAGNOSIS — I639 Cerebral infarction, unspecified: Secondary | ICD-10-CM | POA: Diagnosis not present

## 2017-06-04 DIAGNOSIS — R262 Difficulty in walking, not elsewhere classified: Secondary | ICD-10-CM | POA: Diagnosis not present

## 2017-06-05 DIAGNOSIS — I639 Cerebral infarction, unspecified: Secondary | ICD-10-CM | POA: Diagnosis not present

## 2017-06-05 DIAGNOSIS — R262 Difficulty in walking, not elsewhere classified: Secondary | ICD-10-CM | POA: Diagnosis not present

## 2017-06-07 DIAGNOSIS — R262 Difficulty in walking, not elsewhere classified: Secondary | ICD-10-CM | POA: Diagnosis not present

## 2017-06-07 DIAGNOSIS — I639 Cerebral infarction, unspecified: Secondary | ICD-10-CM | POA: Diagnosis not present

## 2017-06-08 DIAGNOSIS — I639 Cerebral infarction, unspecified: Secondary | ICD-10-CM | POA: Diagnosis not present

## 2017-06-08 DIAGNOSIS — R262 Difficulty in walking, not elsewhere classified: Secondary | ICD-10-CM | POA: Diagnosis not present

## 2017-06-09 DIAGNOSIS — G4700 Insomnia, unspecified: Secondary | ICD-10-CM | POA: Diagnosis not present

## 2017-06-09 DIAGNOSIS — F329 Major depressive disorder, single episode, unspecified: Secondary | ICD-10-CM | POA: Diagnosis not present

## 2017-06-09 DIAGNOSIS — F411 Generalized anxiety disorder: Secondary | ICD-10-CM | POA: Diagnosis not present

## 2017-06-09 DIAGNOSIS — F0391 Unspecified dementia with behavioral disturbance: Secondary | ICD-10-CM | POA: Diagnosis not present

## 2017-06-10 DIAGNOSIS — R262 Difficulty in walking, not elsewhere classified: Secondary | ICD-10-CM | POA: Diagnosis not present

## 2017-06-10 DIAGNOSIS — I639 Cerebral infarction, unspecified: Secondary | ICD-10-CM | POA: Diagnosis not present

## 2017-06-13 DIAGNOSIS — I639 Cerebral infarction, unspecified: Secondary | ICD-10-CM | POA: Diagnosis not present

## 2017-06-13 DIAGNOSIS — R262 Difficulty in walking, not elsewhere classified: Secondary | ICD-10-CM | POA: Diagnosis not present

## 2017-06-14 DIAGNOSIS — I639 Cerebral infarction, unspecified: Secondary | ICD-10-CM | POA: Diagnosis not present

## 2017-06-14 DIAGNOSIS — R262 Difficulty in walking, not elsewhere classified: Secondary | ICD-10-CM | POA: Diagnosis not present

## 2017-06-15 DIAGNOSIS — I639 Cerebral infarction, unspecified: Secondary | ICD-10-CM | POA: Diagnosis not present

## 2017-06-15 DIAGNOSIS — R262 Difficulty in walking, not elsewhere classified: Secondary | ICD-10-CM | POA: Diagnosis not present

## 2017-06-16 DIAGNOSIS — I639 Cerebral infarction, unspecified: Secondary | ICD-10-CM | POA: Diagnosis not present

## 2017-06-16 DIAGNOSIS — R262 Difficulty in walking, not elsewhere classified: Secondary | ICD-10-CM | POA: Diagnosis not present

## 2017-06-17 DIAGNOSIS — I639 Cerebral infarction, unspecified: Secondary | ICD-10-CM | POA: Diagnosis not present

## 2017-06-17 DIAGNOSIS — R262 Difficulty in walking, not elsewhere classified: Secondary | ICD-10-CM | POA: Diagnosis not present

## 2017-06-20 DIAGNOSIS — I639 Cerebral infarction, unspecified: Secondary | ICD-10-CM | POA: Diagnosis not present

## 2017-06-20 DIAGNOSIS — R262 Difficulty in walking, not elsewhere classified: Secondary | ICD-10-CM | POA: Diagnosis not present

## 2017-06-21 DIAGNOSIS — I639 Cerebral infarction, unspecified: Secondary | ICD-10-CM | POA: Diagnosis not present

## 2017-06-21 DIAGNOSIS — R262 Difficulty in walking, not elsewhere classified: Secondary | ICD-10-CM | POA: Diagnosis not present

## 2017-06-22 DIAGNOSIS — I639 Cerebral infarction, unspecified: Secondary | ICD-10-CM | POA: Diagnosis not present

## 2017-06-22 DIAGNOSIS — R262 Difficulty in walking, not elsewhere classified: Secondary | ICD-10-CM | POA: Diagnosis not present

## 2017-06-23 DIAGNOSIS — I2510 Atherosclerotic heart disease of native coronary artery without angina pectoris: Secondary | ICD-10-CM | POA: Diagnosis not present

## 2017-06-23 DIAGNOSIS — N400 Enlarged prostate without lower urinary tract symptoms: Secondary | ICD-10-CM | POA: Diagnosis not present

## 2017-06-23 DIAGNOSIS — I639 Cerebral infarction, unspecified: Secondary | ICD-10-CM | POA: Diagnosis not present

## 2017-06-23 DIAGNOSIS — R2689 Other abnormalities of gait and mobility: Secondary | ICD-10-CM | POA: Diagnosis not present

## 2017-06-23 DIAGNOSIS — F0391 Unspecified dementia with behavioral disturbance: Secondary | ICD-10-CM | POA: Diagnosis not present

## 2017-06-23 DIAGNOSIS — I10 Essential (primary) hypertension: Secondary | ICD-10-CM | POA: Diagnosis not present

## 2017-07-07 DIAGNOSIS — F0390 Unspecified dementia without behavioral disturbance: Secondary | ICD-10-CM | POA: Diagnosis not present

## 2017-07-07 DIAGNOSIS — F329 Major depressive disorder, single episode, unspecified: Secondary | ICD-10-CM | POA: Diagnosis not present

## 2017-07-07 DIAGNOSIS — F419 Anxiety disorder, unspecified: Secondary | ICD-10-CM | POA: Diagnosis not present

## 2017-07-07 DIAGNOSIS — G4700 Insomnia, unspecified: Secondary | ICD-10-CM | POA: Diagnosis not present

## 2017-07-28 DIAGNOSIS — I639 Cerebral infarction, unspecified: Secondary | ICD-10-CM | POA: Diagnosis not present

## 2017-07-28 DIAGNOSIS — I10 Essential (primary) hypertension: Secondary | ICD-10-CM | POA: Diagnosis not present

## 2017-07-28 DIAGNOSIS — F0391 Unspecified dementia with behavioral disturbance: Secondary | ICD-10-CM | POA: Diagnosis not present

## 2017-07-28 DIAGNOSIS — I2510 Atherosclerotic heart disease of native coronary artery without angina pectoris: Secondary | ICD-10-CM | POA: Diagnosis not present

## 2017-07-28 DIAGNOSIS — N400 Enlarged prostate without lower urinary tract symptoms: Secondary | ICD-10-CM | POA: Diagnosis not present

## 2017-08-01 DIAGNOSIS — I639 Cerebral infarction, unspecified: Secondary | ICD-10-CM | POA: Diagnosis not present

## 2017-08-01 DIAGNOSIS — R278 Other lack of coordination: Secondary | ICD-10-CM | POA: Diagnosis not present

## 2017-08-01 DIAGNOSIS — R1311 Dysphagia, oral phase: Secondary | ICD-10-CM | POA: Diagnosis not present

## 2017-08-02 DIAGNOSIS — R278 Other lack of coordination: Secondary | ICD-10-CM | POA: Diagnosis not present

## 2017-08-02 DIAGNOSIS — R1311 Dysphagia, oral phase: Secondary | ICD-10-CM | POA: Diagnosis not present

## 2017-08-02 DIAGNOSIS — I639 Cerebral infarction, unspecified: Secondary | ICD-10-CM | POA: Diagnosis not present

## 2017-08-03 DIAGNOSIS — R1311 Dysphagia, oral phase: Secondary | ICD-10-CM | POA: Diagnosis not present

## 2017-08-03 DIAGNOSIS — I639 Cerebral infarction, unspecified: Secondary | ICD-10-CM | POA: Diagnosis not present

## 2017-08-03 DIAGNOSIS — R278 Other lack of coordination: Secondary | ICD-10-CM | POA: Diagnosis not present

## 2017-08-04 DIAGNOSIS — I639 Cerebral infarction, unspecified: Secondary | ICD-10-CM | POA: Diagnosis not present

## 2017-08-04 DIAGNOSIS — R1311 Dysphagia, oral phase: Secondary | ICD-10-CM | POA: Diagnosis not present

## 2017-08-04 DIAGNOSIS — R278 Other lack of coordination: Secondary | ICD-10-CM | POA: Diagnosis not present

## 2017-08-05 DIAGNOSIS — R1311 Dysphagia, oral phase: Secondary | ICD-10-CM | POA: Diagnosis not present

## 2017-08-05 DIAGNOSIS — R278 Other lack of coordination: Secondary | ICD-10-CM | POA: Diagnosis not present

## 2017-08-05 DIAGNOSIS — I639 Cerebral infarction, unspecified: Secondary | ICD-10-CM | POA: Diagnosis not present

## 2017-08-08 DIAGNOSIS — R1311 Dysphagia, oral phase: Secondary | ICD-10-CM | POA: Diagnosis not present

## 2017-08-08 DIAGNOSIS — R278 Other lack of coordination: Secondary | ICD-10-CM | POA: Diagnosis not present

## 2017-08-08 DIAGNOSIS — I639 Cerebral infarction, unspecified: Secondary | ICD-10-CM | POA: Diagnosis not present

## 2017-08-09 DIAGNOSIS — R278 Other lack of coordination: Secondary | ICD-10-CM | POA: Diagnosis not present

## 2017-08-09 DIAGNOSIS — I639 Cerebral infarction, unspecified: Secondary | ICD-10-CM | POA: Diagnosis not present

## 2017-08-09 DIAGNOSIS — R1311 Dysphagia, oral phase: Secondary | ICD-10-CM | POA: Diagnosis not present

## 2017-08-10 DIAGNOSIS — I639 Cerebral infarction, unspecified: Secondary | ICD-10-CM | POA: Diagnosis not present

## 2017-08-10 DIAGNOSIS — R1311 Dysphagia, oral phase: Secondary | ICD-10-CM | POA: Diagnosis not present

## 2017-08-10 DIAGNOSIS — R278 Other lack of coordination: Secondary | ICD-10-CM | POA: Diagnosis not present

## 2017-08-11 DIAGNOSIS — I639 Cerebral infarction, unspecified: Secondary | ICD-10-CM | POA: Diagnosis not present

## 2017-08-11 DIAGNOSIS — R278 Other lack of coordination: Secondary | ICD-10-CM | POA: Diagnosis not present

## 2017-08-11 DIAGNOSIS — R1311 Dysphagia, oral phase: Secondary | ICD-10-CM | POA: Diagnosis not present

## 2017-08-12 DIAGNOSIS — R278 Other lack of coordination: Secondary | ICD-10-CM | POA: Diagnosis not present

## 2017-08-12 DIAGNOSIS — R1311 Dysphagia, oral phase: Secondary | ICD-10-CM | POA: Diagnosis not present

## 2017-08-12 DIAGNOSIS — I639 Cerebral infarction, unspecified: Secondary | ICD-10-CM | POA: Diagnosis not present

## 2017-08-15 DIAGNOSIS — R1311 Dysphagia, oral phase: Secondary | ICD-10-CM | POA: Diagnosis not present

## 2017-08-15 DIAGNOSIS — I639 Cerebral infarction, unspecified: Secondary | ICD-10-CM | POA: Diagnosis not present

## 2017-08-15 DIAGNOSIS — R278 Other lack of coordination: Secondary | ICD-10-CM | POA: Diagnosis not present

## 2017-08-16 DIAGNOSIS — R1311 Dysphagia, oral phase: Secondary | ICD-10-CM | POA: Diagnosis not present

## 2017-08-16 DIAGNOSIS — I639 Cerebral infarction, unspecified: Secondary | ICD-10-CM | POA: Diagnosis not present

## 2017-08-16 DIAGNOSIS — R278 Other lack of coordination: Secondary | ICD-10-CM | POA: Diagnosis not present

## 2017-08-17 DIAGNOSIS — R278 Other lack of coordination: Secondary | ICD-10-CM | POA: Diagnosis not present

## 2017-08-17 DIAGNOSIS — R1311 Dysphagia, oral phase: Secondary | ICD-10-CM | POA: Diagnosis not present

## 2017-08-17 DIAGNOSIS — I639 Cerebral infarction, unspecified: Secondary | ICD-10-CM | POA: Diagnosis not present

## 2017-08-18 DIAGNOSIS — R278 Other lack of coordination: Secondary | ICD-10-CM | POA: Diagnosis not present

## 2017-08-18 DIAGNOSIS — I639 Cerebral infarction, unspecified: Secondary | ICD-10-CM | POA: Diagnosis not present

## 2017-08-18 DIAGNOSIS — R1311 Dysphagia, oral phase: Secondary | ICD-10-CM | POA: Diagnosis not present

## 2017-08-19 DIAGNOSIS — I639 Cerebral infarction, unspecified: Secondary | ICD-10-CM | POA: Diagnosis not present

## 2017-08-19 DIAGNOSIS — R278 Other lack of coordination: Secondary | ICD-10-CM | POA: Diagnosis not present

## 2017-08-19 DIAGNOSIS — R1311 Dysphagia, oral phase: Secondary | ICD-10-CM | POA: Diagnosis not present

## 2017-08-22 DIAGNOSIS — R062 Wheezing: Secondary | ICD-10-CM | POA: Diagnosis not present

## 2017-08-22 DIAGNOSIS — R278 Other lack of coordination: Secondary | ICD-10-CM | POA: Diagnosis not present

## 2017-08-22 DIAGNOSIS — M79621 Pain in right upper arm: Secondary | ICD-10-CM | POA: Diagnosis not present

## 2017-08-22 DIAGNOSIS — M25511 Pain in right shoulder: Secondary | ICD-10-CM | POA: Diagnosis not present

## 2017-08-22 DIAGNOSIS — R1311 Dysphagia, oral phase: Secondary | ICD-10-CM | POA: Diagnosis not present

## 2017-08-22 DIAGNOSIS — I639 Cerebral infarction, unspecified: Secondary | ICD-10-CM | POA: Diagnosis not present

## 2017-08-23 DIAGNOSIS — R262 Difficulty in walking, not elsewhere classified: Secondary | ICD-10-CM | POA: Diagnosis not present

## 2017-08-23 DIAGNOSIS — R278 Other lack of coordination: Secondary | ICD-10-CM | POA: Diagnosis not present

## 2017-08-23 DIAGNOSIS — B351 Tinea unguium: Secondary | ICD-10-CM | POA: Diagnosis not present

## 2017-08-23 DIAGNOSIS — I639 Cerebral infarction, unspecified: Secondary | ICD-10-CM | POA: Diagnosis not present

## 2017-08-23 DIAGNOSIS — I739 Peripheral vascular disease, unspecified: Secondary | ICD-10-CM | POA: Diagnosis not present

## 2017-08-23 DIAGNOSIS — R1311 Dysphagia, oral phase: Secondary | ICD-10-CM | POA: Diagnosis not present

## 2017-08-24 DIAGNOSIS — R278 Other lack of coordination: Secondary | ICD-10-CM | POA: Diagnosis not present

## 2017-08-24 DIAGNOSIS — I639 Cerebral infarction, unspecified: Secondary | ICD-10-CM | POA: Diagnosis not present

## 2017-08-24 DIAGNOSIS — R1311 Dysphagia, oral phase: Secondary | ICD-10-CM | POA: Diagnosis not present

## 2017-08-25 DIAGNOSIS — R1311 Dysphagia, oral phase: Secondary | ICD-10-CM | POA: Diagnosis not present

## 2017-08-25 DIAGNOSIS — I639 Cerebral infarction, unspecified: Secondary | ICD-10-CM | POA: Diagnosis not present

## 2017-08-25 DIAGNOSIS — R278 Other lack of coordination: Secondary | ICD-10-CM | POA: Diagnosis not present

## 2017-08-26 DIAGNOSIS — R1311 Dysphagia, oral phase: Secondary | ICD-10-CM | POA: Diagnosis not present

## 2017-08-26 DIAGNOSIS — I639 Cerebral infarction, unspecified: Secondary | ICD-10-CM | POA: Diagnosis not present

## 2017-08-26 DIAGNOSIS — R278 Other lack of coordination: Secondary | ICD-10-CM | POA: Diagnosis not present

## 2017-08-29 DIAGNOSIS — R1311 Dysphagia, oral phase: Secondary | ICD-10-CM | POA: Diagnosis not present

## 2017-08-29 DIAGNOSIS — R278 Other lack of coordination: Secondary | ICD-10-CM | POA: Diagnosis not present

## 2017-08-29 DIAGNOSIS — I639 Cerebral infarction, unspecified: Secondary | ICD-10-CM | POA: Diagnosis not present

## 2017-08-30 DIAGNOSIS — I639 Cerebral infarction, unspecified: Secondary | ICD-10-CM | POA: Diagnosis not present

## 2017-08-30 DIAGNOSIS — R1311 Dysphagia, oral phase: Secondary | ICD-10-CM | POA: Diagnosis not present

## 2017-08-30 DIAGNOSIS — R278 Other lack of coordination: Secondary | ICD-10-CM | POA: Diagnosis not present

## 2017-08-31 DIAGNOSIS — R1311 Dysphagia, oral phase: Secondary | ICD-10-CM | POA: Diagnosis not present

## 2017-08-31 DIAGNOSIS — R278 Other lack of coordination: Secondary | ICD-10-CM | POA: Diagnosis not present

## 2017-08-31 DIAGNOSIS — I639 Cerebral infarction, unspecified: Secondary | ICD-10-CM | POA: Diagnosis not present

## 2017-09-01 DIAGNOSIS — R278 Other lack of coordination: Secondary | ICD-10-CM | POA: Diagnosis not present

## 2017-09-01 DIAGNOSIS — I639 Cerebral infarction, unspecified: Secondary | ICD-10-CM | POA: Diagnosis not present

## 2017-09-01 DIAGNOSIS — R1311 Dysphagia, oral phase: Secondary | ICD-10-CM | POA: Diagnosis not present

## 2017-09-02 DIAGNOSIS — R1311 Dysphagia, oral phase: Secondary | ICD-10-CM | POA: Diagnosis not present

## 2017-09-02 DIAGNOSIS — R278 Other lack of coordination: Secondary | ICD-10-CM | POA: Diagnosis not present

## 2017-09-02 DIAGNOSIS — I639 Cerebral infarction, unspecified: Secondary | ICD-10-CM | POA: Diagnosis not present

## 2017-09-05 DIAGNOSIS — R0989 Other specified symptoms and signs involving the circulatory and respiratory systems: Secondary | ICD-10-CM | POA: Diagnosis not present

## 2017-09-05 DIAGNOSIS — R278 Other lack of coordination: Secondary | ICD-10-CM | POA: Diagnosis not present

## 2017-09-05 DIAGNOSIS — R1311 Dysphagia, oral phase: Secondary | ICD-10-CM | POA: Diagnosis not present

## 2017-09-05 DIAGNOSIS — R062 Wheezing: Secondary | ICD-10-CM | POA: Diagnosis not present

## 2017-09-05 DIAGNOSIS — I639 Cerebral infarction, unspecified: Secondary | ICD-10-CM | POA: Diagnosis not present

## 2017-09-06 DIAGNOSIS — I10 Essential (primary) hypertension: Secondary | ICD-10-CM | POA: Diagnosis not present

## 2017-09-08 DIAGNOSIS — R1311 Dysphagia, oral phase: Secondary | ICD-10-CM | POA: Diagnosis not present

## 2017-09-08 DIAGNOSIS — I639 Cerebral infarction, unspecified: Secondary | ICD-10-CM | POA: Diagnosis not present

## 2017-09-08 DIAGNOSIS — R278 Other lack of coordination: Secondary | ICD-10-CM | POA: Diagnosis not present

## 2017-09-09 DIAGNOSIS — R1311 Dysphagia, oral phase: Secondary | ICD-10-CM | POA: Diagnosis not present

## 2017-09-09 DIAGNOSIS — I639 Cerebral infarction, unspecified: Secondary | ICD-10-CM | POA: Diagnosis not present

## 2017-09-09 DIAGNOSIS — R278 Other lack of coordination: Secondary | ICD-10-CM | POA: Diagnosis not present

## 2017-09-10 DIAGNOSIS — I639 Cerebral infarction, unspecified: Secondary | ICD-10-CM | POA: Diagnosis not present

## 2017-09-10 DIAGNOSIS — R1311 Dysphagia, oral phase: Secondary | ICD-10-CM | POA: Diagnosis not present

## 2017-09-10 DIAGNOSIS — R278 Other lack of coordination: Secondary | ICD-10-CM | POA: Diagnosis not present

## 2017-09-13 DIAGNOSIS — R1311 Dysphagia, oral phase: Secondary | ICD-10-CM | POA: Diagnosis not present

## 2017-09-13 DIAGNOSIS — I639 Cerebral infarction, unspecified: Secondary | ICD-10-CM | POA: Diagnosis not present

## 2017-09-13 DIAGNOSIS — R278 Other lack of coordination: Secondary | ICD-10-CM | POA: Diagnosis not present

## 2017-09-14 DIAGNOSIS — R1311 Dysphagia, oral phase: Secondary | ICD-10-CM | POA: Diagnosis not present

## 2017-09-14 DIAGNOSIS — R278 Other lack of coordination: Secondary | ICD-10-CM | POA: Diagnosis not present

## 2017-09-14 DIAGNOSIS — I10 Essential (primary) hypertension: Secondary | ICD-10-CM | POA: Diagnosis not present

## 2017-09-14 DIAGNOSIS — I639 Cerebral infarction, unspecified: Secondary | ICD-10-CM | POA: Diagnosis not present

## 2017-09-15 DIAGNOSIS — F329 Major depressive disorder, single episode, unspecified: Secondary | ICD-10-CM | POA: Diagnosis not present

## 2017-09-15 DIAGNOSIS — F0150 Vascular dementia without behavioral disturbance: Secondary | ICD-10-CM | POA: Diagnosis not present

## 2017-09-15 DIAGNOSIS — I10 Essential (primary) hypertension: Secondary | ICD-10-CM | POA: Diagnosis not present

## 2017-09-15 DIAGNOSIS — G4700 Insomnia, unspecified: Secondary | ICD-10-CM | POA: Diagnosis not present

## 2017-09-15 DIAGNOSIS — R278 Other lack of coordination: Secondary | ICD-10-CM | POA: Diagnosis not present

## 2017-09-15 DIAGNOSIS — I639 Cerebral infarction, unspecified: Secondary | ICD-10-CM | POA: Diagnosis not present

## 2017-09-15 DIAGNOSIS — R1311 Dysphagia, oral phase: Secondary | ICD-10-CM | POA: Diagnosis not present

## 2017-09-15 DIAGNOSIS — F419 Anxiety disorder, unspecified: Secondary | ICD-10-CM | POA: Diagnosis not present

## 2017-09-22 DIAGNOSIS — N400 Enlarged prostate without lower urinary tract symptoms: Secondary | ICD-10-CM | POA: Diagnosis not present

## 2017-09-22 DIAGNOSIS — F0391 Unspecified dementia with behavioral disturbance: Secondary | ICD-10-CM | POA: Diagnosis not present

## 2017-09-22 DIAGNOSIS — F322 Major depressive disorder, single episode, severe without psychotic features: Secondary | ICD-10-CM | POA: Diagnosis not present

## 2017-09-22 DIAGNOSIS — J449 Chronic obstructive pulmonary disease, unspecified: Secondary | ICD-10-CM | POA: Diagnosis not present

## 2017-09-22 DIAGNOSIS — I2510 Atherosclerotic heart disease of native coronary artery without angina pectoris: Secondary | ICD-10-CM | POA: Diagnosis not present

## 2017-10-11 DIAGNOSIS — F0390 Unspecified dementia without behavioral disturbance: Secondary | ICD-10-CM | POA: Diagnosis not present

## 2017-10-11 DIAGNOSIS — H04123 Dry eye syndrome of bilateral lacrimal glands: Secondary | ICD-10-CM | POA: Diagnosis not present

## 2017-10-11 DIAGNOSIS — H43813 Vitreous degeneration, bilateral: Secondary | ICD-10-CM | POA: Diagnosis not present

## 2017-10-11 DIAGNOSIS — H25813 Combined forms of age-related cataract, bilateral: Secondary | ICD-10-CM | POA: Diagnosis not present

## 2017-10-27 DIAGNOSIS — R2689 Other abnormalities of gait and mobility: Secondary | ICD-10-CM | POA: Diagnosis not present

## 2017-10-27 DIAGNOSIS — I2510 Atherosclerotic heart disease of native coronary artery without angina pectoris: Secondary | ICD-10-CM | POA: Diagnosis not present

## 2017-10-27 DIAGNOSIS — I10 Essential (primary) hypertension: Secondary | ICD-10-CM | POA: Diagnosis not present

## 2017-10-27 DIAGNOSIS — J449 Chronic obstructive pulmonary disease, unspecified: Secondary | ICD-10-CM | POA: Diagnosis not present

## 2017-10-27 DIAGNOSIS — N400 Enlarged prostate without lower urinary tract symptoms: Secondary | ICD-10-CM | POA: Diagnosis not present

## 2017-10-27 DIAGNOSIS — F0390 Unspecified dementia without behavioral disturbance: Secondary | ICD-10-CM | POA: Diagnosis not present

## 2017-11-01 DIAGNOSIS — I739 Peripheral vascular disease, unspecified: Secondary | ICD-10-CM | POA: Diagnosis not present

## 2017-11-01 DIAGNOSIS — B351 Tinea unguium: Secondary | ICD-10-CM | POA: Diagnosis not present

## 2017-11-01 DIAGNOSIS — R262 Difficulty in walking, not elsewhere classified: Secondary | ICD-10-CM | POA: Diagnosis not present

## 2017-11-02 DIAGNOSIS — F329 Major depressive disorder, single episode, unspecified: Secondary | ICD-10-CM | POA: Diagnosis not present

## 2017-11-02 DIAGNOSIS — F0391 Unspecified dementia with behavioral disturbance: Secondary | ICD-10-CM | POA: Diagnosis not present

## 2017-11-02 DIAGNOSIS — F0150 Vascular dementia without behavioral disturbance: Secondary | ICD-10-CM | POA: Diagnosis not present

## 2017-11-02 DIAGNOSIS — F419 Anxiety disorder, unspecified: Secondary | ICD-10-CM | POA: Diagnosis not present

## 2017-11-08 DIAGNOSIS — I10 Essential (primary) hypertension: Secondary | ICD-10-CM | POA: Diagnosis not present

## 2017-11-16 DIAGNOSIS — R2681 Unsteadiness on feet: Secondary | ICD-10-CM | POA: Diagnosis not present

## 2017-11-18 DIAGNOSIS — R2681 Unsteadiness on feet: Secondary | ICD-10-CM | POA: Diagnosis not present

## 2017-11-19 DIAGNOSIS — R2681 Unsteadiness on feet: Secondary | ICD-10-CM | POA: Diagnosis not present

## 2017-11-21 DIAGNOSIS — I10 Essential (primary) hypertension: Secondary | ICD-10-CM | POA: Diagnosis not present

## 2017-11-21 DIAGNOSIS — R2681 Unsteadiness on feet: Secondary | ICD-10-CM | POA: Diagnosis not present

## 2017-11-22 DIAGNOSIS — R2681 Unsteadiness on feet: Secondary | ICD-10-CM | POA: Diagnosis not present

## 2017-11-23 DIAGNOSIS — R2681 Unsteadiness on feet: Secondary | ICD-10-CM | POA: Diagnosis not present

## 2017-11-24 DIAGNOSIS — R2681 Unsteadiness on feet: Secondary | ICD-10-CM | POA: Diagnosis not present

## 2017-11-24 DIAGNOSIS — I2510 Atherosclerotic heart disease of native coronary artery without angina pectoris: Secondary | ICD-10-CM | POA: Diagnosis not present

## 2017-11-24 DIAGNOSIS — I639 Cerebral infarction, unspecified: Secondary | ICD-10-CM | POA: Diagnosis not present

## 2017-11-24 DIAGNOSIS — F0390 Unspecified dementia without behavioral disturbance: Secondary | ICD-10-CM | POA: Diagnosis not present

## 2017-11-24 DIAGNOSIS — I10 Essential (primary) hypertension: Secondary | ICD-10-CM | POA: Diagnosis not present

## 2017-11-24 DIAGNOSIS — J449 Chronic obstructive pulmonary disease, unspecified: Secondary | ICD-10-CM | POA: Diagnosis not present

## 2017-11-25 DIAGNOSIS — R2681 Unsteadiness on feet: Secondary | ICD-10-CM | POA: Diagnosis not present

## 2017-11-28 DIAGNOSIS — R2681 Unsteadiness on feet: Secondary | ICD-10-CM | POA: Diagnosis not present

## 2017-11-29 DIAGNOSIS — R2681 Unsteadiness on feet: Secondary | ICD-10-CM | POA: Diagnosis not present

## 2017-11-30 DIAGNOSIS — R2681 Unsteadiness on feet: Secondary | ICD-10-CM | POA: Diagnosis not present

## 2017-12-01 DIAGNOSIS — R2681 Unsteadiness on feet: Secondary | ICD-10-CM | POA: Diagnosis not present

## 2017-12-02 DIAGNOSIS — R2681 Unsteadiness on feet: Secondary | ICD-10-CM | POA: Diagnosis not present

## 2017-12-05 DIAGNOSIS — R2681 Unsteadiness on feet: Secondary | ICD-10-CM | POA: Diagnosis not present

## 2017-12-06 DIAGNOSIS — R2681 Unsteadiness on feet: Secondary | ICD-10-CM | POA: Diagnosis not present

## 2017-12-07 DIAGNOSIS — R2681 Unsteadiness on feet: Secondary | ICD-10-CM | POA: Diagnosis not present

## 2017-12-09 DIAGNOSIS — R2681 Unsteadiness on feet: Secondary | ICD-10-CM | POA: Diagnosis not present

## 2017-12-11 DIAGNOSIS — R2681 Unsteadiness on feet: Secondary | ICD-10-CM | POA: Diagnosis not present

## 2017-12-12 DIAGNOSIS — R2681 Unsteadiness on feet: Secondary | ICD-10-CM | POA: Diagnosis not present

## 2017-12-13 DIAGNOSIS — R2681 Unsteadiness on feet: Secondary | ICD-10-CM | POA: Diagnosis not present

## 2017-12-14 DIAGNOSIS — R2681 Unsteadiness on feet: Secondary | ICD-10-CM | POA: Diagnosis not present

## 2017-12-14 DIAGNOSIS — E119 Type 2 diabetes mellitus without complications: Secondary | ICD-10-CM | POA: Diagnosis not present

## 2017-12-29 DIAGNOSIS — F0390 Unspecified dementia without behavioral disturbance: Secondary | ICD-10-CM | POA: Diagnosis not present

## 2017-12-29 DIAGNOSIS — I10 Essential (primary) hypertension: Secondary | ICD-10-CM | POA: Diagnosis not present

## 2017-12-29 DIAGNOSIS — J449 Chronic obstructive pulmonary disease, unspecified: Secondary | ICD-10-CM | POA: Diagnosis not present

## 2017-12-29 DIAGNOSIS — I639 Cerebral infarction, unspecified: Secondary | ICD-10-CM | POA: Diagnosis not present

## 2017-12-29 DIAGNOSIS — I2510 Atherosclerotic heart disease of native coronary artery without angina pectoris: Secondary | ICD-10-CM | POA: Diagnosis not present

## 2018-01-05 DIAGNOSIS — G4700 Insomnia, unspecified: Secondary | ICD-10-CM | POA: Diagnosis not present

## 2018-01-05 DIAGNOSIS — F0391 Unspecified dementia with behavioral disturbance: Secondary | ICD-10-CM | POA: Diagnosis not present

## 2018-01-05 DIAGNOSIS — F411 Generalized anxiety disorder: Secondary | ICD-10-CM | POA: Diagnosis not present

## 2018-01-05 DIAGNOSIS — F329 Major depressive disorder, single episode, unspecified: Secondary | ICD-10-CM | POA: Diagnosis not present

## 2018-01-17 DIAGNOSIS — I10 Essential (primary) hypertension: Secondary | ICD-10-CM | POA: Diagnosis not present

## 2018-01-17 DIAGNOSIS — E119 Type 2 diabetes mellitus without complications: Secondary | ICD-10-CM | POA: Diagnosis not present

## 2018-03-27 DIAGNOSIS — N390 Urinary tract infection, site not specified: Secondary | ICD-10-CM | POA: Diagnosis not present

## 2018-05-08 DIAGNOSIS — I10 Essential (primary) hypertension: Secondary | ICD-10-CM | POA: Diagnosis not present

## 2018-05-27 ENCOUNTER — Encounter (HOSPITAL_COMMUNITY): Payer: Self-pay | Admitting: Emergency Medicine

## 2018-05-27 ENCOUNTER — Emergency Department (HOSPITAL_COMMUNITY)
Admission: EM | Admit: 2018-05-27 | Discharge: 2018-05-27 | Disposition: A | Discharge status: Home / Self Care | Payer: Medicare Other | Attending: Emergency Medicine | Admitting: Emergency Medicine

## 2018-05-27 ENCOUNTER — Other Ambulatory Visit: Payer: Self-pay

## 2018-05-27 DIAGNOSIS — W06XXXA Fall from bed, initial encounter: Secondary | ICD-10-CM | POA: Diagnosis not present

## 2018-05-27 DIAGNOSIS — Z79899 Other long term (current) drug therapy: Secondary | ICD-10-CM | POA: Insufficient documentation

## 2018-05-27 DIAGNOSIS — Y9384 Activity, sleeping: Secondary | ICD-10-CM | POA: Diagnosis not present

## 2018-05-27 DIAGNOSIS — Z8673 Personal history of transient ischemic attack (TIA), and cerebral infarction without residual deficits: Secondary | ICD-10-CM | POA: Insufficient documentation

## 2018-05-27 DIAGNOSIS — Z7902 Long term (current) use of antithrombotics/antiplatelets: Secondary | ICD-10-CM | POA: Insufficient documentation

## 2018-05-27 DIAGNOSIS — Z7982 Long term (current) use of aspirin: Secondary | ICD-10-CM | POA: Insufficient documentation

## 2018-05-27 DIAGNOSIS — Z043 Encounter for examination and observation following other accident: Secondary | ICD-10-CM | POA: Insufficient documentation

## 2018-05-27 DIAGNOSIS — I2510 Atherosclerotic heart disease of native coronary artery without angina pectoris: Secondary | ICD-10-CM | POA: Diagnosis not present

## 2018-05-27 DIAGNOSIS — I10 Essential (primary) hypertension: Secondary | ICD-10-CM | POA: Diagnosis not present

## 2018-05-27 DIAGNOSIS — Y999 Unspecified external cause status: Secondary | ICD-10-CM | POA: Diagnosis not present

## 2018-05-27 DIAGNOSIS — Y92122 Bedroom in nursing home as the place of occurrence of the external cause: Secondary | ICD-10-CM | POA: Insufficient documentation

## 2018-05-27 DIAGNOSIS — Z0000 Encounter for general adult medical examination without abnormal findings: Secondary | ICD-10-CM

## 2018-05-27 DIAGNOSIS — Z87891 Personal history of nicotine dependence: Secondary | ICD-10-CM | POA: Insufficient documentation

## 2018-05-27 DIAGNOSIS — W010XXA Fall on same level from slipping, tripping and stumbling without subsequent striking against object, initial encounter: Secondary | ICD-10-CM

## 2018-05-27 DIAGNOSIS — Z951 Presence of aortocoronary bypass graft: Secondary | ICD-10-CM | POA: Insufficient documentation

## 2018-05-27 NOTE — ED Notes (Signed)
Pt is oriented to self and birthday only. This is pt's baseline per Ems per facility.

## 2018-05-27 NOTE — ED Triage Notes (Signed)
Patient in here via EMS from [facility] with complaints of fall from bed, tangled up in bed, head on floor. Blood thinners. No complaints, no pain.

## 2018-05-27 NOTE — ED Notes (Signed)
Pt has no complaints to this RN.

## 2018-05-27 NOTE — ED Notes (Signed)
Bed: [id number] Expected date:  Expected time:  Means of arrival:  Comments: Fall on thinners

## 2018-05-27 NOTE — Discharge Instructions (Addendum)
It was our pleasure to provide your ER care today - we hope that you feel better.  Fall precautions.  Return to ER if worse, new symptoms, new or severe pain, change in mental status, fevers, persistent vomiting, other concern.

## 2018-05-27 NOTE — ED Notes (Signed)
Transport called.

## 2018-05-27 NOTE — ED Notes (Signed)
Bed: WHALD Expected date:  Expected time:  Means of arrival:  Comments: 

## 2018-05-27 NOTE — ED Provider Notes (Signed)
[facility]-EMERGENCY DEPT Provider Note   CSN: [id number] Arrival date & time: 05/27/18  1221     History   Chief Complaint Chief Complaint  Patient presents with  . Fall    HPI Ian [name] is a 78 y.o. male.  Patient with hx cva/dementia arrives via EMS from [facility] after mechanical fall - report that pts feet tangled in bed sheets and fell. No loc noted. Since fall, pts mental status has remained consistent with prior. Pt denies pain or injury. No headache. No neck or back pain. No nausea or vomiting. Pt limited historian/vascular dementia - level 5 caveat.   The history is provided by the patient, the EMS personnel and the nursing home. The history is limited by the condition of the patient.  Fall  Pertinent negatives include no chest pain, no abdominal pain, no headaches and no shortness of breath.    Past Medical History:  Diagnosis Date  . BPH (benign prostatic hyperplasia)   . CAD (coronary artery disease)    LIMA to OM2, SVG to OM1, SVG to RCA  . HLD (hyperlipidemia)   . HTN (hypertension)   . Stroke [facility])     Patient Active Problem List   Diagnosis Date Noted  . Syncope and collapse 07/04/2014  . Acute renal failure (HCC) 07/04/2014  . Bradycardia 07/04/2014  . Dehydration 07/04/2014  . Abnormal urinalysis 07/04/2014  . Confusion with non-focal neuro exam   . HLD (hyperlipidemia)   . 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 History of CVA (cerebrovascular accident) 06/08/14 06/08/2014  . Hypertension 06/07/2014  . Alcohol abuse 06/07/2014  . Tobacco abuse 09/17/2010  . CAD (coronary artery disease)   . BENIGN PROSTATIC HYPERTROPHY 07/17/2010    Past Surgical History:  Procedure Laterality Date  . CORONARY ARTERY BYPASS GRAFT  2001  . HERNIA REPAIR          Home Medications    Prior to Admission medications   Medication Sig Start Date End Date Taking? Authorizing Provider  acetaminophen (TYLENOL)  325 MG tablet Take 1 tablet (325 mg total) by mouth every 6 (six) hours as needed for mild pain (or Fever >/= 101). 07/07/14   Albertine Grates, MD  albuterol (PROVENTIL) (2.5 MG/3ML) 0.083% nebulizer solution Take 1 mg by nebulization every 6 (six) hours as needed. sob 02/14/15   [provider]  amLODipine (NORVASC) 5 MG tablet TAKE ONE TABLET BY MOUTH EVERY DAY 09/20/11   Kathleene Hazel, MD  aspirin EC 81 MG EC tablet Take 1 tablet (81 mg total) by mouth daily. 06/11/14   Elgergawy, Leana Roe, MD  carvedilol (COREG) 3.125 MG tablet Take 1 tablet (3.125 mg total) by mouth 2 (two) times daily with a meal. 07/08/14   Albertine Grates, MD  clopidogrel (PLAVIX) 75 MG tablet Take 1 tablet (75 mg total) by mouth daily. 06/11/14   Elgergawy, Leana Roe, MD  FLUoxetine (PROZAC) 20 MG tablet Take 1 tablet (20 mg total) by mouth daily. 08/17/10   Etta Grandchild, MD  folic acid (FOLVITE) 1 MG tablet Take 1 tablet (1 mg total) by mouth daily. 06/11/14   Elgergawy, Leana Roe, MD  guaiFENesin (MUCINEX) 600 MG 12 hr tablet Take 1 tablet (600 mg total) by mouth 2 (two) times daily. 07/08/14   Albertine Grates, MD  megestrol (MEGACE) 40 MG/ML suspension Take 1 mg by mouth daily. 02/15/15   [provider]  Multiple Vitamins-Minerals (MULTIVITAMIN) tablet Take 1 tablet  by mouth daily. 06/11/14   Elgergawy, Leana Roeawood S, MD  senna-docusate (SENOKOT-S) 8.6-50 MG per tablet Take 2 tablets by mouth at bedtime. 07/07/14   Albertine GratesXu, Fang, MD  simvastatin (ZOCOR) 40 MG tablet Take 1 tablet (40 mg total) by mouth at bedtime. 08/17/10   Etta GrandchildJones, Thomas L, MD  thiamine (VITAMIN B-1) 100 MG tablet Take 2 tablets (200 mg total) by mouth daily. 06/11/14   Elgergawy, Leana Roeawood S, MD    Family History Family History  Problem Relation Age of Onset  . Heart attack Mother   . Heart attack Father   . Arthritis Other   . Hypertension Other   . Stroke Other     Social History Social History   Tobacco Use  . Smoking status: Former Smoker    Packs/day:  0.50    Years: 40.00    Pack years: 20.00    Types: Cigarettes  . Smokeless tobacco: Never Used  Substance Use Topics  . Alcohol use: Yes    Alcohol/week: 5.0 standard drinks    Types: 5 Shots of liquor per week  . Drug use: No     Allergies   Patient has no known allergies.   Review of Systems Review of Systems  Constitutional: Negative for fever.  HENT: Negative for nosebleeds.   Eyes: Negative for redness.  Respiratory: Negative for shortness of breath.   Cardiovascular: Negative for chest pain.  Gastrointestinal: Negative for abdominal pain and vomiting.  Genitourinary: Negative for flank pain.  Musculoskeletal: Negative for back pain and neck pain.  Skin: Negative for wound.  Neurological: Negative for headaches.  Hematological: Does not bruise/bleed easily.  Psychiatric/Behavioral: The patient is not nervous/anxious.      Physical Exam Updated Vital Signs BP 103/69 (BP Location: Left Arm)   Pulse 61   Temp 98.5 F (36.9 C) (Oral)   Resp 16   Wt 86.2 kg   SpO2 100%   BMI 28.06 kg/m   Physical Exam Vitals signs and nursing note reviewed.  Constitutional:      Appearance: Normal appearance. He is well-developed.  HENT:     Head: Atraumatic.     Comments: There is no facial, head or scalp sts, bruising, contusion or tenderness noted.     Nose: Nose normal.     Mouth/Throat:     Mouth: Mucous membranes are moist.     Pharynx: Oropharynx is clear.  Eyes:     General: No scleral icterus.    Conjunctiva/sclera: Conjunctivae normal.     Pupils: Pupils are equal, round, and reactive to light.  Neck:     Musculoskeletal: Normal range of motion and neck supple. No neck rigidity or muscular tenderness.     Vascular: No carotid bruit.     Trachea: No tracheal deviation.  Cardiovascular:     Rate and Rhythm: Normal rate and regular rhythm.     Pulses: Normal pulses.     Heart sounds: Normal heart sounds. No murmur. No friction rub. No gallop.   Pulmonary:      Effort: Pulmonary effort is normal. No accessory muscle usage or respiratory distress.     Breath sounds: Normal breath sounds.  Chest:     Chest wall: No tenderness.  Abdominal:     General: Bowel sounds are normal. There is no distension.     Palpations: Abdomen is soft.     Tenderness: There is no abdominal tenderness. There is no guarding.  Genitourinary:    Comments: No cva tenderness. Musculoskeletal:  General: No swelling.     Comments: CTLS spine, non tender, aligned, no step off. Good rom bil extremities without pain or focal bony tenderness.   Skin:    General: Skin is warm and dry.     Findings: No rash.  Neurological:     Mental Status: He is alert.     Comments: Pt awake and alert. pts mental status described as being c/w baseline. Moves bil extremities.   Psychiatric:        Mood and Affect: Mood normal.      ED Treatments / Results  Labs (all labs ordered are listed, but only abnormal results are displayed) Labs Reviewed - No data to display  EKG None  Radiology No results found.  Procedures Procedures (including critical care time)  Medications Ordered in ED Medications - No data to display   Initial Impression / Assessment and Plan / ED Course  I have reviewed the triage vital signs and the nursing notes.  Pertinent labs & imaging results that were available during my care of the patient were reviewed by me and considered in my medical decision making (see chart for details).  No sign of trauma/head trauma on exam. No loc noted, and pts mental status c/w baseline. Pt denies pain, no pain w palp or rom.   Pt currently appears stable for d/c.   Reviewed nursing notes and prior charts for additional history.     Final Clinical Impressions(s) / ED Diagnoses   Final diagnoses:  None    ED Discharge Orders    None       Cathren Laine, MD 05/27/18 1253

## 2018-06-10 ENCOUNTER — Emergency Department (HOSPITAL_COMMUNITY)
Admission: EM | Admit: 2018-06-10 | Discharge: 2018-06-10 | Disposition: A | Discharge status: Home / Self Care | Payer: Medicare Other | Attending: Emergency Medicine | Admitting: Emergency Medicine

## 2018-06-10 ENCOUNTER — Emergency Department (HOSPITAL_COMMUNITY): Payer: Medicare Other

## 2018-06-10 ENCOUNTER — Encounter (HOSPITAL_COMMUNITY): Payer: Self-pay | Admitting: *Deleted

## 2018-06-10 ENCOUNTER — Other Ambulatory Visit: Payer: Self-pay

## 2018-06-10 DIAGNOSIS — M25512 Pain in left shoulder: Secondary | ICD-10-CM | POA: Diagnosis not present

## 2018-06-10 DIAGNOSIS — M542 Cervicalgia: Secondary | ICD-10-CM | POA: Insufficient documentation

## 2018-06-10 DIAGNOSIS — Z87891 Personal history of nicotine dependence: Secondary | ICD-10-CM | POA: Insufficient documentation

## 2018-06-10 DIAGNOSIS — I10 Essential (primary) hypertension: Secondary | ICD-10-CM | POA: Diagnosis not present

## 2018-06-10 DIAGNOSIS — I2510 Atherosclerotic heart disease of native coronary artery without angina pectoris: Secondary | ICD-10-CM | POA: Diagnosis not present

## 2018-06-10 DIAGNOSIS — Y939 Activity, unspecified: Secondary | ICD-10-CM | POA: Insufficient documentation

## 2018-06-10 DIAGNOSIS — Z79899 Other long term (current) drug therapy: Secondary | ICD-10-CM | POA: Diagnosis not present

## 2018-06-10 DIAGNOSIS — Z7982 Long term (current) use of aspirin: Secondary | ICD-10-CM | POA: Insufficient documentation

## 2018-06-10 DIAGNOSIS — W19XXXA Unspecified fall, initial encounter: Secondary | ICD-10-CM

## 2018-06-10 DIAGNOSIS — Y92129 Unspecified place in nursing home as the place of occurrence of the external cause: Secondary | ICD-10-CM | POA: Diagnosis not present

## 2018-06-10 DIAGNOSIS — Y999 Unspecified external cause status: Secondary | ICD-10-CM | POA: Insufficient documentation

## 2018-06-10 DIAGNOSIS — F0390 Unspecified dementia without behavioral disturbance: Secondary | ICD-10-CM | POA: Diagnosis not present

## 2018-06-10 DIAGNOSIS — S4992XA Unspecified injury of left shoulder and upper arm, initial encounter: Secondary | ICD-10-CM | POA: Diagnosis present

## 2018-06-10 DIAGNOSIS — W1830XA Fall on same level, unspecified, initial encounter: Secondary | ICD-10-CM | POA: Insufficient documentation

## 2018-06-10 LAB — CBG MONITORING, ED
GLUCOSE-CAPILLARY: 122 mg/dL — AB (ref 70–99)
Glucose-Capillary: 58 mg/dL — ABNORMAL LOW (ref 70–99)
Glucose-Capillary: 63 mg/dL — ABNORMAL LOW (ref 70–99)

## 2018-06-10 NOTE — ED Notes (Addendum)
Patient's CBG is up to 122 mg/dl [facility] staff informed, [facility] called to request transport

## 2018-06-10 NOTE — Discharge Instructions (Addendum)
Tylenol for any soreness from fall tonight. Imaging does not show any fracture injury.

## 2018-06-10 NOTE — ED Notes (Signed)
c-collar removed  Pt alert   Answers questions asked unknown if he knows what I am saying

## 2018-06-10 NOTE — ED Notes (Addendum)
Patient's CBG was 58, Patient is alert, no sign of discomfort, Orange juice given  Will continue to monitor

## 2018-06-10 NOTE — ED Provider Notes (Signed)
[facility] Provider Note   CSN: [id number] Arrival date & time: 06/10/18  [id number]     History   Chief Complaint Chief Complaint  Patient presents with  . Fall    HPI Ian [name] is a 78 y.o. male.  [facility] with history of dementia [facility], hitting left shoulder on floor. No LOC, vomiting. No change in baseline mental status. He complains of left shoulder pain only. The patient does not know what he fell. No chest, abdominal or pelivc/hip pain.   The history is provided by the patient and the EMS personnel. No language interpreter was used.  Fall  Pertinent negatives include no chest pain, no abdominal pain and no shortness of breath.    Past Medical History:  Diagnosis Date  . BPH (benign prostatic hyperplasia)   . CAD (coronary artery disease)    LIMA to OM2, SVG to OM1, SVG to RCA  . HLD (hyperlipidemia)   . HTN (hypertension)   . Stroke [facility](HCC)     Patient Active Problem List   Diagnosis Date Noted  . Syncope and collapse 07/04/2014  . Acute renal failure (HCC) 07/04/2014  . Bradycardia 07/04/2014  . Dehydration 07/04/2014  . Abnormal urinalysis 07/04/2014  . Confusion with non-focal neuro exam   . HLD (hyperlipidemia)   . 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 History of CVA (cerebrovascular accident) 06/08/14 06/08/2014  . Hypertension 06/07/2014  . Alcohol abuse 06/07/2014  . Tobacco abuse 09/17/2010  . CAD (coronary artery disease)   . BENIGN PROSTATIC HYPERTROPHY 07/17/2010    Past Surgical History:  Procedure Laterality Date  . CORONARY ARTERY BYPASS GRAFT  2001  . HERNIA REPAIR          Home Medications    Prior to Admission medications   Medication Sig Start Date End Date Taking? Authorizing Provider  acetaminophen (TYLENOL) 325 MG tablet Take 1 tablet (325 mg total) by mouth every 6 (six) hours as needed for mild pain (or Fever >/= 101).  07/07/14   Albertine GratesXu, Fang, MD  albuterol (PROVENTIL) (2.5 MG/3ML) 0.083% nebulizer solution Take 1 mg by nebulization every 6 (six) hours as needed. sob 02/14/15   [provider]  amLODipine (NORVASC) 5 MG tablet TAKE ONE TABLET BY MOUTH EVERY DAY 09/20/11   Kathleene HazelMcAlhany, Christopher D, MD  aspirin EC 81 MG EC tablet Take 1 tablet (81 mg total) by mouth daily. 06/11/14   Elgergawy, Leana Roeawood S, MD  carvedilol (COREG) 3.125 MG tablet Take 1 tablet (3.125 mg total) by mouth 2 (two) times daily with a meal. 07/08/14   Albertine GratesXu, Fang, MD  clopidogrel (PLAVIX) 75 MG tablet Take 1 tablet (75 mg total) by mouth daily. 06/11/14   Elgergawy, Leana Roeawood S, MD  FLUoxetine (PROZAC) 20 MG tablet Take 1 tablet (20 mg total) by mouth daily. 08/17/10   Etta GrandchildJones, Thomas L, MD  folic acid (FOLVITE) 1 MG tablet Take 1 tablet (1 mg total) by mouth daily. 06/11/14   Elgergawy, Leana Roeawood S, MD  guaiFENesin (MUCINEX) 600 MG 12 hr tablet Take 1 tablet (600 mg total) by mouth 2 (two) times daily. 07/08/14   Albertine GratesXu, Fang, MD  megestrol (MEGACE) 40 MG/ML suspension Take 1 mg by mouth daily. 02/15/15   [provider]  Multiple Vitamins-Minerals (MULTIVITAMIN) tablet Take 1 tablet by mouth daily. 06/11/14   Elgergawy, Leana Roeawood S, MD  senna-docusate (SENOKOT-S) 8.6-50 MG per tablet Take 2 tablets  by mouth at bedtime. 07/07/14   Albertine Grates, MD  simvastatin (ZOCOR) 40 MG tablet Take 1 tablet (40 mg total) by mouth at bedtime. 08/17/10   Etta Grandchild, MD  thiamine (VITAMIN B-1) 100 MG tablet Take 2 tablets (200 mg total) by mouth daily. 06/11/14   Elgergawy, Leana Roe, MD    Family History Family History  Problem Relation Age of Onset  . Heart attack Mother   . Heart attack Father   . Arthritis Other   . Hypertension Other   . Stroke Other     Social History Social History   Tobacco Use  . Smoking status: Former Smoker    Packs/day: 0.50    Years: 40.00    Pack years: 20.00    Types: Cigarettes  . Smokeless tobacco: Never Used  Substance Use  Topics  . Alcohol use: Yes    Alcohol/week: 5.0 standard drinks    Types: 5 Shots of liquor per week  . Drug use: No     Allergies   Patient has no known allergies.   Review of Systems Review of Systems  Constitutional: Negative for chills and fever.  HENT: Negative.   Respiratory: Negative.  Negative for shortness of breath.   Cardiovascular: Negative.  Negative for chest pain.  Gastrointestinal: Negative.  Negative for abdominal pain and vomiting.  Musculoskeletal:       See HPI.  Skin: Negative.  Negative for wound.  Neurological: Negative.  Negative for syncope.     Physical Exam Updated Vital Signs BP 125/69   Pulse 64   Temp 98.2 F (36.8 C)   Resp 20   Ht 6' (1.829 m)   Wt 86.2 kg   SpO2 99%   BMI 25.77 kg/m   Physical Exam Vitals signs and nursing note reviewed.  Constitutional:      Appearance: He is well-developed.  HENT:     Head: Atraumatic.  Neck:     Musculoskeletal: Normal range of motion.     Comments: There is mild midline cervical tenderness to palpation. Patient remains in c-collar. Cardiovascular:     Rate and Rhythm: Normal rate.  Pulmonary:     Effort: Pulmonary effort is normal.  Chest:     Chest wall: No tenderness.  Abdominal:     General: There is no distension.     Palpations: Abdomen is soft.     Tenderness: There is no abdominal tenderness.  Musculoskeletal: Normal range of motion.     Comments: Mild tenderness to mid left clavicle without deformity. No other left shoulder tenderness. Remainder extremities nontender with preserved full ROM without limitation.   Skin:    General: Skin is warm and dry.  Neurological:     General: No focal deficit present.     Mental Status: He is alert.     Sensory: No sensory deficit.     Coordination: Coordination normal.      ED Treatments / Results  Labs (all labs ordered are listed, but only abnormal results are displayed) Labs Reviewed - No data to  display  EKG None  Radiology No results found.  Procedures Procedures (including critical care time)  Medications Ordered in ED Medications - No data to display   Initial Impression / Assessment and Plan / ED Course  I have reviewed the triage vital signs and the nursing notes.  Pertinent labs & imaging results that were available during my care of the patient were reviewed by me and considered in my  medical decision making (see chart for details).     Patient to ED from [facility] after fall, complains of left shoulder pain. He has neck tenderness on exam. Otherwise no injury reported. No LOC.   Patient at baseline mental status, history of dementia. His exam is reassuring. Imaging is negative. He can be discharged back to the nursing home. Tylenol as needed.   Final Clinical Impressions(s) / ED Diagnoses   Final diagnoses:  None   1. Fall 2. Shoulder pain   ED Discharge Orders    None       Elpidio Anis, PA-C 06/10/18 0160    Derwood Kaplan, MD 06/10/18 [phone]

## 2018-06-10 NOTE — ED Notes (Signed)
Diaper checked   Clean and dry

## 2018-06-10 NOTE — ED Notes (Addendum)
[facility] arrived to transport patient. D/c documents given to [facility]. Patient is clean and ready for transport

## 2018-06-10 NOTE — ED Notes (Signed)
To ct and xray

## 2018-06-10 NOTE — ED Triage Notes (Signed)
The [facility] where he resides  He fell onc/o lt shoulder pain  He arrived in a c-collar from emsto the floor  No loc   Abrasions to his lt knee  Hx of dementia

## 2018-06-10 NOTE — ED Notes (Addendum)
Report called to [facility] [phone]) to give report.  Nurse-Sharon accepted report  [facility] called to transport

## 2018-06-10 NOTE — ED Notes (Addendum)
Patient's CBG is upto 63, he is alert, denies pain,  no sign of discomfort. Orange juice given, Breakfast given-patient is assisted to complete his meal Will continue to re-assess

## 2018-10-10 ENCOUNTER — Emergency Department (HOSPITAL_COMMUNITY)
Admission: EM | Admit: 2018-10-10 | Discharge: 2018-10-11 | Disposition: A | Discharge status: Home / Self Care | Payer: Medicare Other | Attending: Emergency Medicine | Admitting: Emergency Medicine

## 2018-10-10 ENCOUNTER — Encounter (HOSPITAL_COMMUNITY): Payer: Self-pay | Admitting: Family Medicine

## 2018-10-10 ENCOUNTER — Emergency Department (HOSPITAL_COMMUNITY): Payer: Medicare Other

## 2018-10-10 DIAGNOSIS — Z79899 Other long term (current) drug therapy: Secondary | ICD-10-CM | POA: Diagnosis not present

## 2018-10-10 DIAGNOSIS — Z794 Long term (current) use of insulin: Secondary | ICD-10-CM | POA: Diagnosis not present

## 2018-10-10 DIAGNOSIS — I10 Essential (primary) hypertension: Secondary | ICD-10-CM | POA: Diagnosis not present

## 2018-10-10 DIAGNOSIS — I2510 Atherosclerotic heart disease of native coronary artery without angina pectoris: Secondary | ICD-10-CM | POA: Insufficient documentation

## 2018-10-10 DIAGNOSIS — Z7982 Long term (current) use of aspirin: Secondary | ICD-10-CM | POA: Insufficient documentation

## 2018-10-10 DIAGNOSIS — U071 COVID-19: Secondary | ICD-10-CM | POA: Insufficient documentation

## 2018-10-10 DIAGNOSIS — Z87891 Personal history of nicotine dependence: Secondary | ICD-10-CM | POA: Diagnosis not present

## 2018-10-10 DIAGNOSIS — Z8673 Personal history of transient ischemic attack (TIA), and cerebral infarction without residual deficits: Secondary | ICD-10-CM | POA: Insufficient documentation

## 2018-10-10 DIAGNOSIS — Z951 Presence of aortocoronary bypass graft: Secondary | ICD-10-CM | POA: Diagnosis not present

## 2018-10-10 DIAGNOSIS — R509 Fever, unspecified: Secondary | ICD-10-CM | POA: Diagnosis present

## 2018-10-10 LAB — CBC WITH DIFFERENTIAL/PLATELET
Abs Immature Granulocytes: 0.05 10*3/uL (ref 0.00–0.07)
Basophils Absolute: 0.1 10*3/uL (ref 0.0–0.1)
Basophils Relative: 1 %
Eosinophils Absolute: 0 10*3/uL (ref 0.0–0.5)
Eosinophils Relative: 0 %
HCT: 49.6 % (ref 39.0–52.0)
Hemoglobin: 15.1 g/dL (ref 13.0–17.0)
Immature Granulocytes: 1 %
Lymphocytes Relative: 25 %
Lymphs Abs: 1.8 10*3/uL (ref 0.7–4.0)
MCH: 22.2 pg — ABNORMAL LOW (ref 26.0–34.0)
MCHC: 30.4 g/dL (ref 30.0–36.0)
MCV: 72.8 fL — ABNORMAL LOW (ref 80.0–100.0)
Monocytes Absolute: 1.3 10*3/uL — ABNORMAL HIGH (ref 0.1–1.0)
Monocytes Relative: 18 %
Neutro Abs: 4.1 10*3/uL (ref 1.7–7.7)
Neutrophils Relative %: 55 %
Platelets: 216 10*3/uL (ref 150–400)
RBC: 6.81 MIL/uL — ABNORMAL HIGH (ref 4.22–5.81)
RDW: 18.1 % — ABNORMAL HIGH (ref 11.5–15.5)
WBC: 7.4 10*3/uL (ref 4.0–10.5)
nRBC: 0 % (ref 0.0–0.2)

## 2018-10-10 LAB — COMPREHENSIVE METABOLIC PANEL
ALT: 21 U/L (ref 0–44)
AST: 28 U/L (ref 15–41)
Albumin: 3.8 g/dL (ref 3.5–5.0)
Alkaline Phosphatase: 73 U/L (ref 38–126)
Anion gap: 11 (ref 5–15)
BUN: 19 mg/dL (ref 8–23)
CO2: 21 mmol/L — ABNORMAL LOW (ref 22–32)
Calcium: 8.8 mg/dL — ABNORMAL LOW (ref 8.9–10.3)
Chloride: 103 mmol/L (ref 98–111)
Creatinine, Ser: 1.28 mg/dL — ABNORMAL HIGH (ref 0.61–1.24)
GFR calc Af Amer: >60 mL/min (ref >60)
GFR calc non Af Amer: 54 mL/min — ABNORMAL LOW (ref >60)
Glucose, Bld: 134 mg/dL — ABNORMAL HIGH (ref 70–99)
Potassium: 3.7 mmol/L (ref 3.5–5.1)
Sodium: 135 mmol/L (ref 135–145)
Total Bilirubin: 0.2 mg/dL — ABNORMAL LOW (ref 0.3–1.2)
Total Protein: 7.5 g/dL (ref 6.5–8.1)

## 2018-10-10 LAB — SARS CORONAVIRUS 2 BY RT PCR (HOSPITAL ORDER, PERFORMED IN ~~LOC~~ HOSPITAL LAB): SARS Coronavirus 2: POSITIVE — AB

## 2018-10-10 LAB — LACTIC ACID, PLASMA: Lactic Acid, Venous: 1.8 mmol/L (ref 0.5–1.9)

## 2018-10-10 NOTE — ED Notes (Signed)
Bed: WA04 Expected date:  Expected time:  Means of arrival:  Comments: 78 y/o F, [place], fever?

## 2018-10-10 NOTE — ED Notes (Signed)
Date and time results received: 10/10/18 2131 (use smartphrase ".now" to insert current time)  Test: covid Critical Value: Positive  Name of Provider Notified: Ranae Palms MD  Orders Received? Or Actions Taken?: no action

## 2018-10-10 NOTE — ED Notes (Signed)
Reviewed discharge instructions with Anadia, LPN at [facility].

## 2018-10-10 NOTE — ED Triage Notes (Signed)
Patient is from [facility] and transported via [facility]. According to EMS, patient has had a fever all day. When EMS checked it was 99.44F. Also, facility had patient on oxygen even though he usually does not wear it. EMS removed and patient has maintained oxygen level.

## 2018-10-10 NOTE — ED Notes (Signed)
Notified [facility] for transportation back to [facility].

## 2018-10-10 NOTE — ED Provider Notes (Signed)
[facility]-EMERGENCY DEPT Provider Note   CSN: [id number] Arrival date & time: 10/10/18  1921    History   Chief Complaint Chief Complaint  Patient presents with  . Fever    HPI Ian [name] is a 78 y.o. male.     HPI Patient coming from [facility] for fever today.  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  He is unable to contribute to history.  Per EMS patient was initially placed on oxygen by staff but he was maintaining oxygen saturations in the mid 90s on room air.  CBG and vital signs stable en route. Past Medical History:  Diagnosis Date  . BPH (benign prostatic hyperplasia)   . CAD (coronary artery disease)    LIMA to OM2, SVG to OM1, SVG to RCA  . HLD (hyperlipidemia)   . HTN (hypertension)   . Stroke [facility])     Patient Active Problem List   Diagnosis Date Noted  . Syncope and collapse 07/04/2014  . Acute renal failure (HCC) 07/04/2014  . Bradycardia 07/04/2014  . Dehydration 07/04/2014  . Abnormal urinalysis 07/04/2014  . Confusion with non-focal neuro exam   . HLD (hyperlipidemia)   . 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 History of CVA (cerebrovascular accident) 06/08/14 06/08/2014  . Hypertension 06/07/2014  . Alcohol abuse 06/07/2014  . Tobacco abuse 09/17/2010  . CAD (coronary artery disease)   . BENIGN PROSTATIC HYPERTROPHY 07/17/2010    Past Surgical History:  Procedure Laterality Date  . CORONARY ARTERY BYPASS GRAFT  2001  . HERNIA REPAIR          Home Medications    Prior to Admission medications   Medication Sig Start Date End Date Taking? Authorizing Provider  amLODipine (NORVASC) 2.5 MG tablet Take 2.5 mg by mouth daily. 09/16/18  Yes [provider]  aspirin EC 81 MG EC tablet Take 1 tablet (81 mg total) by mouth daily. 06/11/14  Yes Elgergawy, Leana Roe, MD  carvedilol (COREG) 3.125 MG tablet Take 1 tablet (3.125 mg total) by mouth 2 (two)  times daily with a meal. 07/08/14  Yes Albertine Grates, MD  cholecalciferol (VITAMIN D3) 25 MCG (1000 UT) tablet Take 1,000 Units by mouth daily.   Yes [provider]  clopidogrel (PLAVIX) 75 MG tablet Take 1 tablet (75 mg total) by mouth daily. 06/11/14  Yes Elgergawy, Leana Roe, MD  donepezil (ARICEPT) 10 MG tablet Take 10 mg by mouth at bedtime.   Yes [provider]  insulin glargine (LANTUS) 100 UNIT/ML injection Inject 15 Units into the skin at bedtime.   Yes [provider]  loratadine (CLARITIN) 10 MG tablet Take 10 mg by mouth daily.   Yes [provider]  metFORMIN (GLUCOPHAGE-XR) 500 MG 24 hr tablet Take 500 mg by mouth 2 (two) times daily.   Yes [provider]  mirtazapine (REMERON) 15 MG tablet Take 15 mg by mouth at bedtime. 09/28/18  Yes [provider]  Nutritional Supplements (RESOURCE 2.0 PO) Take 120 mLs by mouth 2 (two) times daily. With Med [employer]   Yes [provider]  sertraline (ZOLOFT) 50 MG tablet Take 50 mg by mouth daily.   Yes [provider]  simvastatin (ZOCOR) 40 MG tablet Take 1 tablet (40 mg total) by mouth at bedtime. 08/17/10  Yes Etta Grandchild, MD  thiamine (VITAMIN B-1) 100 MG tablet Take 2 tablets (200 mg total) by mouth  daily. 06/11/14  Yes Elgergawy, Leana Roe, MD  Tiotropium Bromide Monohydrate (SPIRIVA RESPIMAT) 2.5 MCG/ACT AERS Inhale 2 puffs into the lungs daily.   Yes [provider]  traZODone (DESYREL) 50 MG tablet Take 50 mg by mouth at bedtime.   Yes [provider]  acetaminophen (TYLENOL) 325 MG tablet Take 1 tablet (325 mg total) by mouth every 6 (six) hours as needed for mild pain (or Fever >/= 101). Patient not taking: Reported on 06/10/2018 07/07/14   Albertine Grates, MD  albuterol (PROVENTIL) (2.5 MG/3ML) 0.083% nebulizer solution Take 2.5 mg by nebulization every 6 (six) hours as needed for wheezing. sob 02/14/15   [provider]  albuterol (VENTOLIN HFA) 108 (90  Base) MCG/ACT inhaler Inhale 1-2 puffs into the lungs daily as needed for wheezing. 10/10/18   [provider]  doxycycline (VIBRAMYCIN) 100 MG capsule Take 100 mg by mouth 2 (two) times daily. 10/10/18   [provider]  FLUoxetine (PROZAC) 20 MG tablet Take 1 tablet (20 mg total) by mouth daily. Patient not taking: Reported on 06/10/2018 08/17/10   Etta Grandchild, MD  folic acid (FOLVITE) 1 MG tablet Take 1 tablet (1 mg total) by mouth daily. Patient not taking: Reported on 10/10/2018 06/11/14   Elgergawy, Leana Roe, MD  guaiFENesin (MUCINEX) 600 MG 12 hr tablet Take 1 tablet (600 mg total) by mouth 2 (two) times daily. Patient not taking: Reported on 06/10/2018 07/08/14   Albertine Grates, MD  mirtazapine (REMERON) 7.5 MG tablet Take 7.5 mg by mouth at bedtime.    [provider]  Multiple Vitamins-Minerals (MULTIVITAMIN) tablet Take 1 tablet by mouth daily. Patient not taking: Reported on 10/10/2018 06/11/14   Elgergawy, Leana Roe, MD  predniSONE (DELTASONE) 20 MG tablet Take 20 mg by mouth See admin instructions. 10/10/18   [provider]  senna-docusate (SENOKOT-S) 8.6-50 MG per tablet Take 2 tablets by mouth at bedtime. Patient not taking: Reported on 10/10/2018 07/07/14   Albertine Grates, MD    Family History Family History  Problem Relation Age of Onset  . Heart attack Mother   . Heart attack Father   . Arthritis Other   . Hypertension Other   . Stroke Other     Social History Social History   Tobacco Use  . Smoking status: Former Smoker    Packs/day: 0.50    Years: 40.00    Pack years: 20.00    Types: Cigarettes  . Smokeless tobacco: Never Used  Substance Use Topics  . Alcohol use: Not Currently    Alcohol/week: 5.0 standard drinks    Types: 5 Shots of liquor per week  . Drug use: No     Allergies   Patient has no known allergies.   Review of Systems Review of Systems  Unable to perform ROS: Patient nonverbal     Physical Exam Updated Vital  Signs BP 124/78   Pulse 84   Temp 100.3 F (37.9 C) (Oral)   Resp 19   Ht  (1.651 m)   SpO2 100%   BMI 31.62 kg/m   Physical Exam Vitals signs and nursing note reviewed.  Constitutional:      Appearance: Normal appearance. He is well-developed.  HENT:     Head: Normocephalic and atraumatic.     Mouth/Throat:     Mouth: Mucous membranes are moist.  Eyes:     Pupils: Pupils are equal, round, and reactive to light.  Neck:     Musculoskeletal: Normal range  of motion and neck supple. No neck rigidity or muscular tenderness.     Comments: No meningismus Cardiovascular:     Rate and Rhythm: Regular rhythm. Tachycardia present.     Heart sounds: No murmur. No friction rub. No gallop.   Pulmonary:     Effort: Pulmonary effort is normal. No respiratory distress.     Breath sounds: Normal breath sounds. No stridor. No wheezing, rhonchi or rales.  Chest:     Chest wall: No tenderness.  Abdominal:     General: Bowel sounds are normal. There is no distension.     Palpations: Abdomen is soft.     Tenderness: There is no abdominal tenderness. There is no guarding or rebound.  Musculoskeletal: Normal range of motion.        General: No swelling, tenderness, deformity or signs of injury.     Right lower leg: No edema.     Left lower leg: No edema.  Lymphadenopathy:     Cervical: No cervical adenopathy.  Skin:    General: Skin is warm and dry.     Findings: No erythema or rash.  Neurological:     Mental Status: He is alert and oriented to person, place, and time.     Comments: Patient is nonverbal.  Following simple commands.  Bilateral grip strength intact and moving toes in both feet.  Psychiatric:        Behavior: Behavior normal.      ED Treatments / Results  Labs (all labs ordered are listed, but only abnormal results are displayed) Labs Reviewed  SARS CORONAVIRUS 2 (HOSPITAL ORDER, PERFORMED IN [facility]) - Abnormal; Notable for the following  components:      Result Value   SARS Coronavirus 2 POSITIVE (*)    All other components within normal limits  CBC WITH DIFFERENTIAL/PLATELET - Abnormal; Notable for the following components:   RBC 6.81 (*)    MCV 72.8 (*)    MCH 22.2 (*)    RDW 18.1 (*)    Monocytes Absolute 1.3 (*)    All other components within normal limits  COMPREHENSIVE METABOLIC PANEL - Abnormal; Notable for the following components:   CO2 21 (*)    Glucose, Bld 134 (*)    Creatinine, Ser 1.28 (*)    Calcium 8.8 (*)    Total Bilirubin 0.2 (*)    GFR calc non Af Amer 54 (*)    All other components within normal limits  CULTURE, BLOOD (ROUTINE X 2)  CULTURE, BLOOD (ROUTINE X 2)  LACTIC ACID, PLASMA  URINALYSIS, ROUTINE W REFLEX MICROSCOPIC    EKG None  Radiology Dg Chest Port 1 View  Result Date: 10/10/2018 CLINICAL DATA:  Fever for 24 hours. EXAM: PORTABLE CHEST 1 VIEW COMPARISON:  Single-view of the chest 07/04/2014. PA and lateral chest 06/08/2014. FINDINGS: The lungs are clear. Heart size is normal. No pneumothorax or pleural effusion. The patient is status post CABG. No acute or focal bony abnormality. IMPRESSION: No acute disease. Electronically Signed   By: Drusilla Kannerhomas  Dalessio M.D.   On: 10/10/2018 20:51    Procedures Procedures (including critical care time)  Medications Ordered in ED Medications - No data to display   Initial Impression / Assessment and Plan / ED Course  I have reviewed the triage vital signs and the nursing notes.  Pertinent labs & imaging results that were available during my care of the patient were reviewed by me and considered in my medical decision making (  see chart for details).       COVID screening is positive.  Vital signs are stable.  Patient is not requiring any oxygen.  No indication for admission at this point.  Will discharge back to nursing facility.  Final Clinical Impressions(s) / ED Diagnoses   Final diagnoses:  COVID-19 virus detected    ED  Discharge Orders    None       Loren Racer, MD 10/10/18 2246

## 2018-10-10 NOTE — ED Notes (Signed)
Condom cath placed on pt 

## 2018-10-15 LAB — CULTURE, BLOOD (ROUTINE X 2)
Culture: NO GROWTH
Special Requests: ADEQUATE

## 2018-10-26 ENCOUNTER — Other Ambulatory Visit: Payer: Self-pay

## 2018-10-26 ENCOUNTER — Emergency Department (HOSPITAL_COMMUNITY): Payer: Medicare Other

## 2018-10-26 ENCOUNTER — Encounter (HOSPITAL_COMMUNITY): Payer: Self-pay

## 2018-10-26 ENCOUNTER — Emergency Department (HOSPITAL_COMMUNITY)
Admission: EM | Admit: 2018-10-26 | Discharge: 2018-10-27 | Disposition: A | Discharge status: Home / Self Care | Payer: Medicare Other | Attending: Emergency Medicine | Admitting: Emergency Medicine

## 2018-10-26 DIAGNOSIS — Z043 Encounter for examination and observation following other accident: Secondary | ICD-10-CM | POA: Diagnosis present

## 2018-10-26 DIAGNOSIS — Z87891 Personal history of nicotine dependence: Secondary | ICD-10-CM | POA: Insufficient documentation

## 2018-10-26 DIAGNOSIS — Y939 Activity, unspecified: Secondary | ICD-10-CM | POA: Insufficient documentation

## 2018-10-26 DIAGNOSIS — Z79899 Other long term (current) drug therapy: Secondary | ICD-10-CM | POA: Insufficient documentation

## 2018-10-26 DIAGNOSIS — Y9210 Unspecified residential institution as the place of occurrence of the external cause: Secondary | ICD-10-CM | POA: Diagnosis not present

## 2018-10-26 DIAGNOSIS — I10 Essential (primary) hypertension: Secondary | ICD-10-CM | POA: Insufficient documentation

## 2018-10-26 DIAGNOSIS — Y999 Unspecified external cause status: Secondary | ICD-10-CM | POA: Diagnosis not present

## 2018-10-26 DIAGNOSIS — W19XXXA Unspecified fall, initial encounter: Secondary | ICD-10-CM | POA: Insufficient documentation

## 2018-10-26 DIAGNOSIS — F0390 Unspecified dementia without behavioral disturbance: Secondary | ICD-10-CM | POA: Diagnosis not present

## 2018-10-26 DIAGNOSIS — I2510 Atherosclerotic heart disease of native coronary artery without angina pectoris: Secondary | ICD-10-CM | POA: Diagnosis not present

## 2018-10-26 DIAGNOSIS — Z7901 Long term (current) use of anticoagulants: Secondary | ICD-10-CM | POA: Insufficient documentation

## 2018-10-26 DIAGNOSIS — Z8673 Personal history of transient ischemic attack (TIA), and cerebral infarction without residual deficits: Secondary | ICD-10-CM | POA: Insufficient documentation

## 2018-10-26 LAB — CBC WITH DIFFERENTIAL/PLATELET
Abs Immature Granulocytes: 0.3 10*3/uL — ABNORMAL HIGH (ref 0.00–0.07)
Basophils Absolute: 0.1 10*3/uL (ref 0.0–0.1)
Basophils Relative: 1 %
Eosinophils Absolute: 0.1 10*3/uL (ref 0.0–0.5)
Eosinophils Relative: 1 %
HCT: 48.9 % (ref 39.0–52.0)
Hemoglobin: 14.9 g/dL (ref 13.0–17.0)
Immature Granulocytes: 3 %
Lymphocytes Relative: 14 %
Lymphs Abs: 1.7 10*3/uL (ref 0.7–4.0)
MCH: 21.9 pg — ABNORMAL LOW (ref 26.0–34.0)
MCHC: 30.5 g/dL (ref 30.0–36.0)
MCV: 71.8 fL — ABNORMAL LOW (ref 80.0–100.0)
Monocytes Absolute: 1.8 10*3/uL — ABNORMAL HIGH (ref 0.1–1.0)
Monocytes Relative: 15 %
Neutro Abs: 8.2 10*3/uL — ABNORMAL HIGH (ref 1.7–7.7)
Neutrophils Relative %: 66 %
Platelets: 707 10*3/uL — ABNORMAL HIGH (ref 150–400)
RBC: 6.81 MIL/uL — ABNORMAL HIGH (ref 4.22–5.81)
RDW: 17.6 % — ABNORMAL HIGH (ref 11.5–15.5)
WBC: 12.1 10*3/uL — ABNORMAL HIGH (ref 4.0–10.5)
nRBC: 0 % (ref 0.0–0.2)

## 2018-10-26 LAB — BASIC METABOLIC PANEL
Anion gap: 12 (ref 5–15)
BUN: 18 mg/dL (ref 8–23)
CO2: 25 mmol/L (ref 22–32)
Calcium: 10.4 mg/dL — ABNORMAL HIGH (ref 8.9–10.3)
Chloride: 108 mmol/L (ref 98–111)
Creatinine, Ser: 1.04 mg/dL (ref 0.61–1.24)
GFR calc Af Amer: >60 mL/min (ref >60)
GFR calc non Af Amer: >60 mL/min (ref >60)
Glucose, Bld: 122 mg/dL — ABNORMAL HIGH (ref 70–99)
Potassium: 4 mmol/L (ref 3.5–5.1)
Sodium: 145 mmol/L (ref 135–145)

## 2018-10-26 LAB — PROTIME-INR
INR: 1.3 — ABNORMAL HIGH (ref 0.8–1.2)
Prothrombin Time: 16 seconds — ABNORMAL HIGH (ref 11.4–15.2)

## 2018-10-26 NOTE — ED Triage Notes (Signed)
[facility] reports pt from [facility] with unwitnessed fall. No complaints or obvious injuries. Pt on plavix. VS stable

## 2018-10-26 NOTE — ED Provider Notes (Signed)
[facility] Provider Note   CSN: [id number] Arrival date & time: 10/26/18  1907    History   Chief Complaint Chief Complaint  Patient presents with  . Fall    HPI Ian [name] is a 78 y.o. male.     The history is provided by the patient, the EMS personnel and medical records.  Fall This is a new problem. The current episode started 3 to 5 hours ago. The problem occurs constantly. The problem has not changed since onset.Pertinent negatives include no headaches and no shortness of breath. Nothing aggravates the symptoms. Nothing relieves the symptoms. He has tried rest, a warm compress, a cold compress and water for the symptoms. The treatment provided no relief.    Past Medical History:  Diagnosis Date  . BPH (benign prostatic hyperplasia)   . CAD (coronary artery disease)    LIMA to OM2, SVG to OM1, SVG to RCA  . HLD (hyperlipidemia)   . HTN (hypertension)   . Stroke [facility](HCC)     Patient Active Problem List   Diagnosis Date Noted  . Syncope and collapse 07/04/2014  . Acute renal failure (HCC) 07/04/2014  . Bradycardia 07/04/2014  . Dehydration 07/04/2014  . Abnormal urinalysis 07/04/2014  . Confusion with non-focal neuro exam   . HLD (hyperlipidemia)   . 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 History of CVA (cerebrovascular accident) 06/08/14 06/08/2014  . Hypertension 06/07/2014  . Alcohol abuse 06/07/2014  . Tobacco abuse 09/17/2010  . CAD (coronary artery disease)   . BENIGN PROSTATIC HYPERTROPHY 07/17/2010    Past Surgical History:  Procedure Laterality Date  . CORONARY ARTERY BYPASS GRAFT  2001  . HERNIA REPAIR          Home Medications    Prior to Admission medications   Medication Sig Start Date End Date Taking? Authorizing Provider  acetaminophen (TYLENOL) 325 MG tablet Take 1 tablet (325 mg total) by mouth every 6 (six) hours as needed for mild pain (or Fever >/= 101). Patient  taking differently: Take 650 mg by mouth every 6 (six) hours as needed for mild pain (or Fever >/= 101). For osteoarthritis 07/07/14  Yes Albertine GratesXu, Fang, MD  albuterol (VENTOLIN HFA) 108 (90 Base) MCG/ACT inhaler Inhale 2 puffs into the lungs 4 (four) times daily.  10/10/18  Yes [provider]  amLODipine (NORVASC) 2.5 MG tablet Take 2.5 mg by mouth daily. 09/16/18  Yes [provider]  aspirin EC 81 MG EC tablet Take 1 tablet (81 mg total) by mouth daily. 06/11/14  Yes Elgergawy, Leana Roeawood S, MD  carvedilol (COREG) 3.125 MG tablet Take 1 tablet (3.125 mg total) by mouth 2 (two) times daily with a meal. 07/08/14  Yes Albertine GratesXu, Fang, MD  cholecalciferol (VITAMIN D3) 25 MCG (1000 UT) tablet Take 1,000 Units by mouth daily.   Yes [provider]  clopidogrel (PLAVIX) 75 MG tablet Take 1 tablet (75 mg total) by mouth daily. 06/11/14  Yes Elgergawy, Leana Roeawood S, MD  donepezil (ARICEPT) 10 MG tablet Take 10 mg by mouth at bedtime.   Yes [provider]  loratadine (CLARITIN) 10 MG tablet Take 10 mg by mouth daily.   Yes [provider]  mirtazapine (REMERON) 15 MG tablet Take 15 mg by mouth at bedtime. 09/28/18  Yes [provider]  Probiotic Product (PROBIOTIC PO) Take 1 tablet by mouth daily.   Yes [provider]  senna (SENOKOT) 8.6 MG TABS tablet  Take 2 tablets by mouth daily as needed for mild constipation.   Yes [provider]  sertraline (ZOLOFT) 50 MG tablet Take 50 mg by mouth daily.   Yes [provider]  simvastatin (ZOCOR) 40 MG tablet Take 1 tablet (40 mg total) by mouth at bedtime. 08/17/10  Yes Janith Lima, MD  thiamine (VITAMIN B-1) 100 MG tablet Take 2 tablets (200 mg total) by mouth daily. 06/11/14  Yes Elgergawy, Silver Huguenin, MD  Tiotropium Bromide Monohydrate (SPIRIVA RESPIMAT) 2.5 MCG/ACT AERS Inhale 2 puffs into the lungs daily.   Yes [provider]  traZODone (DESYREL) 50 MG tablet Take 25 mg by mouth at bedtime.     Yes [provider]  albuterol (PROVENTIL) (2.5 MG/3ML) 0.083% nebulizer solution Take 2.5 mg by nebulization every 6 (six) hours as needed for wheezing. sob 02/14/15   [provider]  FLUoxetine (PROZAC) 20 MG tablet Take 1 tablet (20 mg total) by mouth daily. Patient not taking: Reported on 06/10/2018 08/17/10   Janith Lima, MD  folic acid (FOLVITE) 1 MG tablet Take 1 tablet (1 mg total) by mouth daily. Patient not taking: Reported on 10/10/2018 06/11/14   Elgergawy, Silver Huguenin, MD  guaiFENesin (MUCINEX) 600 MG 12 hr tablet Take 1 tablet (600 mg total) by mouth 2 (two) times daily. Patient not taking: Reported on 06/10/2018 07/08/14   Florencia Reasons, MD  levofloxacin (LEVAQUIN) 750 MG tablet Take 750 mg by mouth daily. For 7 days 10/20/18 10/26/18  [provider]  Multiple Vitamins-Minerals (MULTIVITAMIN) tablet Take 1 tablet by mouth daily. Patient not taking: Reported on 10/10/2018 06/11/14   Elgergawy, Silver Huguenin, MD  Nutritional Supplements (RESOURCE 2.0 PO) Take 120 mLs by mouth 2 (two) times daily. With [employer]    [provider]  senna-docusate (SENOKOT-S) 8.6-50 MG per tablet Take 2 tablets by mouth at bedtime. Patient not taking: Reported on 10/26/2018 07/07/14   Florencia Reasons, MD    Family History Family History  Problem Relation Age of Onset  . Heart attack Mother   . Heart attack Father   . Arthritis Other   . Hypertension Other   . Stroke Other     Social History Social History   Tobacco Use  . Smoking status: Former Smoker    Packs/day: 0.50    Years: 40.00    Pack years: 20.00    Types: Cigarettes  . Smokeless tobacco: Never Used  Substance Use Topics  . Alcohol use: Not Currently    Alcohol/week: 5.0 standard drinks    Types: 5 Shots of liquor per week  . Drug use: No     Allergies   Patient has no known allergies.   Review of Systems Review of Systems  Respiratory: Negative for shortness of breath.   Neurological: Negative for  headaches.  All other systems reviewed and are negative.    Physical Exam Updated Vital Signs BP (!) 149/110   Pulse 75   Temp 98.4 F (36.9 C) (Oral)   Resp (!) 27   SpO2 91%   Physical Exam Vitals signs and nursing note reviewed.  Constitutional:      Appearance: He is well-developed.  HENT:     Head: Normocephalic.     Comments: No obvious depressions concerning for skull fracture  No obvious abrasions or lacerations head or neck  No obvious bruising, swellings face, head, neck  No obvious injury to oropharynx Eyes:     Conjunctiva/sclera: Conjunctivae normal.  Neck:  Musculoskeletal: Neck supple. No muscular tenderness.     Comments: No midline tenderness cervical spine with palpation No obvious step-offs or deformities cervical spine C-collar in place No midline thoracic or lumbar spine tenderness with palpation No obvious step-offs or deformities thoracic or lumbar spine  Cardiovascular:     Rate and Rhythm: Normal rate.  Pulmonary:     Effort: Pulmonary effort is normal. No respiratory distress.  Abdominal:     Palpations: Abdomen is soft.     Tenderness: There is no abdominal tenderness.  Musculoskeletal:        General: No tenderness or deformity.  Skin:    General: Skin is warm and dry.  Neurological:     Mental Status: He is alert.     Comments: Patient hard of hearing, alert and interactive, not oriented given history of dementia, patient follows commands appropriately, GCS 15      ED Treatments / Results  Labs (all labs ordered are listed, but only abnormal results are displayed) Labs Reviewed  CBC WITH DIFFERENTIAL/PLATELET - Abnormal; Notable for the following components:      Result Value   WBC 12.1 (*)    RBC 6.81 (*)    MCV 71.8 (*)    MCH 21.9 (*)    RDW 17.6 (*)    Platelets 707 (*)    Neutro Abs 8.2 (*)    Monocytes Absolute 1.8 (*)    Abs Immature Granulocytes 0.30 (*)    All other components within normal limits  BASIC  METABOLIC PANEL - Abnormal; Notable for the following components:   Glucose, Bld 122 (*)    Calcium 10.4 (*)    All other components within normal limits  PROTIME-INR - Abnormal; Notable for the following components:   Prothrombin Time 16.0 (*)    INR 1.3 (*)    All other components within normal limits    EKG None  Radiology Ct Head Wo Contrast  Result Date: 10/26/2018 CLINICAL DATA:  Unwitnessed fall. EXAM: CT HEAD WITHOUT CONTRAST CT MAXILLOFACIAL WITHOUT CONTRAST CT CERVICAL SPINE WITHOUT CONTRAST TECHNIQUE: Multidetector CT imaging of the head, cervical spine, and maxillofacial structures were performed using the standard protocol without intravenous contrast. Multiplanar CT image reconstructions of the cervical spine and maxillofacial structures were also generated. COMPARISON:  CT scan of June 10, 2018. FINDINGS: CT HEAD FINDINGS Brain: Moderate diffuse cortical atrophy is noted. Mild chronic ischemic white matter disease is noted. Old left temporal encephalomalacia is noted consistent with old infarction. No mass effect or midline shift is noted. Ventricular size is within normal limits. There is no evidence of mass lesion, hemorrhage or acute infarction. Vascular: No hyperdense vessel or unexpected calcification. Skull: Normal. Negative for fracture or focal lesion. Other: None. CT MAXILLOFACIAL FINDINGS Osseous: No fracture or mandibular dislocation. No destructive process. Orbits: Negative. No traumatic or inflammatory finding. Sinuses: Clear. Soft tissues: Negative. CT CERVICAL SPINE FINDINGS Alignment: Grade 1 retrolisthesis of C3-4 is noted secondary to severe degenerative disc disease at this level. Skull base and vertebrae: No acute fracture. No primary bone lesion or focal pathologic process. Soft tissues and spinal canal: No prevertebral fluid or swelling. No visible canal hematoma. Disc levels: Severe degenerative disc disease is also noted at C5-6, C6-7 and C7-T1. Upper chest:  Negative. Other: Degenerative changes are seen involving posterior facet joints bilaterally. IMPRESSION: Moderate diffuse cortical atrophy. Mild chronic ischemic white matter disease. No acute intracranial abnormality seen. No definite abnormality seen in maxillofacial region. Severe multilevel degenerative disc disease.  No acute abnormality seen in the cervical spine. Electronically Signed   By: Lupita RaiderJames  Green Jr M.D.   On: 10/26/2018 21:03   Ct Cervical Spine Wo Contrast  Result Date: 10/26/2018 CLINICAL DATA:  Unwitnessed fall. EXAM: CT HEAD WITHOUT CONTRAST CT MAXILLOFACIAL WITHOUT CONTRAST CT CERVICAL SPINE WITHOUT CONTRAST TECHNIQUE: Multidetector CT imaging of the head, cervical spine, and maxillofacial structures were performed using the standard protocol without intravenous contrast. Multiplanar CT image reconstructions of the cervical spine and maxillofacial structures were also generated. COMPARISON:  CT scan of June 10, 2018. FINDINGS: CT HEAD FINDINGS Brain: Moderate diffuse cortical atrophy is noted. Mild chronic ischemic white matter disease is noted. Old left temporal encephalomalacia is noted consistent with old infarction. No mass effect or midline shift is noted. Ventricular size is within normal limits. There is no evidence of mass lesion, hemorrhage or acute infarction. Vascular: No hyperdense vessel or unexpected calcification. Skull: Normal. Negative for fracture or focal lesion. Other: None. CT MAXILLOFACIAL FINDINGS Osseous: No fracture or mandibular dislocation. No destructive process. Orbits: Negative. No traumatic or inflammatory finding. Sinuses: Clear. Soft tissues: Negative. CT CERVICAL SPINE FINDINGS Alignment: Grade 1 retrolisthesis of C3-4 is noted secondary to severe degenerative disc disease at this level. Skull base and vertebrae: No acute fracture. No primary bone lesion or focal pathologic process. Soft tissues and spinal canal: No prevertebral fluid or swelling. No  visible canal hematoma. Disc levels: Severe degenerative disc disease is also noted at C5-6, C6-7 and C7-T1. Upper chest: Negative. Other: Degenerative changes are seen involving posterior facet joints bilaterally. IMPRESSION: Moderate diffuse cortical atrophy. Mild chronic ischemic white matter disease. No acute intracranial abnormality seen. No definite abnormality seen in maxillofacial region. Severe multilevel degenerative disc disease. No acute abnormality seen in the cervical spine. Electronically Signed   By: Lupita RaiderJames  Green Jr M.D.   On: 10/26/2018 21:03   Ct Maxillofacial Wo Contrast  Result Date: 10/26/2018 CLINICAL DATA:  Unwitnessed fall. EXAM: CT HEAD WITHOUT CONTRAST CT MAXILLOFACIAL WITHOUT CONTRAST CT CERVICAL SPINE WITHOUT CONTRAST TECHNIQUE: Multidetector CT imaging of the head, cervical spine, and maxillofacial structures were performed using the standard protocol without intravenous contrast. Multiplanar CT image reconstructions of the cervical spine and maxillofacial structures were also generated. COMPARISON:  CT scan of June 10, 2018. FINDINGS: CT HEAD FINDINGS Brain: Moderate diffuse cortical atrophy is noted. Mild chronic ischemic white matter disease is noted. Old left temporal encephalomalacia is noted consistent with old infarction. No mass effect or midline shift is noted. Ventricular size is within normal limits. There is no evidence of mass lesion, hemorrhage or acute infarction. Vascular: No hyperdense vessel or unexpected calcification. Skull: Normal. Negative for fracture or focal lesion. Other: None. CT MAXILLOFACIAL FINDINGS Osseous: No fracture or mandibular dislocation. No destructive process. Orbits: Negative. No traumatic or inflammatory finding. Sinuses: Clear. Soft tissues: Negative. CT CERVICAL SPINE FINDINGS Alignment: Grade 1 retrolisthesis of C3-4 is noted secondary to severe degenerative disc disease at this level. Skull base and vertebrae: No acute fracture. No  primary bone lesion or focal pathologic process. Soft tissues and spinal canal: No prevertebral fluid or swelling. No visible canal hematoma. Disc levels: Severe degenerative disc disease is also noted at C5-6, C6-7 and C7-T1. Upper chest: Negative. Other: Degenerative changes are seen involving posterior facet joints bilaterally. IMPRESSION: Moderate diffuse cortical atrophy. Mild chronic ischemic white matter disease. No acute intracranial abnormality seen. No definite abnormality seen in maxillofacial region. Severe multilevel degenerative disc disease. No acute abnormality seen in the cervical  spine. Electronically Signed   By: Lupita RaiderJames  Green Jr M.D.   On: 10/26/2018 21:03    Procedures Procedures (including critical care time)  Medications Ordered in ED Medications - No data to display   Initial Impression / Assessment and Plan / ED Course  I have reviewed the triage vital signs and the nursing notes.  Pertinent labs & imaging results that were available during my care of the patient were reviewed by me and considered in my medical decision making (see chart for details).        Medical Decision Making: Ian FlockDavid [name] is a 78 y.o. male who presented to the ED today status post fall.  Past medical history significant for history of hypertension, CVA, vascular dementia, BPH, hyperlipidemia Reviewed and confirmed nursing documentation for past medical history, family history, social history.  On my initial exam, the pt was calm, cooperative, conversant, follows commands appropriately, GCS 15, not tachycardic, not hypotensive, afebrile, no increased work of breathing or respiratory distress, no signs of impending respiratory failure.  Known COVID positive from a few weeks ago.  Has grossly recovered from previous respiratory illness.  Per EMS, patient is at facility, is supposed to use wheelchair at all times, patient was found in the floor next to wheelchair, history of dementia, at neuro  and mental baseline per EMS and facility staff, patient on aspirin, Plavix, otherwise no systemic anticoagulation, patient not endorsing any pain, no focal tenderness, however given history of dementia, will obtain CT imaging to assess for traumatic injury  All radiology and laboratory studies reviewed independently and with my attending physician, agree with reading provided by radiologist unless otherwise noted.   CT imaging revealed no acute fracture, dislocation, no acute bleed Hemoglobin 14.9, creatinine 1.0, sodium 145, potassium 4.0  EKG (my interpretation):      NS rhythm with a rate of  77.      QRS 85. QTc 518. PR 113.      No acute ischemic ST-T segment changes.      No acute changes suggestive of hyperkalemia.      No WPW, LQTS, or Brugada's Syndrome. When compared to previous ECGs from 06/12/18 change no new concerning changes found.  Upon reassessing patient, patient was calm, resting comfortably Based on the above findings, I believe patient is hemodynamically stable for discharge.  Patient educated about specific return precautions for given chief complaint and symptoms.  Patient educated about follow-up with PCP.  Patient expressed understanding of return precautions and need for follow-up.  Patient discharged.  The above care was discussed with and agreed upon by my attending physician. Emergency Department Medication Summary:  Medications - No data to display  Final Clinical Impressions(s) / ED Diagnoses   Final diagnoses:  Fall, initial encounter    ED Discharge Orders    None       Erick Alleyasey, Jameil Whitmoyer, MD 10/26/18 2138    Derwood KaplanNanavati, Ankit, MD 10/28/18 1545

## 2018-10-26 NOTE — Discharge Instructions (Signed)

## 2018-10-26 NOTE — ED Notes (Signed)
[facility] called for pt 

## 2018-10-27 NOTE — ED Notes (Signed)
Pt resting quietly with no complaints.
